# Patient Record
Sex: Female | Born: 1950 | Race: White | Hispanic: No | Marital: Married | Smoking: Former smoker
Health system: Southern US, Community
[De-identification: ages and names within clinical notes are randomized; demographics above are authoritative.]

## PROBLEM LIST (undated history)

## (undated) DIAGNOSIS — M545 Low back pain, unspecified: Secondary | ICD-10-CM

## (undated) DIAGNOSIS — R519 Headache, unspecified: Secondary | ICD-10-CM

## (undated) DIAGNOSIS — K219 Gastro-esophageal reflux disease without esophagitis: Secondary | ICD-10-CM

## (undated) DIAGNOSIS — R51 Headache: Secondary | ICD-10-CM

## (undated) DIAGNOSIS — M1990 Unspecified osteoarthritis, unspecified site: Secondary | ICD-10-CM

## (undated) DIAGNOSIS — R55 Syncope and collapse: Secondary | ICD-10-CM

## (undated) DIAGNOSIS — R9431 Abnormal electrocardiogram [ECG] [EKG]: Secondary | ICD-10-CM

## (undated) DIAGNOSIS — I10 Essential (primary) hypertension: Secondary | ICD-10-CM

## (undated) HISTORY — DX: Unspecified osteoarthritis, unspecified site: M19.90

## (undated) HISTORY — PX: TONSILLECTOMY: SUR1361

## (undated) HISTORY — DX: Low back pain, unspecified: M54.50

## (undated) HISTORY — DX: Gastro-esophageal reflux disease without esophagitis: K21.9

## (undated) HISTORY — DX: Headache, unspecified: R51.9

## (undated) HISTORY — PX: COSMETIC SURGERY: SHX468

## (undated) HISTORY — PX: OTHER SURGICAL HISTORY: SHX169

## (undated) HISTORY — DX: Essential (primary) hypertension: I10

## (undated) HISTORY — DX: Low back pain: M54.5

## (undated) HISTORY — DX: Headache: R51

## (undated) HISTORY — PX: CATARACT EXTRACTION, BILATERAL: SHX1313

## (undated) HISTORY — DX: Syncope and collapse: R55

## (undated) HISTORY — PX: BREAST EXCISIONAL BIOPSY: SUR124

## (undated) HISTORY — DX: Abnormal electrocardiogram (ECG) (EKG): R94.31

---

## 1973-05-08 HISTORY — PX: OTHER SURGICAL HISTORY: SHX169

## 1999-01-06 ENCOUNTER — Other Ambulatory Visit: Admission: RE | Admit: 1999-01-06 | Discharge: 1999-01-06 | Payer: Self-pay | Admitting: Internal Medicine

## 1999-09-14 ENCOUNTER — Emergency Department (HOSPITAL_COMMUNITY): Admission: EM | Admit: 1999-09-14 | Discharge: 1999-09-14 | Payer: Self-pay

## 1999-09-14 ENCOUNTER — Encounter: Payer: Self-pay | Admitting: Emergency Medicine

## 2002-05-14 ENCOUNTER — Other Ambulatory Visit: Admission: RE | Admit: 2002-05-14 | Discharge: 2002-05-14 | Payer: Self-pay | Admitting: Internal Medicine

## 2004-05-16 ENCOUNTER — Ambulatory Visit: Payer: Self-pay | Admitting: Internal Medicine

## 2004-06-23 ENCOUNTER — Ambulatory Visit: Payer: Self-pay | Admitting: Internal Medicine

## 2004-08-23 ENCOUNTER — Ambulatory Visit: Payer: Self-pay | Admitting: Internal Medicine

## 2004-08-30 ENCOUNTER — Ambulatory Visit: Payer: Self-pay | Admitting: Internal Medicine

## 2004-10-04 ENCOUNTER — Emergency Department (HOSPITAL_COMMUNITY): Admission: EM | Admit: 2004-10-04 | Discharge: 2004-10-05 | Payer: Self-pay | Admitting: Emergency Medicine

## 2004-10-08 ENCOUNTER — Emergency Department (HOSPITAL_COMMUNITY): Admission: EM | Admit: 2004-10-08 | Discharge: 2004-10-08 | Payer: Self-pay | Admitting: Emergency Medicine

## 2004-10-24 ENCOUNTER — Ambulatory Visit: Payer: Self-pay | Admitting: Internal Medicine

## 2004-10-26 ENCOUNTER — Ambulatory Visit: Payer: Self-pay | Admitting: Internal Medicine

## 2005-01-24 ENCOUNTER — Ambulatory Visit: Payer: Self-pay | Admitting: Internal Medicine

## 2005-03-07 ENCOUNTER — Ambulatory Visit: Payer: Self-pay | Admitting: Internal Medicine

## 2005-05-22 ENCOUNTER — Ambulatory Visit: Payer: Self-pay | Admitting: Internal Medicine

## 2005-05-26 ENCOUNTER — Encounter: Admission: RE | Admit: 2005-05-26 | Discharge: 2005-05-26 | Payer: Self-pay | Admitting: Internal Medicine

## 2005-07-26 ENCOUNTER — Ambulatory Visit: Payer: Self-pay | Admitting: Internal Medicine

## 2005-10-03 ENCOUNTER — Ambulatory Visit: Payer: Self-pay | Admitting: Internal Medicine

## 2005-11-28 ENCOUNTER — Ambulatory Visit: Payer: Self-pay | Admitting: Internal Medicine

## 2006-01-09 ENCOUNTER — Ambulatory Visit: Payer: Self-pay | Admitting: Internal Medicine

## 2006-01-10 ENCOUNTER — Ambulatory Visit: Payer: Self-pay | Admitting: Internal Medicine

## 2006-01-11 ENCOUNTER — Ambulatory Visit: Payer: Self-pay | Admitting: Internal Medicine

## 2006-03-13 ENCOUNTER — Ambulatory Visit: Payer: Self-pay | Admitting: Internal Medicine

## 2006-03-13 LAB — CONVERTED CEMR LAB
ALT: 29 units/L (ref 0–40)
AST: 22 units/L (ref 0–37)
Albumin: 3.5 g/dL (ref 3.5–5.2)
BUN: 27 mg/dL — ABNORMAL HIGH (ref 6–23)
GFR calc non Af Amer: 61 mL/min
Glomerular Filtration Rate, Af Am: 73 mL/min/{1.73_m2}
Potassium: 3.8 meq/L (ref 3.5–5.1)
Sodium: 139 meq/L (ref 135–145)
Total Bilirubin: 0.8 mg/dL (ref 0.3–1.2)

## 2006-03-16 ENCOUNTER — Ambulatory Visit: Payer: Self-pay

## 2006-04-17 ENCOUNTER — Ambulatory Visit: Payer: Self-pay | Admitting: Internal Medicine

## 2006-05-24 ENCOUNTER — Ambulatory Visit: Payer: Self-pay | Admitting: Internal Medicine

## 2006-05-30 ENCOUNTER — Ambulatory Visit: Payer: Self-pay | Admitting: Internal Medicine

## 2006-06-21 ENCOUNTER — Ambulatory Visit: Payer: Self-pay | Admitting: Internal Medicine

## 2006-09-18 ENCOUNTER — Ambulatory Visit: Payer: Self-pay | Admitting: Internal Medicine

## 2006-10-04 ENCOUNTER — Ambulatory Visit: Payer: Self-pay | Admitting: Internal Medicine

## 2006-11-02 DIAGNOSIS — R609 Edema, unspecified: Secondary | ICD-10-CM

## 2006-11-02 DIAGNOSIS — K219 Gastro-esophageal reflux disease without esophagitis: Secondary | ICD-10-CM

## 2006-11-02 DIAGNOSIS — J45909 Unspecified asthma, uncomplicated: Secondary | ICD-10-CM

## 2006-11-02 DIAGNOSIS — I10 Essential (primary) hypertension: Secondary | ICD-10-CM

## 2006-11-20 ENCOUNTER — Ambulatory Visit: Payer: Self-pay | Admitting: Internal Medicine

## 2007-01-29 ENCOUNTER — Ambulatory Visit: Payer: Self-pay | Admitting: Internal Medicine

## 2007-01-29 DIAGNOSIS — M1990 Unspecified osteoarthritis, unspecified site: Secondary | ICD-10-CM | POA: Insufficient documentation

## 2007-01-29 DIAGNOSIS — M545 Low back pain: Secondary | ICD-10-CM | POA: Insufficient documentation

## 2007-01-29 LAB — CONVERTED CEMR LAB
GFR calc Af Amer: 59 mL/min
GFR calc non Af Amer: 49 mL/min
Glucose, Bld: 95 mg/dL (ref 70–99)
Magnesium: 2 mg/dL (ref 1.5–2.5)
Potassium: 4.8 meq/L (ref 3.5–5.1)
Sodium: 143 meq/L (ref 135–145)

## 2007-02-04 ENCOUNTER — Telehealth: Payer: Self-pay | Admitting: Internal Medicine

## 2007-02-21 ENCOUNTER — Ambulatory Visit: Payer: Self-pay | Admitting: Internal Medicine

## 2007-02-22 ENCOUNTER — Encounter: Payer: Self-pay | Admitting: Internal Medicine

## 2007-02-25 ENCOUNTER — Telehealth: Payer: Self-pay | Admitting: Internal Medicine

## 2007-03-19 ENCOUNTER — Telehealth: Payer: Self-pay | Admitting: Internal Medicine

## 2007-03-21 ENCOUNTER — Telehealth: Payer: Self-pay | Admitting: Internal Medicine

## 2007-04-29 ENCOUNTER — Ambulatory Visit: Payer: Self-pay | Admitting: Internal Medicine

## 2007-06-24 ENCOUNTER — Telehealth: Payer: Self-pay | Admitting: Internal Medicine

## 2007-06-25 ENCOUNTER — Ambulatory Visit: Payer: Self-pay | Admitting: Internal Medicine

## 2007-06-26 ENCOUNTER — Telehealth: Payer: Self-pay | Admitting: *Deleted

## 2007-07-05 ENCOUNTER — Encounter: Payer: Self-pay | Admitting: Internal Medicine

## 2007-07-30 ENCOUNTER — Ambulatory Visit: Payer: Self-pay | Admitting: Internal Medicine

## 2007-07-30 ENCOUNTER — Telehealth: Payer: Self-pay | Admitting: *Deleted

## 2007-08-29 ENCOUNTER — Emergency Department (HOSPITAL_COMMUNITY): Admission: EM | Admit: 2007-08-29 | Discharge: 2007-08-29 | Payer: Self-pay | Admitting: Emergency Medicine

## 2007-09-10 ENCOUNTER — Telehealth: Payer: Self-pay | Admitting: *Deleted

## 2007-10-01 ENCOUNTER — Telehealth (INDEPENDENT_AMBULATORY_CARE_PROVIDER_SITE_OTHER): Payer: Self-pay | Admitting: *Deleted

## 2007-10-31 ENCOUNTER — Ambulatory Visit: Payer: Self-pay | Admitting: Internal Medicine

## 2007-10-31 DIAGNOSIS — I4581 Long QT syndrome: Secondary | ICD-10-CM

## 2007-11-25 ENCOUNTER — Encounter: Payer: Self-pay | Admitting: Internal Medicine

## 2008-01-03 ENCOUNTER — Ambulatory Visit: Payer: Self-pay | Admitting: Internal Medicine

## 2008-02-19 ENCOUNTER — Ambulatory Visit: Payer: Self-pay | Admitting: Internal Medicine

## 2008-04-22 ENCOUNTER — Ambulatory Visit: Payer: Self-pay | Admitting: Internal Medicine

## 2008-06-23 ENCOUNTER — Ambulatory Visit: Payer: Self-pay | Admitting: Internal Medicine

## 2008-06-23 ENCOUNTER — Encounter: Payer: Self-pay | Admitting: Internal Medicine

## 2008-06-23 ENCOUNTER — Other Ambulatory Visit: Admission: RE | Admit: 2008-06-23 | Discharge: 2008-06-23 | Payer: Self-pay | Admitting: Internal Medicine

## 2008-06-23 DIAGNOSIS — G4700 Insomnia, unspecified: Secondary | ICD-10-CM | POA: Insufficient documentation

## 2008-06-23 LAB — HM PAP SMEAR

## 2008-07-07 ENCOUNTER — Telehealth: Payer: Self-pay | Admitting: Internal Medicine

## 2008-08-04 ENCOUNTER — Ambulatory Visit: Payer: Self-pay | Admitting: Internal Medicine

## 2008-10-06 ENCOUNTER — Ambulatory Visit: Payer: Self-pay | Admitting: Internal Medicine

## 2008-10-09 ENCOUNTER — Telehealth: Payer: Self-pay | Admitting: Internal Medicine

## 2008-10-15 ENCOUNTER — Telehealth (INDEPENDENT_AMBULATORY_CARE_PROVIDER_SITE_OTHER): Payer: Self-pay | Admitting: *Deleted

## 2008-12-22 ENCOUNTER — Ambulatory Visit: Payer: Self-pay | Admitting: Internal Medicine

## 2009-04-13 ENCOUNTER — Ambulatory Visit: Payer: Self-pay | Admitting: Internal Medicine

## 2009-06-03 ENCOUNTER — Encounter: Payer: Self-pay | Admitting: Internal Medicine

## 2009-06-18 ENCOUNTER — Telehealth: Payer: Self-pay | Admitting: Internal Medicine

## 2009-07-01 ENCOUNTER — Telehealth: Payer: Self-pay | Admitting: Internal Medicine

## 2009-07-27 ENCOUNTER — Ambulatory Visit: Payer: Self-pay | Admitting: Internal Medicine

## 2009-11-01 ENCOUNTER — Ambulatory Visit: Payer: Self-pay | Admitting: Internal Medicine

## 2009-11-17 ENCOUNTER — Encounter: Payer: Self-pay | Admitting: Internal Medicine

## 2010-01-03 ENCOUNTER — Ambulatory Visit: Payer: Self-pay | Admitting: Internal Medicine

## 2010-01-03 DIAGNOSIS — R51 Headache: Secondary | ICD-10-CM

## 2010-01-03 DIAGNOSIS — F419 Anxiety disorder, unspecified: Secondary | ICD-10-CM | POA: Insufficient documentation

## 2010-02-03 ENCOUNTER — Encounter: Payer: Self-pay | Admitting: Internal Medicine

## 2010-03-09 ENCOUNTER — Ambulatory Visit: Payer: Self-pay | Admitting: Internal Medicine

## 2010-05-04 ENCOUNTER — Telehealth: Payer: Self-pay | Admitting: Internal Medicine

## 2010-05-05 ENCOUNTER — Ambulatory Visit
Admission: RE | Admit: 2010-05-05 | Discharge: 2010-05-05 | Payer: Self-pay | Source: Home / Self Care | Attending: Family Medicine | Admitting: Family Medicine

## 2010-06-05 LAB — CONVERTED CEMR LAB
Albumin: 4.2 g/dL (ref 3.5–5.2)
Alkaline Phosphatase: 76 units/L (ref 39–117)
BUN: 20 mg/dL (ref 6–23)
Basophils Absolute: 0 10*3/uL (ref 0.0–0.1)
CO2: 31 meq/L (ref 19–32)
Calcium: 9.7 mg/dL (ref 8.4–10.5)
Cholesterol: 186 mg/dL (ref 0–200)
Creatinine, Ser: 1 mg/dL (ref 0.4–1.2)
Direct LDL: 92 mg/dL
Eosinophils Absolute: 0.1 10*3/uL (ref 0.0–0.7)
Glucose, Bld: 87 mg/dL (ref 70–99)
Hemoglobin: 13.6 g/dL (ref 12.0–15.0)
Lymphocytes Relative: 22.8 % (ref 12.0–46.0)
MCHC: 34.1 g/dL (ref 30.0–36.0)
Neutro Abs: 3.7 10*3/uL (ref 1.4–7.7)
Neutrophils Relative %: 65.8 % (ref 43.0–77.0)
Platelets: 214 10*3/uL (ref 150.0–400.0)
RDW: 12.1 % (ref 11.5–14.6)
Total Bilirubin: 0.9 mg/dL (ref 0.3–1.2)

## 2010-06-06 ENCOUNTER — Ambulatory Visit
Admission: RE | Admit: 2010-06-06 | Discharge: 2010-06-06 | Payer: Self-pay | Source: Home / Self Care | Attending: Internal Medicine | Admitting: Internal Medicine

## 2010-06-09 NOTE — Progress Notes (Signed)
Summary: copy of disability papers  Phone Note Call from Patient   Caller: Patient Call For: Stacie Glaze MD Summary of Call: Per Pt request.......copy of disability papers sent to pt. Initial call taken by: Lynann Beaver CMA,  July 01, 2009 3:21 PM

## 2010-06-09 NOTE — Letter (Signed)
Summary: [facility]  [facility]   Imported By: Maryln Gottron 02/11/2010 14:54:06  _____________________________________________________________________  External Attachment:    Type:   Image     Comment:   External Document

## 2010-06-09 NOTE — Assessment & Plan Note (Signed)
Summary: 2 month rov/njr   Vital Signs:  Patient profile:   60 year old female Height:      66 inches Weight:      166 pounds BMI:     26.89 Temp:     98.2 degrees F oral Pulse rate:   72 / minute Resp:     14 per minute BP sitting:   130 / 78  (left arm)  Vitals Entered By: Willy Eddy, LPN (March 09, 2010 1:50 PM) CC: roa, Hypertension Management Is Patient Diabetic? No   Primary Care Provider:  Stacie Glaze MD  CC:  roa and Hypertension Management.  History of Present Illness: The pt has been to Dr Ethelene Hal and the consultaiton was reviewed as well as the comments on the use of narcotic pain medications... see note. She has been in a better place with her husband. medications reviewed. The pt was driving recently and was given a DUI ( breathalizer)   Hypertension History:      She denies headache, chest pain, palpitations, dyspnea with exertion, orthopnea, PND, peripheral edema, visual symptoms, neurologic problems, syncope, and side effects from treatment.        Positive major cardiovascular risk factors include female age 17 years old or older and hypertension.  Negative major cardiovascular risk factors include negative family history for ischemic heart disease and non-tobacco-user status.     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.3) 11)  Hypertension  (ICD-401.9) 12)  Gerd  (ICD-530.81)  Current Problems (verified): 1)   Headache  (ICD-784.0) 2)  Other Anxiety States  (ICD-300.09) 3)  Screening For Malignant Neoplasm of The Cervix  (ICD-V76.2) 4)  Insomnia, Chronic  (ICD-307.42) 5)  Long Qt Syndrome  (ICD-426.82) 6)  Accidental Fall On or From Sidewalk Curb  (ICD-E880.1) 7)  Osteoarthritis  (ICD-715.90) 8)  Low Back Pain  (ICD-724.2) 9)  Asthma  (ICD-493.90) 10)  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 Take 1 Tablet By Mouth Two Times A Day 2)  Benicar 40 Mg  Tabs (Olmesartan Medoxomil) .... Take 1 Tablet By Mouth Once A Day 3)  Clonazepam 1 Mg  Tabs (Clonazepam) .... 1/2 Two Times A Day and 1 Tab At Bedtime 4)  Venlafaxine Hcl 150 Mg Xr24h-Cap (Venlafaxine Hcl) .... One By Mouth  in Am   Note New Dose 5)  Lipitor 20 Mg  Tabs (Atorvastatin Calcium) .... Take 1 Tablet By Mouth Once A Day 6)  Triamterene-Hctz 37.5-25 Mg Caps (Triamterene-Hctz) .... One By Mouth Bid 7)  Nasonex 50 Mcg/act  Susp (Mometasone Furoate) .... 2 Sprays As Directed Once Daily As Needed 8)  Seroquel 25 Mg  Tabs (Quetiapine Fumarate) .Marland Kitchen.. 1 or 1/2 By Mouth Q Hs 9)  Multivitamins   Caps (Multiple Vitamin) .Marland KitchenMarland KitchenMarland Kitchen  Once Daily 10)  Nexium 40 Mg Cpdr (Esomeprazole Magnesium) .... One By Mouth Daily 11)  Hydrocodone-Acetaminophen 7.5-500 Mg Tabs (Hydrocodone-Acetaminophen) .... Take As Directed By Dr Ethelene Hal 12)  Fiorinal 50-325-40 Mg Caps (Butalbital-Aspirin-Caffeine) .... One By Mouth Q 6 Hours As Needed Head Aches  Current Medications (verified): 1)  Acebutolol Hcl 400 Mg  Caps (Acebutolol Hcl) .... Take 1 Tablet By Mouth Two Times A Day 2)  Benicar 40 Mg  Tabs (Olmesartan Medoxomil) .... Take 1 Tablet By Mouth Once A Day 3)  Clonazepam 1 Mg  Tabs (Clonazepam) .... 1/2 Two Times A Day and 1 Tab At Bedtime 4)  Venlafaxine Hcl 150 Mg Xr24h-Cap (Venlafaxine Hcl) .... One By Mouth  in Am   Note New Dose 5)  Lipitor 20 Mg   Tabs (Atorvastatin Calcium) .... Take 1 Tablet By Mouth Once A Day 6)  Triamterene-Hctz 37.5-25 Mg Caps (Triamterene-Hctz) .... One By Mouth Bid 7)  Nasonex 50 Mcg/act  Susp (Mometasone Furoate) .... 2 Sprays As Directed Once Daily As Needed 8)  Multivitamins   Caps (Multiple Vitamin) .... Once Daily 9)  Nexium 40 Mg Cpdr (Esomeprazole Magnesium) .... One By Mouth Daily 10)  Hydrocodone-Acetaminophen 7.5-500 Mg Tabs (Hydrocodone-Acetaminophen) .... Take As Directed By Dr Ethelene Hal 11)  Fiorinal 50-325-40 Mg Caps (Butalbital-Aspirin-Caffeine) .... One By Mouth Q 6 Hours As Needed Head Aches  Allergies (verified): 1)  ! Penicillin 2)  ! Steroids 3)  ! Codeine 4)  ! Benadryl 5)  ! Valium  Past History:  Family History: Last updated: 11/02/2006 Family History Hypertension Family History  cancer  Social History: Last updated: 11/02/2006 Retired Married Alcohol use-yes Drug use-no  Risk Factors: Smoking Status: never (03/09/2010) Passive Smoke Exposure: no (03/09/2010)  Past medical, surgical, family and social histories (including risk factors) reviewed, and no changes noted (except as noted below).  Past Medical History: Reviewed history from 01/29/2007 and no changes required. GERD Hypertension Prelonged QT Neurally Mediated Syncope Asthma Low back pain Osteoarthritis  Past Surgical History: Reviewed history from 01/29/2007 and no changes required. Left breast tumor removed-benign T&A TL-1980 cervical injections  Family History: Reviewed history from 11/02/2006 and no changes required. Family History Hypertension Family History  cancer  Social History: Reviewed history from 11/02/2006 and no changes required. Retired Married Alcohol use-yes Drug use-no  Review of Systems  The patient denies anorexia, fever, weight loss, weight gain, vision loss, decreased hearing, hoarseness, chest pain, syncope, dyspnea on exertion, peripheral edema, prolonged cough,  headaches, hemoptysis, abdominal pain, melena, hematochezia, severe indigestion/heartburn, hematuria, incontinence, genital sores, muscle weakness, suspicious skin lesions, transient blindness, difficulty walking, depression, unusual weight change, abnormal bleeding, enlarged lymph nodes, angioedema, and breast masses.         Flu Vaccine Consent Questions     Do you have a history of severe allergic reactions to this vaccine? no    Any prior history of allergic reactions to egg and/or gelatin? no    Do you have a sensitivity to the preservative Thimersol? no    Do you have a past history of Guillan-Barre Syndrome? no    Do you currently have an acute febrile illness? no    Have you ever had a severe reaction to latex? no    Vaccine information given and explained to patient? yes    Are you currently pregnant? no    Lot Number:[id number]   Exp Date:11/05/2010   Site Given  Left Deltoid IM   Physical Exam  General:  Well-developed,well-nourished,in no acute  distress; alert,appropriate and cooperative throughout examination Head:  Normocephalic and atraumatic without obvious abnormalities. No apparent alopecia or balding. Eyes:  pupils equal and pupils round.   Ears:  R ear normal and L ear normal.   Nose:  no external deformity and no nasal discharge.   Mouth:  Oral mucosa and oropharynx without lesions or exudates.  Teeth in good repair. Neck:  No deformities, masses, or tenderness noted. Lungs:  normal respiratory effort and no wheezes.   Heart:  normal rate and regular rhythm.   Abdomen:  Bowel sounds positive,abdomen soft and non-tender without masses, organomegaly or hernias noted. Msk:  decreased ROM, joint tenderness, joint swelling, and joint warmth.   Pulses:  R and L carotid,radial,femoral,dorsalis pedis and posterior tibial pulses are full and equal bilaterally Extremities:  No clubbing, cyanosis, edema, or deformity noted with normal full range of motion of all joints.     Neurologic:  alert & oriented X3, gait normal, and DTRs symmetrical and normal.     Impression & Recommendations:  Problem # 1:  LONG QT SYNDROME (ICD-426.82)  Problem # 2:  HEADACHE (ICD-784.0) stable Her updated medication list for this problem includes:    Acebutolol Hcl 400 Mg Caps (Acebutolol hcl) .Marland Kitchen... Take 1 tablet by mouth two times a day    Hydrocodone-acetaminophen 7.5-500 Mg Tabs (Hydrocodone-acetaminophen) .Marland Kitchen... Take as directed by dr Ethelene Hal    Fiorinal [phone] Mg Caps (Butalbital-aspirin-caffeine) ..... One by mouth q 6 hours as needed head aches  Problem # 3:  ASTHMA (ICD-493.90) stable  Problem # 4:  HYPERTENSION (ICD-401.9) Assessment: Unchanged  Her updated medication list for this problem includes:    Acebutolol Hcl 400 Mg Caps (Acebutolol hcl) .Marland Kitchen... Take 1 tablet by mouth two times a day    Benicar 40 Mg Tabs (Olmesartan medoxomil) .Marland Kitchen... Take 1 tablet by mouth once a day    Triamterene-hctz 37.5-25 Mg Caps (Triamterene-hctz) ..... One by mouth bid  BP today: 130/78 Prior BP: 130/80 (01/03/2010)  Prior 10 Yr Risk Heart Disease: Not enough information (01/29/2007)  Labs Reviewed: K+: 4.1 (04/13/2009) Creat: : 1.0 (04/13/2009)   Chol: 186 (04/13/2009)   HDL: 59.20 (04/13/2009)     Complete Medication List: 1)  Acebutolol Hcl 400 Mg Caps (Acebutolol hcl) .... Take 1 tablet by mouth two times a day 2)  Benicar 40 Mg Tabs (Olmesartan medoxomil) .... Take 1 tablet by mouth once a day 3)  Clonazepam 1 Mg Tabs (Clonazepam) .... 1/2 two times a day and 1 tab at bedtime 4)  Venlafaxine Hcl 150 Mg Xr24h-cap (Venlafaxine hcl) .... One by mouth  in am   note new dose 5)  Lipitor 20 Mg Tabs (Atorvastatin calcium) .... Take 1 tablet by mouth once a day 6)  Triamterene-hctz 37.5-25 Mg Caps (Triamterene-hctz) .... One by mouth bid 7)  Nasonex 50 Mcg/act Susp (Mometasone furoate) .... 2 sprays as directed once daily as needed 8)  Multivitamins Caps (Multiple vitamin) ....  Once daily 9)  Nexium 40 Mg Cpdr (Esomeprazole magnesium) .... One by mouth daily 10)  Hydrocodone-acetaminophen 7.5-500 Mg Tabs (Hydrocodone-acetaminophen) .... Take as directed by dr Ethelene Hal 11)  Fiorinal 50-325-40 Mg Caps (Butalbital-aspirin-caffeine) .... One by mouth q 6 hours as needed head aches  Other Orders: Admin 1st Vaccine (38182) Flu Vaccine 56yrs + (99371)  Hypertension Assessment/Plan:      The patient's hypertensive risk group is category B: At least one risk factor (excluding diabetes) with no target organ damage.  Today's blood pressure is  130/78.  Her blood pressure goal is < 140/90.  Patient Instructions: 1)  Please schedule a follow-up appointment in 3 months.   Orders Added: 1)  Admin 1st Vaccine [90471] 2)  Flu Vaccine 47yrs + [90658] 3)  Est. Patient Level IV [13086]

## 2010-06-09 NOTE — Miscellaneous (Signed)
Summary: [facility]  [facility]   Imported By: Maryln Gottron 11/29/2009 14:29:50  _____________________________________________________________________  External Attachment:    Type:   Image     Comment:   External Document

## 2010-06-09 NOTE — Assessment & Plan Note (Signed)
Summary: 3 month rov/njr rsc bmp/njr   Vital Signs:  Patient profile:   60 year old female Height:      66 inches Weight:      164 pounds BMI:     26.57 Temp:     98.2 degrees F oral Pulse rate:   72 / minute Resp:     14 per minute BP sitting:   116 / 70  (left arm)  Vitals Entered By: Willy Eddy, LPN (November 01, 2009 2:22 PM) CC: roa, Hypertension Management Is Patient Diabetic? No   CC:  roa and Hypertension Management.  History of Present Illness: has fallen twice neither episode was related to syncope both were "tripping" has been going to Dr Ethelene Hal for therapy blood pressure is stable the seroquil is not helping to sleep due to the pain she is on 800 IBU  three times a day and hydrocodone she did not tolerate oxycodone    Hypertension History:      She denies headache, chest pain, palpitations, dyspnea with exertion, orthopnea, PND, peripheral edema, visual symptoms, neurologic problems, syncope, and side effects from treatment.        Positive major cardiovascular risk factors include female age 6 years old or older and hypertension.  Negative major cardiovascular risk factors include negative family history for ischemic heart disease.     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 Take 1 Tablet By Mouth Two Times A Day 2)  Benicar 40 Mg  Tabs (Olmesartan Medoxomil) .... Take 1 Tablet By Mouth Once A Day 3)  Butalbital-Apap-Caffeine 50-500-40 Mg Tabs (Butalbital-Apap-Caffeine) .... One By Mouth Q 6 Hours Prn 4)  Clonazepam 1 Mg  Tabs (Clonazepam) .... 1/2 Two Times A Day and 1 Tab At Bedtime 5)  Venlafaxine Hcl 75 Mg Xr24h-Cap (Venlafaxine Hcl) .... One By Mouth Daily 6)  Lipitor 20 Mg  Tabs (Atorvastatin Calcium) .... Take 1 Tablet By Mouth Once A Day 7)  Triamterene-Hctz 37.5-25 Mg Caps (Triamterene-Hctz) .... One By Mouth Bid 8)  Nasonex 50 Mcg/act  Susp (Mometasone Furoate) .... 2 Sprays As Directed Once Daily As Needed 9)  Seroquel 25 Mg  Tabs (Quetiapine Fumarate) .Marland Kitchen.. 1 or 1/2 By Mouth Q Hs 10)  Tramadol Hcl 50 Mg  Tabs (Tramadol Hcl) .... Two By Mouth Two Times A Day 11)  Multivitamins   Caps (Multiple Vitamin) .... Once Daily 12)  Nexium 40 Mg Cpdr (Esomeprazole Magnesium) .... One By Mouth Daily  Current Medications (verified): 1)  Acebutolol Hcl  400 Mg  Caps (Acebutolol Hcl) .... Take 1 Tablet By Mouth Two Times A Day 2)  Benicar 40 Mg  Tabs (Olmesartan Medoxomil) .... Take 1 Tablet By Mouth Once A Day 3)  Butalbital-Apap-Caffeine 50-500-40 Mg Tabs (Butalbital-Apap-Caffeine) .... One By Mouth Q 6 Hours Prn 4)  Clonazepam 1 Mg  Tabs (Clonazepam) .... 1/2 Two Times A Day and 1 Tab At Bedtime 5)  Venlafaxine Hcl 75 Mg Xr24h-Cap (Venlafaxine Hcl) .... One By Mouth Daily 6)  Lipitor 20 Mg  Tabs (Atorvastatin Calcium) .... Take 1 Tablet By Mouth Once A Day 7)  Triamterene-Hctz 37.5-25 Mg Caps (Triamterene-Hctz) .... One By Mouth Bid 8)  Nasonex 50 Mcg/act  Susp (Mometasone Furoate) .... 2 Sprays As Directed Once  Daily As Needed 9)  Seroquel 25 Mg  Tabs (Quetiapine Fumarate) .Marland Kitchen.. 1 or 1/2 By Mouth Q Hs 10)  Tramadol Hcl 50 Mg  Tabs (Tramadol Hcl) .... Two By Mouth Two Times A Day 11)  Multivitamins   Caps (Multiple Vitamin) .... Once Daily 12)  Nexium 40 Mg Cpdr (Esomeprazole Magnesium) .... One By Mouth Daily 13)  Hydrocodone-Acetaminophen 7.5-500 Mg Tabs (Hydrocodone-Acetaminophen) .... Take As Directed By Dr Ethelene Hal  Allergies (verified): 1)  ! Penicillin 2)  ! Steroids 3)  ! Codeine 4)  ! Benadryl 5)  ! Valium  Past History:  Family History: Last updated: 11/02/2006 Family History Hypertension Family History  cancer  Social History: Last updated: 11/02/2006 Retired Married Alcohol use-yes Drug use-no  Risk Factors: Passive Smoke Exposure: no (11/01/2009)  Past medical, surgical, family and social histories (including risk factors) reviewed, and no changes noted (except as noted below).  Past Medical History: Reviewed history from 01/29/2007 and no changes required. GERD Hypertension Prelonged QT Neurally Mediated Syncope Asthma Low back pain Osteoarthritis  Past Surgical History: Reviewed history from 01/29/2007 and no changes required. Left breast tumor removed-benign T&A TL-1980 cervical injections  Family History: Reviewed history from 11/02/2006 and no changes required. Family History Hypertension Family History  cancer  Social History: Reviewed history from 11/02/2006 and no changes required. Retired Married Alcohol use-yes Drug use-no  Review of Systems  The patient denies anorexia, fever, weight loss, weight gain, vision loss, decreased hearing, hoarseness, chest pain, syncope, dyspnea on exertion, peripheral edema, prolonged cough, headaches, hemoptysis, abdominal pain, melena, hematochezia, severe indigestion/heartburn, hematuria, incontinence, genital sores, muscle weakness, suspicious skin lesions, transient blindness, difficulty walking,  depression, unusual weight change, abnormal bleeding, enlarged lymph nodes, angioedema, and breast masses.         back pain falls   Physical Exam  General:  Well-developed,well-nourished,in no acute distress; alert,appropriate and cooperative throughout examination Head:  Normocephalic and atraumatic without obvious abnormalities. No apparent alopecia or balding. Eyes:  pupils equal and pupils round.   Ears:  R ear normal and L ear normal.   Nose:  no external deformity and no nasal discharge.   Mouth:  Oral mucosa and oropharynx without lesions or exudates.  Teeth in good repair. Neck:  No deformities, masses, or tenderness noted. Lungs:  normal respiratory effort and no wheezes.   Heart:  normal rate and regular rhythm.   Abdomen:  Bowel sounds positive,abdomen soft and non-tender without masses, organomegaly or hernias noted. Msk:  decreased ROM, joint tenderness, joint swelling, and joint warmth.   Extremities:  No clubbing, cyanosis, edema, or deformity noted with normal full range of motion of all joints.   Neurologic:  alert & oriented X3, gait normal, and DTRs symmetrical and normal.  Impression & Recommendations:  Problem # 1:  LOW BACK PAIN (ICD-724.2)  falls from trippping not syncope with increased pan and consieration for epidural Her updated medication list for this problem includes:    Butalbital-apap-caffeine 50-500-40 Mg Tabs (Butalbital-apap-caffeine) ..... One by mouth q 6 hours prn    Tramadol Hcl 50 Mg Tabs (Tramadol hcl) .Marland Kitchen..Marland Kitchen Two by mouth two times a day    Hydrocodone-acetaminophen 7.5-500 Mg Tabs (Hydrocodone-acetaminophen) .Marland Kitchen... Take as directed by dr Ethelene Hal  Discussed use of moist heat or ice, modified activities, medications, and stretching/strengthening exercises. Back care instructions given. To be seen in 2 weeks if no improvement; sooner if worsening of symptoms.   Orders: Physical Therapy Referral (PT)  Problem # 2:  LONG QT SYNDROME  (ICD-426.82) no new syncopy  Problem # 3:  HYPERTENSION (ICD-401.9) stable Her updated medication list for this problem includes:    Acebutolol Hcl 400 Mg Caps (Acebutolol hcl) .Marland Kitchen... Take 1 tablet by mouth two times a day    Benicar 40 Mg Tabs (Olmesartan medoxomil) .Marland Kitchen... Take 1 tablet by mouth once a day    Triamterene-hctz 37.5-25 Mg Caps (Triamterene-hctz) ..... One by mouth bid  BP today: 116/70 Prior BP: 130/80 (07/27/2009)  Prior 10 Yr Risk Heart Disease: Not enough information (01/29/2007)  Labs Reviewed: K+: 4.1 (04/13/2009) Creat: : 1.0 (04/13/2009)   Chol: 186 (04/13/2009)   HDL: 59.20 (04/13/2009)     Problem # 4:  GERD (ICD-530.81) stable Her updated medication list for this problem includes:    Nexium 40 Mg Cpdr (Esomeprazole magnesium) ..... One by mouth daily  Labs Reviewed: Hgb: 13.6 (04/13/2009)   Hct: 39.9 (04/13/2009)  Complete Medication List: 1)  Acebutolol Hcl 400 Mg Caps (Acebutolol hcl) .... Take 1 tablet by mouth two times a day 2)  Benicar 40 Mg Tabs (Olmesartan medoxomil) .... Take 1 tablet by mouth once a day 3)  Butalbital-apap-caffeine 50-500-40 Mg Tabs (Butalbital-apap-caffeine) .... One by mouth q 6 hours prn 4)  Clonazepam 1 Mg Tabs (Clonazepam) .... 1/2 two times a day and 1 tab at bedtime 5)  Venlafaxine Hcl 75 Mg Xr24h-cap (Venlafaxine hcl) .... One by mouth daily 6)  Lipitor 20 Mg Tabs (Atorvastatin calcium) .... Take 1 tablet by mouth once a day 7)  Triamterene-hctz 37.5-25 Mg Caps (Triamterene-hctz) .... One by mouth bid 8)  Nasonex 50 Mcg/act Susp (Mometasone furoate) .... 2 sprays as directed once daily as needed 9)  Seroquel 25 Mg Tabs (Quetiapine fumarate) .Marland Kitchen.. 1 or 1/2 by mouth q hs 10)  Tramadol Hcl 50 Mg Tabs (Tramadol hcl) .... Two by mouth two times a day 11)  Multivitamins Caps (Multiple vitamin) .... Once daily 12)  Nexium 40 Mg Cpdr (Esomeprazole magnesium) .... One by mouth daily 13)  Hydrocodone-acetaminophen 7.5-500 Mg Tabs  (Hydrocodone-acetaminophen) .... Take as directed by dr Ethelene Hal  Hypertension Assessment/Plan:      The patient's hypertensive risk group is category B: At least one risk factor (excluding diabetes) with no target organ damage.  Today's blood pressure is 116/70.  Her blood pressure goal is < 140/90.  Patient Instructions: 1)  Please schedule a follow-up appointment in 3 months. 2)  increase the seroquil to one tablet ( 25mg ) 3)  consideration for PT evaluation

## 2010-06-09 NOTE — Progress Notes (Signed)
Summary: Pt req refill of Nexium and recommendation for otc med for uri  Phone Note Refill Request Call back at Home Phone [phone] Message from:  Patient on May 04, 2010 11:46 AM  Refills Requested: Medication #1:  NEXIUM 40 MG CPDR one by mouth daily   Supply Requested: 3 months Pt req refill of Nexium. Pt only has 3 pills lft.  Pt also has an upper respiratory inf, and was wondering what OTC med would be recommended. [facility] [address].    Method Requested: [facility] [address] [facility] Initial call taken by: Lucy Antigua,  May 04, 2010 11:46 AM  Follow-up for Phone Call        Rx called to pharmacy Follow-up by: Alfred Levins, CMA,  May 04, 2010 4:55 PM    Prescriptions: NEXIUM 40 MG CPDR (ESOMEPRAZOLE MAGNESIUM) one by mouth daily  #30 x 3   Entered by:   Alfred Levins, CMA   Authorized by:   Stacie Glaze MD   Signed by:   Alfred Levins, CMA on 05/04/2010   Method used:   [facility]ronically to        [facility]  [phone]* (retail)       [address]       [place], [place]  [address]       Ph: [phone] or [phone]       Fax: [phone]   RxID:   [id number]

## 2010-06-09 NOTE — Assessment & Plan Note (Signed)
Summary: 2 month rov.nrj   Vital Signs:  Patient profile:   60 year old female Height:      66 inches Weight:      164 pounds BMI:     26.57 Temp:     98.2 degrees F oral Pulse rate:   72 / minute Resp:     14 per minute BP sitting:   130 / 80  (left arm)  Vitals Entered By: Willy Eddy, LPN (January 03, 2010 1:29 PM) CC: roa- wants to discuss meds, Hypertension Management Is Patient Diabetic? No   CC:  roa- wants to discuss meds and Hypertension Management.  History of Present Illness: follow up visit for HTN and GERD with hx of prolong QT has been working with ART at PT neck pain as improved, paus ein PT with plans to resume in the fall with consideration for accupucture migraines have been better with less use of nasids or narcotics has increased seroquil and the AM head aches have increased the effexor may be causes day time sonolence her panic attacks have subsided   Hypertension History:      She denies headache, chest pain, palpitations, dyspnea with exertion, orthopnea, PND, peripheral edema, visual symptoms, neurologic problems, syncope, and side effects from treatment.        Positive major cardiovascular risk factors include female age 31 years old or older and hypertension.  Negative major cardiovascular risk factors include negative family history for ischemic heart disease and non-tobacco-user status.     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 Take 1 Tablet By Mouth Two Times A Day 2)  Benicar 40 Mg  Tabs (Olmesartan Medoxomil) .... Take 1 Tablet By Mouth Once A Day 3)  Butalbital-Apap-Caffeine 50-500-40 Mg Tabs (Butalbital-Apap-Caffeine) .... One By Mouth Q 6 Hours Prn 4)  Clonazepam 1 Mg  Tabs (Clonazepam) .... 1/2 Two Times A Day and 1 Tab At Bedtime 5)  Venlafaxine Hcl 75 Mg Xr24h-Cap (Venlafaxine Hcl) .... One By Mouth Daily 6)  Lipitor 20 Mg  Tabs (Atorvastatin Calcium) .... Take 1 Tablet By Mouth Once A Day 7)  Triamterene-Hctz 37.5-25 Mg Caps (Triamterene-Hctz) .... One By Mouth Bid 8)  Nasonex 50 Mcg/act  Susp (Mometasone Furoate) .... 2 Sprays As Directed Once Daily As Needed 9)  Seroquel 25 Mg  Tabs (Quetiapine Fumarate) .Marland Kitchen.. 1 or 1/2 By Mouth Q Hs 10)  Tramadol Hcl 50 Mg  Tabs (Tramadol Hcl) .... Two By Mouth Two Times  A Day 11)  Multivitamins   Caps (Multiple Vitamin) .... Once Daily 12)  Nexium 40 Mg Cpdr (Esomeprazole Magnesium) .... One By Mouth Daily 13)  Hydrocodone-Acetaminophen 7.5-500 Mg Tabs (Hydrocodone-Acetaminophen) .... Take As Directed By Dr Ethelene Hal  Current Medications (verified): 1)  Acebutolol Hcl 400 Mg  Caps (Acebutolol Hcl) .... Take 1 Tablet By Mouth Two Times A Day 2)  Benicar 40 Mg  Tabs (Olmesartan Medoxomil) .... Take 1 Tablet By Mouth Once A Day 3)  Butalbital-Apap-Caffeine 50-500-40 Mg Tabs (Butalbital-Apap-Caffeine) .... One By Mouth Q 6 Hours Prn 4)  Clonazepam 1 Mg  Tabs (Clonazepam) .... 1/2 Two Times A Day and 1 Tab At Bedtime 5)  Venlafaxine Hcl  75 Mg Xr24h-Cap (Venlafaxine Hcl) .... One By Mouth Daily 6)  Lipitor 20 Mg  Tabs (Atorvastatin Calcium) .... Take 1 Tablet By Mouth Once A Day 7)  Triamterene-Hctz 37.5-25 Mg Caps (Triamterene-Hctz) .... One By Mouth Bid 8)  Nasonex 50 Mcg/act  Susp (Mometasone Furoate) .... 2 Sprays As Directed Once Daily As Needed 9)  Seroquel 25 Mg  Tabs (Quetiapine Fumarate) .Marland Kitchen.. 1 or 1/2 By Mouth Q Hs 10)  Tramadol Hcl 50 Mg  Tabs (Tramadol Hcl) .... Two By Mouth Two Times A Day 11)  Multivitamins   Caps (Multiple Vitamin) .... Once Daily 12)  Nexium 40 Mg Cpdr (Esomeprazole Magnesium) .... One By Mouth Daily 13)  Hydrocodone-Acetaminophen 7.5-500 Mg Tabs (Hydrocodone-Acetaminophen) .... Take As Directed By Dr Ethelene Hal  Allergies (verified): 1)  ! Penicillin 2)  ! Steroids 3)  ! Codeine 4)  ! Benadryl 5)  ! Valium  Past History:  Family History: Last updated: 11/02/2006 Family History Hypertension Family History  cancer  Social History: Last updated: 11/02/2006 Retired Married Alcohol use-yes Drug use-no  Risk Factors: Smoking Status: never (01/03/2010) Passive Smoke Exposure: no (01/03/2010)  Past medical, surgical, family and social histories (including risk factors) reviewed, and no changes noted (except as noted below).  Past Medical History: Reviewed history from 01/29/2007 and no changes required. GERD Hypertension Prelonged QT Neurally Mediated Syncope Asthma Low back pain Osteoarthritis  Past Surgical History: Reviewed history from 01/29/2007 and no changes required. Left breast tumor removed-benign T&A TL-1980 cervical injections  Family History: Reviewed history from 11/02/2006 and no changes required. Family History Hypertension Family History  cancer  Social History: Reviewed history from 11/02/2006 and no changes required. Retired Married Alcohol use-yes Drug use-no Smoking Status:  never  Review of Systems  The patient denies anorexia, fever,  weight loss, weight gain, vision loss, decreased hearing, hoarseness, chest pain, syncope, dyspnea on exertion, peripheral edema, prolonged cough, headaches, hemoptysis, abdominal pain, melena, hematochezia, severe indigestion/heartburn, hematuria, incontinence, genital sores, muscle weakness, suspicious skin lesions, transient blindness, difficulty walking, depression, unusual weight change, abnormal bleeding, enlarged lymph nodes, angioedema, and breast masses.    Physical Exam  General:  Well-developed,well-nourished,in no acute distress; alert,appropriate and cooperative throughout examination Head:  Normocephalic and atraumatic without obvious abnormalities. No apparent alopecia or balding. Eyes:  pupils equal and pupils round.   Ears:  R ear normal and L ear normal.   Nose:  no external deformity and no nasal discharge.   Mouth:  Oral mucosa and oropharynx without lesions or exudates.  Teeth in good repair. Neck:  No deformities, masses, or tenderness noted. Lungs:  normal respiratory effort and no wheezes.   Heart:  normal rate and regular rhythm.   Abdomen:  Bowel sounds positive,abdomen soft and non-tender without masses, organomegaly or hernias noted. Msk:  decreased ROM, joint tenderness, joint swelling, and joint warmth.   Extremities:  No clubbing, cyanosis, edema, or deformity noted with normal full range of motion of all joints.     Impression & Recommendations:  Problem # 1:  HYPERTENSION (ICD-401.9)  Her updated medication list for this problem includes:    Acebutolol Hcl 400 Mg Caps (Acebutolol hcl) .Marland Kitchen... Take 1 tablet by mouth two times a day    Benicar 40 Mg Tabs (Olmesartan medoxomil) .Marland Kitchen... Take 1 tablet by mouth once a day    Triamterene-hctz 37.5-25 Mg Caps (Triamterene-hctz) ..... One by mouth bid  BP today: 130/80 Prior BP: 116/70 (11/01/2009)  Prior 10 Yr Risk Heart Disease: Not enough information (01/29/2007)  Labs Reviewed: K+: 4.1 (04/13/2009) Creat: :  1.0 (04/13/2009)   Chol: 186 (04/13/2009)   HDL: 59.20 (04/13/2009)     Problem # 2:  LONG QT SYNDROME (ICD-426.82) no increased syncope  Problem # 3:  OTHER ANXIETY STATES (ICD-300.09) Assessment: Unchanged  Her updated medication list for this problem includes:    Clonazepam 1 Mg Tabs (Clonazepam) .Marland Kitchen... 1/2 two times a day and 1 tab at bedtime    Venlafaxine Hcl 150 Mg Xr24h-cap (Venlafaxine hcl) ..... One by mouth  in am   note new dose the pt has been on seroquil as   added medicine re3sume the 1/2 25 mg seroquil and  increase the effexor 75 to 150  Problem # 4:  HEADACHE (ICD-784.0) Assessment: Unchanged discussion of pain control  The following medications were removed from the medication list:    Butalbital-apap-caffeine 50-500-40 Mg Tabs (Butalbital-apap-caffeine) ..... One by mouth q 6 hours prn    Tramadol Hcl 50 Mg Tabs (Tramadol hcl) .Marland Kitchen..Marland Kitchen Two by mouth two times a day Her updated medication list for this problem includes:    Acebutolol Hcl 400 Mg Caps (Acebutolol hcl) .Marland Kitchen... Take 1 tablet by mouth two times a day    Hydrocodone-acetaminophen 7.5-500 Mg Tabs (Hydrocodone-acetaminophen) .Marland Kitchen... Take as directed by dr Ethelene Hal    Fiorinal [phone] Mg Caps (Butalbital-aspirin-caffeine) ..... One by mouth q 6 hours as needed head aches  Headache diary reviewed.  Complete Medication List: 1)  Acebutolol Hcl 400 Mg Caps (Acebutolol hcl) .... Take 1 tablet by mouth two times a day 2)  Benicar 40 Mg Tabs (Olmesartan medoxomil) .... Take 1 tablet by mouth once a day 3)  Clonazepam 1 Mg Tabs (Clonazepam) .... 1/2 two times a day and 1 tab at bedtime 4)  Venlafaxine Hcl 150 Mg Xr24h-cap (Venlafaxine hcl) .... One by mouth  in am   note new dose 5)  Lipitor 20 Mg Tabs (Atorvastatin calcium) .... Take 1 tablet by mouth once a day 6)  Triamterene-hctz 37.5-25 Mg Caps (Triamterene-hctz) .... One by mouth bid 7)  Nasonex 50 Mcg/act Susp (Mometasone furoate) .... 2 sprays as directed once  daily as needed 8)  Seroquel 25 Mg Tabs (Quetiapine fumarate) .Marland Kitchen.. 1 or 1/2 by mouth q hs 9)  Multivitamins Caps (Multiple vitamin) .... Once daily 10)  Nexium 40 Mg Cpdr (Esomeprazole magnesium) .... One by mouth daily 11)  Hydrocodone-acetaminophen 7.5-500 Mg Tabs (Hydrocodone-acetaminophen) .... Take as directed by dr Ethelene Hal 12)  Fiorinal 50-325-40 Mg Caps (Butalbital-aspirin-caffeine) .... One by mouth q 6 hours as needed head aches  Hypertension Assessment/Plan:      The patient's hypertensive risk group is category B: At least one risk factor (excluding diabetes) with no target organ damage.  Today's blood pressure is 130/80.  Her blood  pressure goal is < 140/90.  Patient Instructions: 1)  Please schedule a follow-up appointment in 2 months. Prescriptions: FIORINAL 50-325-40 MG CAPS (BUTALBITAL-ASPIRIN-CAFFEINE) one by mouth q 6 hours as needed head aches  #180 x 3   Entered and Authorized by:   Stacie Glaze MD   Signed by:   Stacie Glaze MD on 01/03/2010   Method used:   Print then Give to Patient   RxID:   [id number] VENLAFAXINE HCL 150 MG XR24H-CAP (VENLAFAXINE HCL) one by mouth  in AM   note new dose  #90 x 1   Entered and Authorized by:   Stacie Glaze MD   Signed by:   Stacie Glaze MD on 01/03/2010   Method used:   Electronically to        [facility]. [phone]* (retail)       [address]       [place], [place]  [address]       Ph: [phone]       Fax: [phone]   RxID:   [id number]

## 2010-06-09 NOTE — Assessment & Plan Note (Signed)
Summary: 3 month rov./njr today is pt birthday/njr   Vital Signs:  Patient profile:   60 year old female Height:      66 inches Weight:      158 pounds BMI:     25.59 Temp:     98.2 degrees F oral Pulse rate:   72 / minute Resp:     14 per minute BP sitting:   130 / 80  (left arm)  Vitals Entered By: Willy Eddy, LPN (July 27, 2009 1:36 PM)  Nutrition Counseling: Patient's BMI is greater than 25 and therefore counseled on weight management options. CC: roa, Hypertension Management   CC:  roa and Hypertension Management.  History of Present Illness: Follow up visit pt has  increased panic and anxiety attacks has had post prandial nausea and has been vomiting occasionally she has  hx of GERD her asthma has been stable  Hypertension History:      She denies headache, chest pain, palpitations, dyspnea with exertion, orthopnea, PND, peripheral edema, visual symptoms, neurologic problems, syncope, and side effects from treatment.        Positive major cardiovascular risk factors include female age 66 years old or older and hypertension.  Negative major cardiovascular risk factors include negative family history for ischemic heart disease.     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 Take 1 Tablet By Mouth Two Times A Day 2)  Benicar 40 Mg  Tabs (Olmesartan Medoxomil) .... Take 1 Tablet By Mouth Once A Day 3)  Esgic 50-325-40 Mg Caps (Butalbital-Apap-Caffeine) .Marland Kitchen.. 1 Q 8 Hours As Needed Headache 4)  Clonazepam 1 Mg  Tabs (Clonazepam) .... 1/2 Two Times A Day and 1 Tab At Bedtime 5)  Effexor 75 Mg  Tabs (Venlafaxine Hcl) .... Once Daily 6)  Lipitor 20 Mg  Tabs (Atorvastatin Calcium) .... Take 1 Tablet By Mouth Once A Day 7)  Triamterene-Hctz 37.5-25 Mg Caps (Triamterene-Hctz) .... One By Mouth Daily 8)  Nasonex 50 Mcg/act  Susp (Mometasone Furoate) .... 2 Sprays As Directed Once Daily As Needed 9)  Seroquel 25 Mg  Tabs (Quetiapine Fumarate) .... 1/2 Tab At Bedtime 10)  Tramadol Hcl 50 Mg  Tabs (Tramadol Hcl) .... Two By Mouth Two Times A Day 11)  Multivitamins   Caps (Multiple Vitamin) .... Once Daily 12)  Ranitidine Hcl 300 Mg Caps (Ranitidine Hcl) .... One By Mouth Daily  Current Medications (verified): 1)  Acebutolol Hcl 400 Mg  Caps (Acebutolol Hcl) .... Take 1 Tablet By Mouth  Two Times A Day 2)  Benicar 40 Mg  Tabs (Olmesartan Medoxomil) .... Take 1 Tablet By Mouth Once A Day 3)  Esgic 50-325-40 Mg Caps (Butalbital-Apap-Caffeine) .Marland Kitchen.. 1 Q 8 Hours As Needed Headache 4)  Clonazepam 1 Mg  Tabs (Clonazepam) .... 1/2 Two Times A Day and 1 Tab At Bedtime 5)  Effexor 75 Mg  Tabs (Venlafaxine Hcl) .... Once Daily 6)  Lipitor 20 Mg  Tabs (Atorvastatin Calcium) .... Take 1 Tablet By Mouth Once A Day 7)  Triamterene-Hctz 37.5-25 Mg Caps (Triamterene-Hctz) .... One By Mouth Daily 8)  Nasonex 50 Mcg/act  Susp (Mometasone Furoate) .... 2 Sprays As Directed Once Daily As Needed 9)  Seroquel 25 Mg  Tabs (Quetiapine Fumarate) .... 1/2 Tab At  Bedtime 10)  Tramadol Hcl 50 Mg  Tabs (Tramadol Hcl) .... Two By Mouth Two Times A Day 11)  Multivitamins   Caps (Multiple Vitamin) .... Once Daily 12)  Ranitidine Hcl 300 Mg Caps (Ranitidine Hcl) .... One By Mouth Daily  Allergies (verified): 1)  ! Penicillin 2)  ! Steroids 3)  ! Codeine 4)  ! Benadryl 5)  ! Valium  Past History:  Family History: Last updated: 11/02/2006 Family History Hypertension Family History  cancer  Social History: Last updated: 11/02/2006 Retired Married Alcohol use-yes Drug use-no  Risk Factors: Passive Smoke Exposure: no (07/27/2009)  Past medical, surgical, family and social histories (including risk factors) reviewed, and no changes noted (except as noted below).  Past Medical History: Reviewed history from 01/29/2007 and no changes required. GERD Hypertension Prelonged QT Neurally Mediated Syncope Asthma Low back pain Osteoarthritis  Past Surgical History: Reviewed history from 01/29/2007 and no changes required. Left breast tumor removed-benign T&A TL-1980 cervical injections  Family History: Reviewed history from 11/02/2006 and no changes required. Family History Hypertension Family History  cancer  Social History: Reviewed history from 11/02/2006 and no changes required. Retired Married Alcohol use-yes Drug use-no  Review of Systems  The patient denies anorexia, fever, weight loss, weight gain, vision loss, decreased hearing, hoarseness, chest pain, syncope, dyspnea on exertion, peripheral edema, prolonged cough, headaches, hemoptysis, abdominal pain, melena, hematochezia, severe indigestion/heartburn, hematuria, incontinence, genital sores, muscle weakness, suspicious skin lesions, transient blindness, difficulty walking, depression, unusual weight change, abnormal bleeding, enlarged lymph nodes, angioedema, and breast masses.    Physical Exam  General:  Well-developed,well-nourished,in no acute distress;  alert,appropriate and cooperative throughout examination Head:  Normocephalic and atraumatic without obvious abnormalities. No apparent alopecia or balding. Eyes:  pupils equal and pupils round.   Ears:  R ear normal and L ear normal.   Nose:  no external deformity and no nasal discharge.   Mouth:  Oral mucosa and oropharynx without lesions or exudates.  Teeth in good repair. Neck:  No deformities, masses, or tenderness noted. Lungs:  normal respiratory effort and no wheezes.   Heart:  normal rate and regular rhythm.   Abdomen:  Bowel sounds positive,abdomen soft and non-tender without masses, organomegaly or hernias noted. Msk:  decreased ROM, joint tenderness, joint swelling, and joint warmth.   Pulses:  R and L carotid,radial,femoral,dorsalis pedis and posterior tibial pulses are full and equal bilaterally Extremities:  No clubbing, cyanosis, edema, or deformity noted with normal full range of motion of all joints.     Impression & Recommendations:  Problem # 1:  LONG QT SYNDROME (ICD-426.82) stable  Problem # 2:  HYPERTENSION (ICD-401.9) Assessment: Unchanged  Her updated medication list for this problem includes:    Acebutolol Hcl  400 Mg Caps (Acebutolol hcl) .Marland Kitchen... Take 1 tablet by mouth two times a day    Benicar 40 Mg Tabs (Olmesartan medoxomil) .Marland Kitchen... Take 1 tablet by mouth once a day    Triamterene-hctz 37.5-25 Mg Caps (Triamterene-hctz) ..... One by mouth daily  BP today: 130/80 Prior BP: 114/76 (04/13/2009)  Prior 10 Yr Risk Heart Disease: Not enough information (01/29/2007)  Labs Reviewed: K+: 4.1 (04/13/2009) Creat: : 1.0 (04/13/2009)   Chol: 186 (04/13/2009)   HDL: 59.20 (04/13/2009)     Problem # 3:  HYPERTENSION (ICD-401.9)  Her updated medication list for this problem includes:    Acebutolol Hcl 400 Mg Caps (Acebutolol hcl) .Marland Kitchen... Take 1 tablet by mouth two times a day    Benicar 40 Mg Tabs (Olmesartan medoxomil) .Marland Kitchen... Take 1 tablet by mouth once a day     Triamterene-hctz 37.5-25 Mg Caps (Triamterene-hctz) ..... One by mouth daily  BP today: 130/80 Prior BP: 114/76 (04/13/2009)  Prior 10 Yr Risk Heart Disease: Not enough information (01/29/2007)  Labs Reviewed: K+: 4.1 (04/13/2009) Creat: : 1.0 (04/13/2009)   Chol: 186 (04/13/2009)   HDL: 59.20 (04/13/2009)     Problem # 4:  GERD (ICD-530.81)  The following medications were removed from the medication list:    Ranitidine Hcl 300 Mg Caps (Ranitidine hcl) ..... One by mouth daily Her updated medication list for this problem includes:    Nexium 40 Mg Cpdr (Esomeprazole magnesium) ..... One by mouth daily  Labs Reviewed: Hgb: 13.6 (04/13/2009)   Hct: 39.9 (04/13/2009)  Complete Medication List: 1)  Acebutolol Hcl 400 Mg Caps (Acebutolol hcl) .... Take 1 tablet by mouth two times a day 2)  Benicar 40 Mg Tabs (Olmesartan medoxomil) .... Take 1 tablet by mouth once a day 3)  Butalbital-apap-caffeine 50-500-40 Mg Tabs (Butalbital-apap-caffeine) .... One by mouth q 6 hours prn 4)  Clonazepam 1 Mg Tabs (Clonazepam) .... 1/2 two times a day and 1 tab at bedtime 5)  Venlafaxine Hcl 75 Mg Xr24h-cap (Venlafaxine hcl) .... One by mouth daily 6)  Lipitor 20 Mg Tabs (Atorvastatin calcium) .... Take 1 tablet by mouth once a day 7)  Triamterene-hctz 37.5-25 Mg Caps (Triamterene-hctz) .... One by mouth daily 8)  Nasonex 50 Mcg/act Susp (Mometasone furoate) .... 2 sprays as directed once daily as needed 9)  Seroquel 25 Mg Tabs (Quetiapine fumarate) .Marland Kitchen.. 1 or 1/2 by mouth q hs 10)  Tramadol Hcl 50 Mg Tabs (Tramadol hcl) .... Two by mouth two times a day 11)  Multivitamins Caps (Multiple vitamin) .... Once daily 12)  Nexium 40 Mg Cpdr (Esomeprazole magnesium) .... One by mouth daily  Hypertension Assessment/Plan:      The patient's hypertensive risk group is category B: At least one risk factor (excluding diabetes) with no target organ damage.  Today's blood pressure is 130/80.  Her blood pressure goal  is < 140/90.  Patient Instructions: 1)  1. change the  300 mg zantac to the 40 mg nexium 2)  2. then we are changing the effexor to the timed release 3)  3.if the panic continue take two of the effexor in the AM and call and we will increase the dose to 150 XR 4)  Please schedule a follow-up appointment in 3 months. Prescriptions: SEROQUEL 25 MG  TABS (QUETIAPINE FUMARATE) 1 or 1/2 by mouth q HS  #90 x 3   Entered and Authorized by:   Stacie Glaze MD   Signed by:   Stacie Glaze MD  on 07/27/2009   Method used:   Electronically to        [facility]  [address] [phone]* (retail)       [address]       [place], [place]  [address]       Ph: [phone] or [phone]       Fax: [phone]   RxID:   [id number] VENLAFAXINE HCL 75 MG XR24H-CAP (VENLAFAXINE HCL) one by mouth daily  #90 x 3   Entered and Authorized by:   Stacie Glaze MD   Signed by:   Stacie Glaze MD on 07/27/2009   Method used:   Electronically to        [facility]  [address] [phone]* (retail)       [address]       [place], [place]  [address]       Ph: [phone] or [phone]       Fax: [phone]   RxID:   [id number] NEXIUM 40 MG CPDR (ESOMEPRAZOLE MAGNESIUM) one by mouth daily  #30 x 11   Entered and Authorized by:   Stacie Glaze MD   Signed by:   Stacie Glaze MD on 07/27/2009   Method used:   Electronically to        [facility] [phone]* (retail)       [address]       [place], [place]  [address]       Ph: [phone]       Fax: [phone]   RxID:   [id number]

## 2010-06-09 NOTE — Assessment & Plan Note (Signed)
Summary: URI/dm   Vital Signs:  Patient profile:   60 year old female Weight:      169 pounds Temp:     97.3 degrees F oral BP sitting:   120 / 80  (left arm) Cuff size:   regular  Vitals Entered By: Duard Brady LPN (May 05, 2010 4:46 PM) CC: c/o chest congestion and panic attack , ?sore throat , URI symptoms Is Patient Diabetic? No   History of Present Illness:       This is a 60 year old woman who presents with URI symptoms.  onset of symptoms about 4 days ago.  The patient complains of nasal congestion, clear nasal discharge, sore throat, dry cough, earache, and sick contacts, but denies purulent nasal discharge.  The patient denies fever, stiff neck, dyspnea, and wheezing.  The patient also reports muscle aches.  The patient denies headache.  The patient denies the following risk factors for Strep sinusitis: unilateral facial pain, double sickening, and tooth pain.    Allergies: 1)  ! Penicillin 2)  ! Steroids 3)  ! Codeine 4)  ! Benadryl 5)  ! Valium  Past History:  Past Medical History: Last updated: 01/29/2007 GERD Hypertension Prelonged QT Neurally Mediated Syncope Asthma Low back pain Osteoarthritis PMH reviewed for relevance  Review of Systems      See HPI  Physical Exam  General:  Well-developed,well-nourished,in no acute distress; alert,appropriate and cooperative throughout examination Ears:  External ear exam shows no significant lesions or deformities.  Otoscopic examination reveals clear canals, tympanic membranes are intact bilaterally without bulging, retraction, inflammation or discharge. Hearing is grossly normal bilaterally. Mouth:  Oral mucosa and oropharynx without lesions or exudates.  Teeth in good repair. Neck:  No deformities, masses, or tenderness noted. Lungs:  Normal respiratory effort, chest expands symmetrically. Lungs are clear to auscultation, no crackles or wheezes. Heart:  normal rate and regular rhythm.   Skin:  no  rashes.   Cervical Nodes:  No lymphadenopathy noted   Impression & Recommendations:  Problem # 1:  VIRAL URI (ICD-465.9) cough med for symptom relief.  Follow up prn.  Her updated medication list for this problem includes:    Hydrocodone-homatropine 5-1.5 Mg/46ml Syrp (Hydrocodone-homatropine) ..... One tsp by mouth q 4-6 hours as needed cough  Complete Medication List: 1)  Acebutolol Hcl 400 Mg Caps (Acebutolol hcl) .... Take 1 tablet by mouth two times a day 2)  Benicar 40 Mg Tabs (Olmesartan medoxomil) .... Take 1 tablet by mouth once a day 3)  Clonazepam 1 Mg Tabs (Clonazepam) .... 1/2 two times a day and 1 tab at bedtime 4)  Venlafaxine Hcl 150 Mg Xr24h-cap (Venlafaxine hcl) .... One by mouth  in am   note new dose 5)  Lipitor 20 Mg Tabs (Atorvastatin calcium) .... Take 1 tablet by mouth once a day 6)  Triamterene-hctz 37.5-25 Mg Caps (Triamterene-hctz) .... One by mouth bid 7)  Nasonex 50 Mcg/act Susp (Mometasone furoate) .... 2 sprays as directed once daily as needed 8)  Multivitamins Caps (Multiple vitamin) .... Once daily 9)  Nexium 40 Mg Cpdr (Esomeprazole magnesium) .... One by mouth daily 10)  Hydrocodone-acetaminophen 7.5-500 Mg Tabs (Hydrocodone-acetaminophen) .... Take as directed by dr Ethelene Hal 11)  Fiorinal 50-325-40 Mg Caps (Butalbital-aspirin-caffeine) .... One by mouth q 6 hours as needed head aches 12)  Hydrocodone-homatropine 5-1.5 Mg/51ml Syrp (Hydrocodone-homatropine) .... One tsp by mouth q 4-6 hours as needed cough  Patient Instructions: 1)  Acute Bronchitis symptoms for less  then 10 days are not  helped by antibiotics. Take over the counter cough medications. Call if no improvement in 5-7 days, sooner if increasing cough, fever, or new symptoms ( shortness of breath, chest pain) .  Prescriptions: HYDROCODONE-HOMATROPINE 5-1.5 MG/5ML SYRP (HYDROCODONE-HOMATROPINE) one tsp by mouth q 4-6 hours as needed cough  #120 ml x 0   Entered and Authorized by:   Evelena Peat  MD   Signed by:   Evelena Peat MD on 05/05/2010   Method used:   Print then Give to Patient   RxID:   [id number]    Orders Added: 1)  Est. Patient Level III [[address]]

## 2010-06-09 NOTE — Progress Notes (Signed)
Summary: rxs to new drugstore needed  Phone Note Call from Patient Call back at Home Phone [phone]   Complaint: Urinary/GYN Problems Summary of Call: Insurance has changed & has to get meds from [facility]  Needs all meds 68mo supply  to be sent to [facility] Fiorinal butalbital/asa  and caff caps, Ranitidine, Benicar 40mg , Lipitor, Tramadol, Venlafaxine Hcl 75, TrimatrinHCT, Seroquil, Acedutolol gen sectral.   Please call ifany questions.  Remember [birth year] is my [birth year] & I have my CPX that day.  There needs to be something up and diamonds. Initial call taken by: Rudy Jew, RN,  June 18, 2009 3:07 PM  Follow-up for Phone Call        t Follow-up by: Willy Eddy, LPN,  June 18, 2009 3:29 PM    Prescriptions: RANITIDINE HCL 300 MG CAPS (RANITIDINE HCL) one by mouth daily  #90 x 3   Entered by:   Willy Eddy, LPN   Authorized by:   Stacie Glaze MD   Signed by:   Willy Eddy, LPN on 09/81/1914   Method used:   [facility] to        [facility] [phone]* (retail)       [address]       [place], [place]  [address]       Ph: [phone] or [phone]       Fax: [phone]   RxID:   [id number] TRAMADOL HCL 50 MG  TABS (TRAMADOL HCL) two by mouth two times a day  #240 x 3   Entered by:   Willy Eddy, LPN   Authorized by:   Stacie Glaze MD   Signed by:   Willy Eddy, LPN on 56/43/3295   Method used:   [facility]ronically to        [facility] [phone]* (retail)       [address]       [place], [place]  [address]       Ph: [phone] or [phone]       Fax: [phone]   RxID:   [id number] SEROQUEL 25 MG  TABS (QUETIAPINE FUMARATE) 1/2 tab at bedtime  #45 x 3   Entered by:   Willy Eddy, LPN   Authorized by:   Stacie Glaze MD   Signed by:   Willy Eddy, LPN on 26/94/8546   Method used:   Electronically to        [facility] [phone]*  (retail)       [address]       [place], [place]  [address]       Ph: [phone] or [phone]       Fax: [phone]   RxID:   [id number] TRIAMTERENE-HCTZ 37.5-25 MG CAPS (TRIAMTERENE-HCTZ) one by mouth daily  #90 x 3   Entered by:   Willy Eddy, LPN   Authorized by:   Stacie Glaze MD   Signed by:   Willy Eddy, LPN on 00/86/7619   Method used:   Electronically to        [facility] [phone]* (retail)       [address]       [place], [place]  [address]       Ph: [phone] or [phone]       Fax: [phone]  RxID:   [id number] LIPITOR 20 MG  TABS (ATORVASTATIN CALCIUM) Take 1 tablet by mouth once a day  #90 x 3   Entered by:   Willy Eddy, LPN   Authorized by:   Stacie Glaze MD   Signed by:   Willy Eddy, LPN on 25/36/6440   Method used:   [facility]ronically to        [facility] [phone]* (retail)       [address]       [place], [place]  [address]       Ph: [phone] or [phone]       Fax: [phone]   RxID:   [id number] EFFEXOR 75 MG  TABS (VENLAFAXINE HCL) once daily  #90 x 3   Entered by:   Willy Eddy, LPN   Authorized by:   Stacie Glaze MD   Signed by:   Willy Eddy, LPN on 37/62/8315   Method used:   [facility]ronically to        [facility] [phone]* (retail)       [address]       [place], [place]  [address]       Ph: [phone] or [phone]       Fax: [phone]   RxID:   [id number] ESGIC 50-325-40 MG CAPS (BUTALBITAL-APAP-CAFFEINE) 1 q 8 hours as needed headache  #30 x 3   Entered by:   Willy Eddy, LPN   Authorized by:   Stacie Glaze MD   Signed by:   Willy Eddy, LPN on 85/27/7824   Method used:   [facility]ronically to        [facility] [phone]* (retail)       [address]       [place], [place]  [address]       Ph: [phone] or [phone]       Fax: [phone]   RxID:   [id number] BENICAR 40 MG  TABS  (OLMESARTAN MEDOXOMIL) Take 1 tablet by mouth once a day  #90 x 3   Entered by:   Willy Eddy, LPN   Authorized by:   Stacie Glaze MD   Signed by:   Willy Eddy, LPN on 02/40/9735   Method used:   Electronically to        [facility] [phone]* (retail)       [address]       [place], [place]  [address]       Ph: [phone] or [phone]       Fax: [phone]   RxID:   [id number] ACEBUTOLOL HCL 400 MG  CAPS (ACEBUTOLOL HCL) Take 1 tablet by mouth two times a day  #180 x 3   Entered by:   Willy Eddy, LPN   Authorized by:   Stacie Glaze MD   Signed by:   Willy Eddy, LPN on 02/77/4128   Method used:   [facility]ronically to        [facility] [phone]* (retail)       [address]       [place], [place]  [address]       Ph: [phone] or [phone]       Fax: [phone]   RxID:   [id number]   Appended Document: rxs to new drugstore needed done

## 2010-06-09 NOTE — Letter (Signed)
Summary: Attending Physician's Statement/[employer]  Attending Physician's Statement/[employer]   Imported By: Maryln Gottron 06/07/2009 14:47:07  _____________________________________________________________________  External Attachment:    Type:   Image     Comment:   External Document

## 2010-06-15 NOTE — Assessment & Plan Note (Signed)
Summary: 3 month rov/njr/pt rsc/cjr   Vital Signs:  Patient profile:   60 year old female Height:      66 inches Weight:      166 pounds BMI:     26.89 Temp:     98.1 degrees F oral Pulse rate:   80 / minute Resp:     14 per minute BP sitting:   110 / 70  (left arm)  Vitals Entered By: Willy Eddy, LPN (June 06, 2010 2:02 PM) CC: roa, Hypertension Management Is Patient Diabetic? No   Primary Care Provider:  Stacie Glaze MD  CC:  roa and Hypertension Management.  History of Present Illness: The [facility] days She had to go to Rehab per the courts episodes of dizzyness and lightheadedness no LOC episodes are primarily possitional   Hypertension History:      She denies headache, chest pain, palpitations, dyspnea with exertion, orthopnea, PND, peripheral edema, visual symptoms, neurologic problems, syncope, and side effects from treatment.        Positive major cardiovascular risk factors include female age 29 years old or older and hypertension.  Negative major cardiovascular risk factors include negative family history for ischemic heart disease and non-tobacco-user status.     Preventive Screening-Counseling & Management  Alcohol-Tobacco     Alcohol drinks/day: 0     Alcohol Counseling: to decrease amount and/or frequency of alcohol intake     Smoking Status: never     Passive Smoke Exposure: no     Tobacco Counseling: not indicated; no tobacco use  Problems Prior to Update: 1)  Headache  (ICD-784.0) 2)  Other Anxiety States  (ICD-300.09) 3)  Screening For Malignant Neoplasm of The Cervix  (ICD-V76.2) 4)  Insomnia, Chronic  (ICD-307.42) 5)  Long Qt Syndrome  (ICD-426.82) 6)  Accidental Fall On or From Sidewalk Curb  (ICD-E880.1) 7)  Osteoarthritis  (ICD-715.90) 8)  Low Back Pain  (ICD-724.2) 9)  Asthma  (ICD-493.90) 10)  Edema Leg  (ICD-782.3) 11)  Hypertension  (ICD-401.9) 12)  Gerd  (ICD-530.81)  Current  Problems (verified): 1)  Headache  (ICD-784.0) 2)  Other Anxiety States  (ICD-300.09) 3)  Screening For Malignant Neoplasm of The Cervix  (ICD-V76.2) 4)  Insomnia, Chronic  (ICD-307.42) 5)  Long Qt Syndrome  (ICD-426.82) 6)  Accidental Fall On or From Sidewalk Curb  (ICD-E880.1) 7)  Osteoarthritis  (ICD-715.90) 8)  Low Back Pain  (ICD-724.2) 9)  Asthma  (ICD-493.90) 10)  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 Take 1 Tablet By Mouth Two Times A Day 2)  Benicar 40 Mg  Tabs (Olmesartan Medoxomil) .... Take 1 Tablet By Mouth Once A Day 3)  Clonazepam 1 Mg  Tabs (Clonazepam) .... 1/2 Two Times A Day and 1 Tab At Bedtime 4)  Venlafaxine Hcl 150 Mg Xr24h-Cap (Venlafaxine Hcl) .... One By Mouth  in Am   Note New Dose 5)  Lipitor 20 Mg  Tabs (Atorvastatin Calcium) .... Take 1 Tablet By Mouth Once A Day 6)  Triamterene-Hctz 37.5-25 Mg Caps (Triamterene-Hctz) .... One By Mouth Bid 7)  Nasonex 50 Mcg/act  Susp (Mometasone Furoate) .... 2 Sprays As Directed Once Daily As Needed 8)  Multivitamins   Caps (Multiple Vitamin) .... Once Daily 9)  Nexium 40 Mg Cpdr (Esomeprazole Magnesium) .Marland KitchenMarland KitchenMarland Kitchen  One By Mouth Daily 10)  Hydrocodone-Acetaminophen 7.5-500 Mg Tabs (Hydrocodone-Acetaminophen) .... Take As Directed By Dr Ethelene Hal 11)  Fiorinal 50-325-40 Mg Caps (Butalbital-Aspirin-Caffeine) .... One By Mouth Q 6 Hours As Needed Head Aches 12)  Hydrocodone-Homatropine 5-1.5 Mg/1ml Syrp (Hydrocodone-Homatropine) .... One Tsp By Mouth Q 4-6 Hours As Needed Cough  Current Medications (verified): 1)  Acebutolol Hcl 400 Mg  Caps (Acebutolol Hcl) .... Take 1 Tablet By Mouth Two Times A Day 2)  Benicar 40 Mg  Tabs (Olmesartan Medoxomil) .... Take 1 Tablet By Mouth Once A Day 3)  Clonazepam 1 Mg  Tabs (Clonazepam) .... 1/2 Two Times A Day and 1 Tab At Bedtime 4)  Venlafaxine Hcl 150 Mg Xr24h-Cap  (Venlafaxine Hcl) .... One By Mouth  in Am   Note New Dose 5)  Lipitor 20 Mg  Tabs (Atorvastatin Calcium) .... Take 1 Tablet By Mouth Once A Day 6)  Triamterene-Hctz 37.5-25 Mg Caps (Triamterene-Hctz) .... One By Mouth Bid 7)  Nasonex 50 Mcg/act  Susp (Mometasone Furoate) .... 2 Sprays As Directed Once Daily As Needed 8)  Multivitamins   Caps (Multiple Vitamin) .... Once Daily 9)  Nexium 40 Mg Cpdr (Esomeprazole Magnesium) .... One By Mouth Daily 10)  Hydrocodone-Acetaminophen 7.5-500 Mg Tabs (Hydrocodone-Acetaminophen) .... Take As Directed By Dr Ethelene Hal 11)  Fiorinal 50-325-40 Mg Caps (Butalbital-Aspirin-Caffeine) .... One By Mouth Q 6 Hours As Needed Head Aches 12)  Hydrocodone-Homatropine 5-1.5 Mg/48ml Syrp (Hydrocodone-Homatropine) .... One Tsp By Mouth Q 4-6 Hours As Needed Cough  Allergies (verified): 1)  ! Penicillin 2)  ! Steroids 3)  ! Codeine 4)  ! Benadryl 5)  ! Valium  Past History:  Family History: Last updated: 11/02/2006 Family History Hypertension Family History  cancer  Social History: Last updated: 11/02/2006 Retired Married Alcohol use-yes Drug use-no  Risk Factors: Smoking Status: never (06/06/2010) Passive Smoke Exposure: no (06/06/2010)  Past medical, surgical, family and social histories (including risk factors) reviewed, and no changes noted (except as noted below).  Past Medical History: Reviewed history from 01/29/2007 and no changes required. GERD Hypertension Prelonged QT Neurally Mediated Syncope Asthma Low back pain Osteoarthritis  Past Surgical History: Reviewed history from 01/29/2007 and no changes required. Left breast tumor removed-benign T&A TL-1980 cervical injections  Family History: Reviewed history from 11/02/2006 and no changes required. Family History Hypertension Family History  cancer  Social History: Reviewed history from 11/02/2006 and no changes required. Retired Married Alcohol use-yes Drug use-no  Review  of Systems       The patient complains of depression.  The patient denies anorexia, fever, weight loss, weight gain, vision loss, decreased hearing, hoarseness, chest pain, syncope, dyspnea on exertion, peripheral edema, prolonged cough, headaches, hemoptysis, abdominal pain, melena, hematochezia, severe indigestion/heartburn, hematuria, incontinence, genital sores, muscle weakness, suspicious skin lesions, transient blindness, difficulty walking, unusual weight change, abnormal bleeding, enlarged lymph nodes, angioedema, and breast masses.    Physical Exam  General:  Well-developed,well-nourished,in no acute distress; alert,appropriate and cooperative throughout examination Head:  Normocephalic and atraumatic without obvious abnormalities. No apparent alopecia or balding. Eyes:  pupils equal and pupils round.   Ears:  External ear exam shows no significant lesions or deformities.  Otoscopic examination reveals clear canals, tympanic membranes are intact bilaterally without bulging, retraction, inflammation or discharge. Hearing is grossly normal bilaterally. Nose:  no external deformity and no nasal discharge.   Mouth:  Oral mucosa and oropharynx without lesions or exudates.  Teeth in good repair. Neck:  No deformities, masses, or tenderness  noted. Lungs:  Normal respiratory effort, chest expands symmetrically. Lungs are clear to auscultation, no crackles or wheezes. Heart:  normal rate and regular rhythm.   Abdomen:  Bowel sounds positive,abdomen soft and non-tender without masses, organomegaly or hernias noted. Msk:  decreased ROM, joint tenderness, joint swelling, and joint warmth.   Pulses:  R and L carotid,radial,femoral,dorsalis pedis and posterior tibial pulses are full and equal bilaterally Extremities:  No clubbing, cyanosis, edema, or deformity noted with normal full range of motion of all joints.   Neurologic:  alert & oriented X3, gait normal, and DTRs symmetrical and normal.      Impression & Recommendations:  Problem # 1:  LONG QT SYNDROME (ICD-426.82) Assessment Unchanged no recent syncope, remains disabled dude to the syncope risks... has persistnat dizzyness and positional orthostatis..  has noted falls  but not from syncopy  Problem # 2:  LOW BACK PAIN (ICD-724.2) Assessment: Unchanged stable... discussion of the effect of the opiod and the alcohol Her updated medication list for this problem includes:    Hydrocodone-ibuprofen 7.5-200 Mg Tabs (Hydrocodone-ibuprofen) ..... One by mouth every 6 hours    Fiorinal 50-325-40 Mg Caps (Butalbital-aspirin-caffeine) ..... One by mouth q 6 hours as needed head aches  Discussed use of moist heat or ice, modified activities, medications, and stretching/strengthening exercises. Back care instructions given. To be seen in 2 weeks if no improvement; sooner if worsening of symptoms.   Problem # 3:  HYPERTENSION (ICD-401.9)  Her updated medication list for this problem includes:    Acebutolol Hcl 400 Mg Caps (Acebutolol hcl) .Marland Kitchen... Take 1 tablet by mouth two times a day    Benicar 40 Mg Tabs (Olmesartan medoxomil) .Marland Kitchen... Take 1 tablet by mouth once a day    Triamterene-hctz 37.5-25 Mg Caps (Triamterene-hctz) ..... One by mouth bid  BP today: 110/70 Prior BP: 120/80 (05/05/2010)  Prior 10 Yr Risk Heart Disease: Not enough information (01/29/2007)  Labs Reviewed: K+: 4.1 (04/13/2009) Creat: : 1.0 (04/13/2009)   Chol: 186 (04/13/2009)   HDL: 59.20 (04/13/2009)     Problem # 4:  GERD (ICD-530.81)  Her updated medication list for this problem includes:    Nexium 40 Mg Cpdr (Esomeprazole magnesium) ..... One by mouth daily  Labs Reviewed: Hgb: 13.6 (04/13/2009)   Hct: 39.9 (04/13/2009)  Complete Medication List: 1)  Acebutolol Hcl 400 Mg Caps (Acebutolol hcl) .... Take 1 tablet by mouth two times a day 2)  Benicar 40 Mg Tabs (Olmesartan medoxomil) .... Take 1 tablet by mouth once a day 3)  Clonazepam 1 Mg Tabs  (Clonazepam) .... 1/2 two times a day and 1 tab at bedtime 4)  Venlafaxine Hcl 150 Mg Xr24h-cap (Venlafaxine hcl) .... One by mouth  in am   note new dose 5)  Lipitor 20 Mg Tabs (Atorvastatin calcium) .... Take 1 tablet by mouth once a day 6)  Triamterene-hctz 37.5-25 Mg Caps (Triamterene-hctz) .... One by mouth bid 7)  Nasonex 50 Mcg/act Susp (Mometasone furoate) .... 2 sprays as directed once daily as needed 8)  Multivitamins Caps (Multiple vitamin) .... Once daily 9)  Nexium 40 Mg Cpdr (Esomeprazole magnesium) .... One by mouth daily 10)  Hydrocodone-ibuprofen 7.5-200 Mg Tabs (Hydrocodone-ibuprofen) .... One by mouth every 6 hours 11)  Fiorinal 50-325-40 Mg Caps (Butalbital-aspirin-caffeine) .... One by mouth q 6 hours as needed head aches 12)  Hydrocodone-homatropine 5-1.5 Mg/77ml Syrp (Hydrocodone-homatropine) .... One tsp by mouth q 4-6 hours as needed cough  Hypertension Assessment/Plan:  The patient's hypertensive risk group is category B: At least one risk factor (excluding diabetes) with no target organ damage.  Today's blood pressure is 110/70.  Her blood pressure goal is < 140/90.  Patient Instructions: 1)  Please schedule a follow-up appointment in 4 months. Prescriptions: NEXIUM 40 MG CPDR (ESOMEPRAZOLE MAGNESIUM) one by mouth daily  #90 x 3   Entered and Authorized by:   Stacie Glaze MD   Signed by:   Stacie Glaze MD on 06/06/2010   Method used:   Electronically to        [facility]. [phone]* (retail)       [address]       [place], [place]  [address]       Ph: [phone]       Fax: [phone]   RxID:   [id number] VENLAFAXINE HCL 150 MG XR24H-CAP (VENLAFAXINE HCL) one by mouth  in AM   note new dose  #90 x 6   Entered and Authorized by:   Stacie Glaze MD   Signed by:   Stacie Glaze MD on 06/06/2010   Method used:   Electronically to        [facility]. [phone]* (retail)       [address]        [place], [place]  [address]       Ph: [phone]       Fax: [phone]   RxID:   [id number]    Orders Added: 1)  Est. Patient Level IV [82993]

## 2010-07-07 ENCOUNTER — Other Ambulatory Visit: Payer: Self-pay | Admitting: Internal Medicine

## 2010-07-31 ENCOUNTER — Other Ambulatory Visit: Payer: Self-pay | Admitting: Internal Medicine

## 2010-08-09 ENCOUNTER — Other Ambulatory Visit: Payer: Self-pay | Admitting: Internal Medicine

## 2010-08-19 ENCOUNTER — Encounter: Payer: Self-pay | Admitting: Internal Medicine

## 2010-08-19 ENCOUNTER — Other Ambulatory Visit: Payer: Self-pay | Admitting: *Deleted

## 2010-08-29 ENCOUNTER — Ambulatory Visit: Payer: Self-pay | Admitting: Internal Medicine

## 2010-09-16 ENCOUNTER — Ambulatory Visit (INDEPENDENT_AMBULATORY_CARE_PROVIDER_SITE_OTHER): Payer: Medicare Other | Admitting: Internal Medicine

## 2010-09-16 ENCOUNTER — Encounter: Payer: Self-pay | Admitting: Internal Medicine

## 2010-09-16 VITALS — BP 130/80 | HR 76 | Temp 98.2°F | Resp 14 | Ht 63.0 in | Wt 168.0 lb

## 2010-09-16 DIAGNOSIS — I10 Essential (primary) hypertension: Secondary | ICD-10-CM

## 2010-09-16 DIAGNOSIS — G4700 Insomnia, unspecified: Secondary | ICD-10-CM

## 2010-09-16 DIAGNOSIS — I4581 Long QT syndrome: Secondary | ICD-10-CM

## 2010-09-16 MED ORDER — MELATONIN-PYRIDOXINE 3-1 MG PO TABS: 1.0000 | ORAL_TABLET | Freq: Every evening | ORAL | Status: DC | PRN

## 2010-09-16 MED ORDER — VENLAFAXINE HCL ER 150 MG PO CP24: 150.0000 mg | ORAL_CAPSULE | Freq: Every day | ORAL | Status: DC

## 2010-09-16 NOTE — Progress Notes (Signed)
  Subjective:    Patient ID: Dawn Howard, female    DOB: 07-26-1950, 60 y.o.   MRN: [id number]  HPI Off of her Effexor Worse sleeping habits Vary pressured speech Increased stress due to grand child    Review of Systems  Constitutional: Negative for activity change, appetite change and fatigue.  HENT: Negative for ear pain, congestion, neck pain, postnasal drip and sinus pressure.   Eyes: Negative for redness and visual disturbance.  Respiratory: Negative for cough, shortness of breath and wheezing.   Gastrointestinal: Negative for abdominal pain and abdominal distention.  Genitourinary: Negative for dysuria, frequency and menstrual problem.  Musculoskeletal: Negative for myalgias, joint swelling and arthralgias.  Skin: Negative for rash and wound.  Neurological: Negative for dizziness, weakness and headaches.  Hematological: Negative for adenopathy. Does not bruise/bleed easily.  Psychiatric/Behavioral: Negative for sleep disturbance and decreased concentration.       Past Medical History  Diagnosis Date  . GERD (gastroesophageal reflux disease)   . Hypertension   . 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 Syncope   . Asthma   . Low back pain   . Arthritis    Past Surgical History  Procedure Date  . Left breast tumor removed- benign   . Tonsillectomy   . Cervical injections     reports that she has never smoked. She does not have any smokeless tobacco history on file. She reports that she drinks alcohol. She reports that she does not use illicit drugs. family history includes COPD in her mother; Heart disease in her mother; and Hypertension in her mother. Allergies  Allergen Reactions  . Codeine   . Diazepam   . Diphenhydramine Hcl   . Penicillins     Objective:   Physical Exam  Constitutional: She is oriented to person, place, and time. She appears well-developed and well-nourished. No distress.  HENT:  Head: Normocephalic and atraumatic.  Right Ear: External  ear normal.  Left Ear: External ear normal.  Nose: Nose normal.  Mouth/Throat: Oropharynx is clear and moist.  Eyes: Conjunctivae and EOM are normal. Pupils are equal, round, and reactive to light.  Neck: Normal range of motion. Neck supple. No JVD present. No tracheal deviation present. No thyromegaly present.  Cardiovascular: Normal rate, regular rhythm, normal heart sounds and intact distal pulses.   No murmur heard. Pulmonary/Chest: Effort normal and breath sounds normal. She has no wheezes. She exhibits no tenderness.  Abdominal: Soft. Bowel sounds are normal.  Musculoskeletal: Normal range of motion. She exhibits no edema and no tenderness.  Lymphadenopathy:    She has no cervical adenopathy.  Neurological: She is alert and oriented to person, place, and time. She has normal reflexes. No cranial nerve deficit.  Skin: Skin is warm and dry. She is not diaphoretic.          Assessment & Plan:  Resume the effexor She needs to be on an antidepressant and I believe the Effexor has been the best tolerated with a prolonged QT syndrome. Her hypertension is well controlled She has not had any syncopal episodes since her last office visit Her osteoarthritis is stable She has increased insomnia since she's been off her Effexor
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
## 2010-10-12 ENCOUNTER — Other Ambulatory Visit: Payer: Self-pay | Admitting: Internal Medicine

## 2010-12-21 ENCOUNTER — Ambulatory Visit (INDEPENDENT_AMBULATORY_CARE_PROVIDER_SITE_OTHER): Payer: Medicare Other | Admitting: Internal Medicine

## 2010-12-21 ENCOUNTER — Encounter: Payer: Self-pay | Admitting: Internal Medicine

## 2010-12-21 VITALS — BP 110/70 | HR 76 | Temp 98.0°F | Resp 16 | Ht 63.0 in | Wt 173.0 lb

## 2010-12-21 DIAGNOSIS — E785 Hyperlipidemia, unspecified: Secondary | ICD-10-CM

## 2010-12-21 DIAGNOSIS — R51 Headache: Secondary | ICD-10-CM

## 2010-12-21 DIAGNOSIS — I4581 Long QT syndrome: Secondary | ICD-10-CM

## 2010-12-21 DIAGNOSIS — I10 Essential (primary) hypertension: Secondary | ICD-10-CM

## 2010-12-21 DIAGNOSIS — F411 Generalized anxiety disorder: Secondary | ICD-10-CM

## 2010-12-21 LAB — BASIC METABOLIC PANEL
BUN: 38 mg/dL — ABNORMAL HIGH (ref 6–23)
Creatinine, Ser: 1.4 mg/dL — ABNORMAL HIGH (ref 0.4–1.2)
GFR: 39.15 mL/min — ABNORMAL LOW (ref >60.00)
Potassium: 4.6 mEq/L (ref 3.5–5.1)

## 2010-12-21 LAB — MAGNESIUM: Magnesium: 2.1 mg/dL (ref 1.5–2.5)

## 2010-12-21 LAB — TSH: TSH: 1.57 u[IU]/mL (ref 0.35–5.50)

## 2010-12-21 NOTE — Progress Notes (Signed)
  Subjective:    Patient ID: Dawn Howard, female    DOB: [birth year], 60 y.o.   MRN: [id number]  HPI The pt has prolonged QT and does not report any recent syncopy. Her GERD is controlled She has increased cramps in leg ( on maxide) She has good blood pressure control Monitoring of lipids   Review of Systems  Constitutional: Negative for activity change, appetite change and fatigue.  HENT: Negative for ear pain, congestion, neck pain, postnasal drip and sinus pressure.   Eyes: Negative for redness and visual disturbance.  Respiratory: Negative for cough, shortness of breath and wheezing.   Gastrointestinal: Negative for abdominal pain and abdominal distention.  Genitourinary: Negative for dysuria, frequency and menstrual problem.  Musculoskeletal: Negative for myalgias, joint swelling and arthralgias.  Skin: Negative for rash and wound.  Neurological: Negative for dizziness, weakness and headaches.  Hematological: Negative for adenopathy. Does not bruise/bleed easily.  Psychiatric/Behavioral: Negative for sleep disturbance and decreased concentration.   Past Medical History  Diagnosis Date  . GERD (gastroesophageal reflux disease)   . Hypertension   . 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 Syncope   . Asthma   . Low back pain   . Arthritis    Past Surgical History  Procedure Date  . Left breast tumor removed- benign   . Tonsillectomy   . Cervical injections     reports that she has never smoked. She does not have any smokeless tobacco history on file. She reports that she drinks alcohol. She reports that she does not use illicit drugs. family history includes COPD in her mother; Heart disease in her mother; and Hypertension in her mother. Allergies  Allergen Reactions  . Codeine   . Diazepam   . Diphenhydramine Hcl   . Penicillins        Objective:   Physical Exam  Nursing note and vitals reviewed. Constitutional: She is oriented to person, place, and time. She  appears well-developed and well-nourished. No distress.  HENT:  Head: Normocephalic and atraumatic.  Right Ear: External ear normal.  Left Ear: External ear normal.  Nose: Nose normal.  Mouth/Throat: Oropharynx is clear and moist.  Eyes: Conjunctivae and EOM are normal. Pupils are equal, round, and reactive to light.  Neck: Normal range of motion. Neck supple. No JVD present. No tracheal deviation present. No thyromegaly present.  Cardiovascular: Normal rate, regular rhythm, normal heart sounds and intact distal pulses.   No murmur heard. Pulmonary/Chest: Effort normal and breath sounds normal. She has no wheezes. She exhibits no tenderness.  Abdominal: Soft. Bowel sounds are normal.  Musculoskeletal: Normal range of motion. She exhibits no edema and no tenderness.  Lymphadenopathy:    She has no cervical adenopathy.  Neurological: She is alert and oriented to person, place, and time. She has normal reflexes. No cranial nerve deficit.  Skin: Skin is warm and dry. She is not diaphoretic.  Psychiatric: She has a normal mood and affect. Her behavior is normal.          Assessment & Plan:  screening for lipids. K and Mg as contributors to leg crams reviewed side effects of lipitor GERD stable and samples of nexium given Monitoring renal Stable HTN, GERD and hx of syncopy

## 2010-12-28 ENCOUNTER — Telehealth: Payer: Self-pay

## 2010-12-28 MED ORDER — VENLAFAXINE HCL 75 MG PO TABS: 75.0000 mg | ORAL_TABLET | Freq: Two times a day (BID) | ORAL | Status: DC

## 2010-12-28 NOTE — Telephone Encounter (Signed)
Labs given a nd effexor changed to 75 mg tab bid an d sent in

## 2010-12-28 NOTE — Telephone Encounter (Signed)
Pt requesting lab results and also requesting to go back to the effexor tablets instead of the capsules. She states that she and Dr. Lovell Sheehan have discussed this and he knows that she halfs he meds and she can't half a capsule. Pt would like it in a 150mg  if it comes in 150, otherwise she will take the 75 mg tablets again. Please Advise

## 2011-03-01 ENCOUNTER — Other Ambulatory Visit: Payer: Self-pay | Admitting: Internal Medicine

## 2011-03-02 ENCOUNTER — Other Ambulatory Visit: Payer: Self-pay | Admitting: *Deleted

## 2011-03-02 MED ORDER — CLONAZEPAM 1 MG PO TABS: ORAL_TABLET | ORAL | Status: DC

## 2011-03-24 ENCOUNTER — Encounter: Payer: Self-pay | Admitting: Internal Medicine

## 2011-03-24 ENCOUNTER — Ambulatory Visit (INDEPENDENT_AMBULATORY_CARE_PROVIDER_SITE_OTHER): Payer: Medicare Other | Admitting: Internal Medicine

## 2011-03-24 VITALS — BP 140/80 | HR 76 | Temp 98.1°F | Resp 16 | Ht 66.0 in | Wt 173.0 lb

## 2011-03-24 DIAGNOSIS — I10 Essential (primary) hypertension: Secondary | ICD-10-CM

## 2011-03-24 DIAGNOSIS — F411 Generalized anxiety disorder: Secondary | ICD-10-CM

## 2011-03-24 DIAGNOSIS — I4581 Long QT syndrome: Secondary | ICD-10-CM

## 2011-03-24 DIAGNOSIS — M1990 Unspecified osteoarthritis, unspecified site: Secondary | ICD-10-CM

## 2011-03-24 DIAGNOSIS — J45909 Unspecified asthma, uncomplicated: Secondary | ICD-10-CM

## 2011-03-24 MED ORDER — AMLODIPINE-OLMESARTAN 5-20 MG PO TABS: 1.0000 | ORAL_TABLET | Freq: Every day | ORAL | Status: DC

## 2011-03-24 NOTE — Patient Instructions (Signed)
The patient is instructed to continue all medications as prescribed. Schedule followup with check out clerk upon leaving the clinic  

## 2011-03-24 NOTE — Progress Notes (Signed)
Subjective:    Patient ID: Dawn Howard, female    DOB: 04-Jan-1951, 60 y.o.   MRN: [id number]  HPI  Increased frustration with loss of drivers license. No reports of syncopy Increased GERD HTN stable Mood... Unstable with increased paranoid thoughts    Review of Systems  Constitutional: Negative for activity change, appetite change and fatigue.  HENT: Negative for ear pain, congestion, neck pain, postnasal drip and sinus pressure.   Eyes: Negative for redness and visual disturbance.  Respiratory: Negative for cough, shortness of breath and wheezing.   Gastrointestinal: Negative for abdominal pain and abdominal distention.  Genitourinary: Negative for dysuria, frequency and menstrual problem.  Musculoskeletal: Negative for myalgias, joint swelling and arthralgias.  Skin: Negative for rash and wound.  Neurological: Negative for dizziness, weakness and headaches.  Hematological: Negative for adenopathy. Does not bruise/bleed easily.  Psychiatric/Behavioral: Negative for sleep disturbance and decreased concentration.   Past Medical History  Diagnosis Date  . GERD (gastroesophageal reflux disease)   . Hypertension   . 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 Syncope   . Asthma   . Low back pain   . Arthritis     History   Social History  . Marital Status: Married    Spouse Name: N/A    Number of Children: N/A  . Years of Education: N/A   Occupational History  . retired    Social History Main Topics  . Smoking status: Never Smoker   . Smokeless tobacco: Not on file  . Alcohol Use: Yes  . Drug Use: No  . Sexually Active: Not on file   Other Topics Concern  . Not on file   Social History Narrative  . No narrative on file    Past Surgical History  Procedure Date  . Left breast tumor removed- benign   . Tonsillectomy   . Cervical injections     Family History  Problem Relation Age of Onset  . COPD Mother   . Hypertension Mother   . Heart disease Mother      Allergies  Allergen Reactions  . Codeine   . Diazepam   . Diphenhydramine Hcl   . Penicillins     Current Outpatient Prescriptions on File Prior to Visit  Medication Sig Dispense Refill  . acebutolol (SECTRAL) 400 MG capsule TAKE 1 CAPSULE TWICE A DAY  180 capsule  1  . atorvastatin (LIPITOR) 20 MG tablet TAKE 1 TABLET EVERY DAY  90 tablet  3  . butalbital-acetaminophen-caffeine (ESGIC PLUS) 50-500-40 MG per tablet Take 1 tablet by mouth every 4 (four) hours as needed.        . clonazePAM (KLONOPIN) 1 MG tablet 1/2 two times a day and 1 tab a bedtime  180 tablet  1  . HYDROcodone-ibuprofen (VICOPROFEN) 7.5-200 MG per tablet Take 1 tablet by mouth every 8 (eight) hours as needed.        . Melatonin-Pyridoxine (MELATIN) 3-1 MG TABS Take 1 capsule by mouth at bedtime as needed.    0  . mometasone (NASONEX) 50 MCG/ACT nasal spray 2 sprays by Nasal route daily as needed.        . multivitamin (THERAGRAN) per tablet Take 1 tablet by mouth daily.        Marland Kitchen NEXIUM 40 MG capsule TAKE ONE BY MOUTH DAILY  90 capsule  3  . olmesartan (BENICAR) 40 MG tablet Take 40 mg by mouth daily.        Marland Kitchen triamterene-hydrochlorothiazide (MAXZIDE-25) 37.5-25  MG per tablet Take 1 tablet by mouth 2 (two) times daily.        Marland Kitchen venlafaxine (EFFEXOR) 75 MG tablet Take 75 mg by mouth 2 (two) times daily.        Marland Kitchen venlafaxine (EFFEXOR) 75 MG tablet Take 1 tablet (75 mg total) by mouth 2 (two) times daily.  180 tablet  1    BP 140/80  Pulse 76  Temp 98.1 F (36.7 C)  Resp 16  Ht 5\' 6"  (1.676 m)  Wt 173 lb (78.472 kg)  BMI 27.92 kg/m2       Objective:   Physical Exam  Nursing note and vitals reviewed. Constitutional: She is oriented to person, place, and time. She appears well-developed and well-nourished. No distress.  HENT:  Head: Normocephalic and atraumatic.  Right Ear: External ear normal.  Left Ear: External ear normal.  Nose: Nose normal.  Mouth/Throat: Oropharynx is clear and moist.  Eyes:  Conjunctivae and EOM are normal. Pupils are equal, round, and reactive to light.  Neck: Normal range of motion. Neck supple. No JVD present. No tracheal deviation present. No thyromegaly present.  Cardiovascular: Normal rate, regular rhythm, normal heart sounds and intact distal pulses.   No murmur heard. Pulmonary/Chest: Effort normal and breath sounds normal. She has no wheezes. She exhibits no tenderness.  Abdominal: Soft. Bowel sounds are normal.  Musculoskeletal: Normal range of motion. She exhibits no edema and no tenderness.  Lymphadenopathy:    She has no cervical adenopathy.  Neurological: She is alert and oriented to person, place, and time. She has normal reflexes. No cranial nerve deficit.  Skin: Skin is warm and dry. She is not diaphoretic.  Psychiatric: She has a normal mood and affect. Her behavior is normal.          Assessment & Plan:  Increased anxiety and depression over the loss of her Driver's license. Mild paranoid though No syncopy form prolonged QT HTN stable GERD stable

## 2011-03-24 NOTE — Progress Notes (Signed)
Addended by: Stacie Glaze MD E on: 03/24/2011 01:07 PM   Modules accepted: Orders

## 2011-06-30 ENCOUNTER — Ambulatory Visit (INDEPENDENT_AMBULATORY_CARE_PROVIDER_SITE_OTHER): Payer: Medicare Other | Admitting: Internal Medicine

## 2011-06-30 ENCOUNTER — Encounter: Payer: Self-pay | Admitting: Internal Medicine

## 2011-06-30 VITALS — BP 136/82 | HR 76 | Temp 98.3°F | Resp 16 | Ht 66.0 in | Wt 171.0 lb

## 2011-06-30 DIAGNOSIS — G4701 Insomnia due to medical condition: Secondary | ICD-10-CM

## 2011-06-30 DIAGNOSIS — I4581 Long QT syndrome: Secondary | ICD-10-CM | POA: Diagnosis not present

## 2011-06-30 DIAGNOSIS — F329 Major depressive disorder, single episode, unspecified: Secondary | ICD-10-CM | POA: Diagnosis not present

## 2011-06-30 DIAGNOSIS — F3289 Other specified depressive episodes: Secondary | ICD-10-CM

## 2011-06-30 MED ORDER — QUETIAPINE FUMARATE 25 MG PO TABS: 12.5000 mg | ORAL_TABLET | Freq: Every day | ORAL | Status: DC

## 2011-06-30 MED ORDER — VENLAFAXINE HCL ER 150 MG PO CP24: 150.0000 mg | ORAL_CAPSULE | Freq: Every day | ORAL | Status: DC
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
## 2011-06-30 NOTE — Patient Instructions (Signed)
The patient is instructed to continue all medications as prescribed. Schedule followup with check out clerk upon leaving the clinic  

## 2011-07-03 ENCOUNTER — Telehealth: Payer: Self-pay | Admitting: Internal Medicine

## 2011-07-03 NOTE — Telephone Encounter (Signed)
Pt called and said that she was prescribed QUEtiapine (SEROQUEL) 25 MG tablet, but pt said that she was prescribed this med in 2011, and for some reason, she could not take this med and was taken off of it. Should pt take this med now? Pt wants to know if Xanax be ok?

## 2011-07-03 NOTE — Telephone Encounter (Signed)
Need directions please and will call it in and tell pt.

## 2011-07-03 NOTE — Telephone Encounter (Signed)
Med was sent in at last ov and it is listed in med list

## 2011-07-03 NOTE — Telephone Encounter (Signed)
Per dr Lovell Sheehan- she couldn't take seroquel before because of other medication she was on-- so he would like you to try it now since you are off those meds now

## 2011-07-05 ENCOUNTER — Telehealth: Payer: Self-pay | Admitting: Internal Medicine

## 2011-07-05 NOTE — Telephone Encounter (Signed)
Stop the          seroquel and may have doxepin 25 at bedtime per dr Lovell Sheehan

## 2011-07-05 NOTE — Telephone Encounter (Signed)
Pt said that she could not take Seroquell because it made pt sick. The other med that made pt sick was Nabumentone. Pt can not take either of these meds. Pt has a could day supply of Seroquell, but she is not going to take it. Pt is req call back from nurse asap today.

## 2011-07-05 NOTE — Telephone Encounter (Signed)
Left a message for pt to return call 

## 2011-07-06 ENCOUNTER — Telehealth: Payer: Self-pay | Admitting: *Deleted

## 2011-07-06 MED ORDER — DOXEPIN HCL 25 MG PO CAPS: 25.0000 mg | ORAL_CAPSULE | Freq: Every day | ORAL | Status: DC

## 2011-07-06 NOTE — Telephone Encounter (Signed)
Pt is calling for a change of meds.  Per Dr. Lovell Sheehan, DC Seroquel, and start Doxepin 25 mg. q hs.

## 2011-07-27 DIAGNOSIS — M5137 Other intervertebral disc degeneration, lumbosacral region: Secondary | ICD-10-CM | POA: Diagnosis not present

## 2011-08-01 DIAGNOSIS — H43819 Vitreous degeneration, unspecified eye: Secondary | ICD-10-CM | POA: Diagnosis not present

## 2011-08-01 DIAGNOSIS — H25019 Cortical age-related cataract, unspecified eye: Secondary | ICD-10-CM | POA: Diagnosis not present

## 2011-09-11 ENCOUNTER — Other Ambulatory Visit: Payer: Self-pay | Admitting: Internal Medicine

## 2011-09-15 DIAGNOSIS — H01009 Unspecified blepharitis unspecified eye, unspecified eyelid: Secondary | ICD-10-CM | POA: Diagnosis not present

## 2011-09-15 DIAGNOSIS — H0019 Chalazion unspecified eye, unspecified eyelid: Secondary | ICD-10-CM | POA: Diagnosis not present

## 2011-09-28 ENCOUNTER — Emergency Department (HOSPITAL_COMMUNITY): Payer: Medicare Other

## 2011-09-28 ENCOUNTER — Emergency Department (HOSPITAL_COMMUNITY)
Admission: EM | Admit: 2011-09-28 | Discharge: 2011-09-28 | Disposition: A | Discharge status: Home / Self Care | Payer: Medicare Other | Attending: Emergency Medicine | Admitting: Emergency Medicine

## 2011-09-28 ENCOUNTER — Encounter (HOSPITAL_COMMUNITY): Payer: Self-pay | Admitting: Emergency Medicine

## 2011-09-28 DIAGNOSIS — S5010XA Contusion of unspecified forearm, initial encounter: Secondary | ICD-10-CM | POA: Insufficient documentation

## 2011-09-28 DIAGNOSIS — R404 Transient alteration of awareness: Secondary | ICD-10-CM | POA: Diagnosis not present

## 2011-09-28 DIAGNOSIS — W1809XA Striking against other object with subsequent fall, initial encounter: Secondary | ICD-10-CM | POA: Insufficient documentation

## 2011-09-28 DIAGNOSIS — S52609A Unspecified fracture of lower end of unspecified ulna, initial encounter for closed fracture: Secondary | ICD-10-CM | POA: Insufficient documentation

## 2011-09-28 DIAGNOSIS — R51 Headache: Secondary | ICD-10-CM | POA: Insufficient documentation

## 2011-09-28 DIAGNOSIS — S060X1A Concussion with loss of consciousness of 30 minutes or less, initial encounter: Secondary | ICD-10-CM | POA: Insufficient documentation

## 2011-09-28 DIAGNOSIS — I10 Essential (primary) hypertension: Secondary | ICD-10-CM | POA: Diagnosis not present

## 2011-09-28 DIAGNOSIS — M542 Cervicalgia: Secondary | ICD-10-CM | POA: Diagnosis not present

## 2011-09-28 DIAGNOSIS — S01511A Laceration without foreign body of lip, initial encounter: Secondary | ICD-10-CM

## 2011-09-28 DIAGNOSIS — M129 Arthropathy, unspecified: Secondary | ICD-10-CM | POA: Insufficient documentation

## 2011-09-28 DIAGNOSIS — S0190XA Unspecified open wound of unspecified part of head, initial encounter: Secondary | ICD-10-CM | POA: Diagnosis not present

## 2011-09-28 DIAGNOSIS — Z79899 Other long term (current) drug therapy: Secondary | ICD-10-CM | POA: Diagnosis not present

## 2011-09-28 DIAGNOSIS — IMO0001 Reserved for inherently not codable concepts without codable children: Secondary | ICD-10-CM | POA: Insufficient documentation

## 2011-09-28 DIAGNOSIS — J45909 Unspecified asthma, uncomplicated: Secondary | ICD-10-CM | POA: Insufficient documentation

## 2011-09-28 DIAGNOSIS — S52201K Unspecified fracture of shaft of right ulna, subsequent encounter for closed fracture with nonunion: Secondary | ICD-10-CM

## 2011-09-28 DIAGNOSIS — S01501A Unspecified open wound of lip, initial encounter: Secondary | ICD-10-CM | POA: Insufficient documentation

## 2011-09-28 DIAGNOSIS — K219 Gastro-esophageal reflux disease without esophagitis: Secondary | ICD-10-CM | POA: Insufficient documentation

## 2011-09-28 DIAGNOSIS — M79609 Pain in unspecified limb: Secondary | ICD-10-CM | POA: Insufficient documentation

## 2011-09-28 DIAGNOSIS — Z9181 History of falling: Secondary | ICD-10-CM | POA: Diagnosis not present

## 2011-09-28 DIAGNOSIS — S52209A Unspecified fracture of shaft of unspecified ulna, initial encounter for closed fracture: Secondary | ICD-10-CM | POA: Diagnosis not present

## 2011-09-28 MED ORDER — OXYCODONE HCL 5 MG PO TABS
5.0000 mg | ORAL_TABLET | Freq: Once | ORAL | Status: AC
  Administered 2011-09-28: 5 mg via ORAL
  Filled 2011-09-28: qty 1

## 2011-09-28 MED ORDER — OXYCODONE HCL 5 MG PO TABS: 5.0000 mg | ORAL_TABLET | ORAL | Status: AC | PRN

## 2011-09-28 MED ORDER — TETANUS-DIPHTH-ACELL PERTUSSIS 5-2.5-18.5 LF-MCG/0.5 IM SUSP
0.5000 mL | Freq: Once | INTRAMUSCULAR | Status: AC
  Administered 2011-09-28: 0.5 mL via INTRAMUSCULAR
  Filled 2011-09-28: qty 0.5

## 2011-09-28 NOTE — ED Notes (Signed)
Pt fell on face. 1 inch laceration to rt cheek. + loc. Hx of syncope.

## 2011-09-28 NOTE — ED Provider Notes (Signed)
Medical screening examination/treatment/procedure(s) were performed by non-physician practitioner and as supervising physician I was immediately available for consultation/collaboration.  I discussed the patient at length with PA Marisue Humble, and discussed the radiographic findings with our radiologist.  Gerhard Munch, MD 09/28/11 [id number]

## 2011-09-28 NOTE — ED Notes (Signed)
Pt has facial laceration that went from under the right nare to above the right side of the lip. Small drainage of bright red blood noted.

## 2011-09-28 NOTE — ED Provider Notes (Signed)
History     CSN: [id number]  Arrival date & time 09/28/11  1539   First MD Initiated Contact with Patient 09/28/11 1619      Chief Complaint  Patient presents with  . Facial Laceration    (Consider location/radiation/quality/duration/timing/severity/associated sxs/prior treatment) HPI Comments: Patient here after tripping and catching her right foot on the side of a chair, she states she fell forward onto the the countertop and thinks that she cut her right side of her face on a green bean can on the counter, she reports headache and brief LOC to about 2 minutes.  States laceration is hemostatic, unknown tetanus, pain and bruising noted to right forearm, pain to right great toe - ambulatory without difficulty.  Patient is a 61 y.o. female presenting with fall. The history is provided by the patient. No language interpreter was used.  Fall The accident occurred 1 to 2 hours ago. The fall occurred while walking. She fell from a height of 3 to 5 ft. Impact surface: counter edge. The volume of blood lost was minimal. Point of impact: face, right forearm and right toe. Pain location: face, right forearm and right great toe. The pain is at a severity of 7/10. The pain is moderate. She was ambulatory at the scene. There was no entrapment after the fall. There was no drug use involved in the accident. There was no alcohol use involved in the accident. Associated symptoms include headaches and loss of consciousness. Pertinent negatives include no visual change, no fever, no numbness, no abdominal pain, no bowel incontinence, no nausea, no vomiting, no hematuria, no hearing loss and no tingling. The symptoms are aggravated by activity. She has tried nothing for the symptoms. The treatment provided no relief.    Past Medical History  Diagnosis Date  . GERD (gastroesophageal reflux disease)   . Hypertension   . 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 Syncope   . Asthma   . Low back pain   . Arthritis      Past Surgical History  Procedure Date  . Left breast tumor removed- benign   . Tonsillectomy   . Cervical injections     Family History  Problem Relation Age of Onset  . COPD Mother   . Hypertension Mother   . Heart disease Mother     History  Substance Use Topics  . Smoking status: Never Smoker   . Smokeless tobacco: Not on file  . Alcohol Use: Yes    OB History    Grav Para Term Preterm Abortions TAB SAB Ect Mult Living                  Review of Systems  Constitutional: Negative for fever.  HENT: Positive for neck pain.   Gastrointestinal: Negative for nausea, vomiting, abdominal pain and bowel incontinence.  Genitourinary: Negative for hematuria.  Musculoskeletal: Positive for myalgias, joint swelling and arthralgias. Negative for back pain and gait problem.  Neurological: Positive for loss of consciousness and headaches. Negative for tingling and numbness.  All other systems reviewed and are negative.    Allergies  Acetaminophen; Codeine; Diazepam; Diphenhydramine hcl; and Penicillins  Home Medications   Current Outpatient Rx  Name Route Sig Dispense Refill  . ACEBUTOLOL HCL 400 MG PO CAPS Oral Take 400 mg by mouth daily.    . ATORVASTATIN CALCIUM 20 MG PO TABS Oral Take 20 mg by mouth daily.    Marland Kitchen OLMESARTAN MEDOXOMIL 40 MG PO TABS Oral Take 40  mg by mouth daily.      Marland Kitchen BUTALBITAL-APAP-CAFFEINE 50-500-40 MG PO TABS Oral Take 1 tablet by mouth every 4 (four) hours as needed.      Marland Kitchen CLONAZEPAM 1 MG PO TABS  1/2 two times a day and 1 tab a bedtime 180 tablet 1  . DOXEPIN HCL 25 MG PO CAPS Oral Take 1 capsule (25 mg total) by mouth at bedtime. 30 capsule 1  . ESOMEPRAZOLE MAGNESIUM 40 MG PO CPDR Oral Take 40 mg by mouth daily before breakfast.    . HYDROCODONE-IBUPROFEN 7.5-200 MG PO TABS Oral Take 1 tablet by mouth every 8 (eight) hours as needed.      Marland Kitchen MELATONIN-PYRIDOXINE 3-1 MG PO TABS Oral Take 1 capsule by mouth at bedtime as needed.  0  .  MOMETASONE FUROATE 50 MCG/ACT NA SUSP Nasal 2 sprays by Nasal route daily as needed.      . MULTIVITAMINS PO TABS Oral Take 1 tablet by mouth daily.      . TRIAMTERENE-HCTZ 37.5-25 MG PO TABS Oral Take 1 tablet by mouth 2 (two) times daily.      . VENLAFAXINE HCL ER 150 MG PO CP24 Oral Take 1 capsule (150 mg total) by mouth daily. 30 capsule 11  . VENLAFAXINE HCL 75 MG PO TABS Oral Take 1 tablet (75 mg total) by mouth 2 (two) times daily. 180 tablet 1    BP 137/80  Pulse 78  Temp(Src) 97.7 F (36.5 C) (Oral)  SpO2 91%  Physical Exam  Nursing note and vitals reviewed. Constitutional: She is oriented to person, place, and time. She appears well-developed and well-nourished. No distress.  HENT:  Head: Normocephalic. Head is with laceration.    Right Ear: External ear normal.  Left Ear: External ear normal.  Nose: Nose normal.  Mouth/Throat: Oropharynx is clear and moist. No oropharyngeal exudate.  Eyes: Conjunctivae are normal. Pupils are equal, round, and reactive to light. No scleral icterus.  Neck: Normal range of motion. Neck supple. Spinous process tenderness and muscular tenderness present. Normal range of motion present.  Cardiovascular: Normal rate, regular rhythm and normal heart sounds.  Exam reveals no gallop and no friction rub.   No murmur heard. Pulmonary/Chest: Effort normal and breath sounds normal. No respiratory distress. She has no wheezes. She has no rales. She exhibits no tenderness.  Abdominal: Soft. Bowel sounds are normal. She exhibits no distension. There is no tenderness.  Musculoskeletal: Normal range of motion. She exhibits tenderness. She exhibits no edema.       Hematoma and ttp to right forearm ttp to right MTP joint - no deformity  Lymphadenopathy:    She has no cervical adenopathy.  Neurological: She is alert and oriented to person, place, and time. No cranial nerve deficit. She exhibits normal muscle tone. Coordination normal.  Skin: Skin is warm and  dry. No rash noted. No erythema. No pallor.  Psychiatric: She has a normal mood and affect. Her behavior is normal. Judgment and thought content normal.    ED Course  Procedures (including critical care time)  Labs Reviewed - No data to display No results found.  Results for orders placed in visit on 12/21/10  BASIC METABOLIC PANEL      Component Value Range   Sodium 139  135 - 145 (mEq/L)   Potassium 4.6  3.5 - 5.1 (mEq/L)   Chloride 103  96 - 112 (mEq/L)   CO2 28  19 - 32 (mEq/L)   Glucose, Bld 85  70 - 99 (mg/dL)   BUN 38 (*) 6 - 23 (mg/dL)   Creatinine, Ser 1.4 (*) 0.4 - 1.2 (mg/dL)   Calcium 9.6  8.4 - 40.9 (mg/dL)   GFR 81.19 (*) >14.78 (mL/min)  TSH      Component Value Range   TSH 1.57  0.35 - 5.50 (uIU/mL)  LDL CHOLESTEROL, DIRECT      Component Value Range   Direct LDL 121.4    LIPID PANEL      Component Value Range   Cholesterol 206 (*) 0 - 200 (mg/dL)   Triglycerides 295.6 (*) 0.0 - 149.0 (mg/dL)   HDL 21.30  >86.57 (mg/dL)   VLDL 84.6  0.0 - 96.2 (mg/dL)   Total CHOL/HDL Ratio 3    MAGNESIUM      Component Value Range   Magnesium 2.1  1.5 - 2.5 (mg/dL)   Dg Forearm Right  9/52/8413  *RADIOLOGY REPORT*  Clinical Data: History of fall complaining of forearm pain.  RIGHT FOREARM - 2 VIEW  Comparison: No priors.  Findings: Two views of the right forearm demonstrates any fracture of the distal third of the ulnar diaphysis approximately 2 mm of medial displacement of the distal fracture fragment and no significant angulation.  Notably, the fracture margins appear unusual and are somewhat sclerotic.  There is minimal comminution (a tiny bony fragment is noted laterally).  The radius is intact.  IMPRESSION: 1.  Unusual appearing fracture of the distal third of the ulnar diaphysis, as above.  This does not have typical imaging features of an acute fracture, which could either suggest a sequela of more remote injury (several weeks old), or could indicate a pathologic  fracture through a preexisting lesion.  Clinical correlation for signs and symptoms of malignancy may be warranted.  These results were called by telephone on 09/28/2010  at  05:40 p.m. to Cherrie Distance, PA, who verbally acknowledged these results.  Original Report Authenticated By: Florencia Reasons, M.D.   Ct Head Wo Contrast  09/28/2011  *RADIOLOGY REPORT*  Clinical Data:  History of fall with facial laceration.  CT HEAD WITHOUT CONTRAST CT CERVICAL SPINE WITHOUT CONTRAST  Technique:  Multidetector CT imaging of the head and cervical spine was performed following the standard protocol without intravenous contrast.  Multiplanar CT image reconstructions of the cervical spine were also generated.  Comparison:  MRI of the cervical spine 05/26/2005.  CT HEAD  Findings: No acute displaced skull fractures are identified.  No acute intracranial abnormalities.  Specifically, no evidence of acute post-traumatic hemorrhage, no overt signs to suggest acute/subacute cerebral ischemia, and no focal mass, mass effect, hydrocephalus or abnormal intra or extra-axial fluid collections. Visualized paranasal sinuses and mastoids are well pneumatized.  IMPRESSION:  1.  No acute displaced skull fractures or evidence of significant acute traumatic injury to the brain. 2.  The appearance of the brain is normal.  CT CERVICAL SPINE  Findings: No acute displaced cervical spine fractures are identified.  There is reversal of the normal cervical lordosis centered at C4-C5, favored to be positional.  Prevertebral soft tissues are normal.  There is multilevel degenerative disc disease, most severe at C5-C6 and C6-C7.  There is also multilevel facet arthropathy.  Visualized portions of the lung apices demonstrates some mild bilateral pleuroparenchymal thickening, which likely reflects scarring.  IMPRESSION: 1.  No evidence of significant acute traumatic injury to the cervical spine. 2.  Multilevel degenerative disc disease and cervical  spondylosis, as above.  Original Report Authenticated By:  Florencia Reasons, M.D.   Ct Cervical Spine Wo Contrast  09/28/2011  *RADIOLOGY REPORT*  Clinical Data:  History of fall with facial laceration.  CT HEAD WITHOUT CONTRAST CT CERVICAL SPINE WITHOUT CONTRAST  Technique:  Multidetector CT imaging of the head and cervical spine was performed following the standard protocol without intravenous contrast.  Multiplanar CT image reconstructions of the cervical spine were also generated.  Comparison:  MRI of the cervical spine 05/26/2005.  CT HEAD  Findings: No acute displaced skull fractures are identified.  No acute intracranial abnormalities.  Specifically, no evidence of acute post-traumatic hemorrhage, no overt signs to suggest acute/subacute cerebral ischemia, and no focal mass, mass effect, hydrocephalus or abnormal intra or extra-axial fluid collections. Visualized paranasal sinuses and mastoids are well pneumatized.  IMPRESSION:  1.  No acute displaced skull fractures or evidence of significant acute traumatic injury to the brain. 2.  The appearance of the brain is normal.  CT CERVICAL SPINE  Findings: No acute displaced cervical spine fractures are identified.  There is reversal of the normal cervical lordosis centered at C4-C5, favored to be positional.  Prevertebral soft tissues are normal.  There is multilevel degenerative disc disease, most severe at C5-C6 and C6-C7.  There is also multilevel facet arthropathy.  Visualized portions of the lung apices demonstrates some mild bilateral pleuroparenchymal thickening, which likely reflects scarring.  IMPRESSION: 1.  No evidence of significant acute traumatic injury to the cervical spine. 2.  Multilevel degenerative disc disease and cervical spondylosis, as above.  Original Report Authenticated By: Florencia Reasons, M.D.   Dg Foot Complete Right  09/28/2011  *RADIOLOGY REPORT*  Clinical Data: History of fall complaining of right foot pain medial to the  first toe.  RIGHT FOOT COMPLETE - 3+ VIEW  Comparison: No priors.  Findings: Three views the right foot demonstrates a bunion deformity of the first MTP joint.  No acute displaced fracture, subluxation or dislocation is otherwise noted.  IMPRESSION: 1.  No acute radiographic abnormality of the right foot. 2.  Mild bunion deformity.  Original Report Authenticated By: Florencia Reasons, M.D.   LACERATION REPAIR Performed by: Patrecia Pour Authorized by: Patrecia Pour Consent: Verbal consent obtained. Risks and benefits: risks, benefits and alternatives were discussed Consent given by: patient Patient identity confirmed: provided demographic data Prepped and Draped in normal sterile fashion Wound explored  Laceration Location: right upper lip  Laceration Length: 2cm  No Foreign Bodies seen or palpated  Anesthesia: local infiltration  Local anesthetic: lidocaine 2% without epinephrine  Anesthetic total: 4 ml  Irrigation method: syringe Amount of cleaning: standard  Skin closure: 6.0 nylon  Number of sutures: 5  Technique: simple interrupted  Patient tolerance: Patient tolerated the procedure well with no immediate complications.  Lip laceration Concussion Contusion of right forearm.    MDM  Upon further discussion with the patient, she has a known right distal ulna fracture with non-healing and is being followed about this with Dr. Amanda Pea with hand surgery.  Concussion with LOC - lip laceration sutured.        Izola Price [place], [place] 09/28/11 1908

## 2011-09-28 NOTE — Discharge Instructions (Signed)
Concussion and Brain Injury A blow or jolt to the head can disrupt the normal function of the brain. This type of brain injury is often called a "concussion" or a "closed head injury." Concussions are usually not life-threatening. Even so, the effects of a concussion can be serious.  CAUSES  A concussion is caused by a blunt blow to the head. The blow might be direct or indirect as described below.  Direct blow (running into another player during a soccer game, being hit in a fight, or hitting your head on a hard surface).   Indirect blow (when your head moves rapidly and violently back and forth like in a car crash).  SYMPTOMS  The brain is very complex. Every head injury is different. Some symptoms may appear right away. Other symptoms may not show up for days or weeks after the concussion. The signs of concussion can be hard to notice. Early on, problems may be missed by patients, family members, and caregivers. You may look fine even though you are acting or feeling differently.  These symptoms are usually temporary, but may last for days, weeks, or even longer. Symptoms include:  Mild headaches that will not go away.   Having more trouble than usual with:   Remembering things.   Paying attention or concentrating.   Organizing daily tasks.   Making decisions and solving problems.   Slowness in thinking, acting, speaking, or reading.   Getting lost or easily confused.   Feeling tired all the time or lacking energy (fatigue).   Feeling drowsy.   Sleep disturbances.   Sleeping more than usual.   Sleeping less than usual.   Trouble falling asleep.   Trouble sleeping (insomnia).   Loss of balance or feeling lightheaded or dizzy.   Nausea or vomiting.   Numbness or tingling.   Increased sensitivity to:   Sounds.   Lights.   Distractions.  Other symptoms might include:  Vision problems or eyes that tire easily.   Diminished sense of taste or smell.   Ringing  in the ears.   Mood changes such as feeling sad, anxious, or listless.   Becoming easily irritated or angry for little or no reason.   Lack of motivation.  DIAGNOSIS  Your caregiver can usually diagnose a concussion or mild brain injury based on your description of your injury and your symptoms.  Your evaluation might include:  A brain scan to look for signs of injury to the brain. Even if the test shows no injury, you may still have a concussion.   Blood tests to be sure other problems are not present.  TREATMENT   People with a concussion need to be examined and evaluated. Most people with concussions are treated in an emergency department, urgent care, or clinic. Some people must stay in the hospital overnight for further treatment.   Your caregiver will send you home with important instructions to follow. Be sure to carefully follow them.   Tell your caregiver if you are already taking any medicines (prescription, over-the-counter, or natural remedies), or if you are drinking alcohol or taking illegal drugs. Also, talk with your caregiver if you are taking blood thinners (anticoagulants) or aspirin. These drugs may increase your chances of complications. All of this is important information that may affect treatment.   Only take over-the-counter or prescription medicines for pain, discomfort, or fever as directed by your caregiver.  PROGNOSIS  How fast people recover from brain injury varies from person to person.   Although most people have a good recovery, how quickly they improve depends on many factors. These factors include how severe their concussion was, what part of the brain was injured, their age, and how healthy they were before the concussion.  Because all head injuries are different, so is recovery. Most people with mild injuries recover fully. Recovery can take time. In general, recovery is slower in older persons. Also, persons who have had a concussion in the past or have  other medical problems may find that it takes longer to recover from their current injury. Anxiety and depression may also make it harder to adjust to the symptoms of brain injury. HOME CARE INSTRUCTIONS  Return to your normal activities slowly, not all at once. You must give your body and brain enough time for recovery.  Get plenty of sleep at night, and rest during the day. Rest helps the brain to heal.   Avoid staying up late at night.   Keep the same bedtime hours on weekends and weekdays.   Take daytime naps or rest breaks when you feel tired.   Limit activities that require a lot of thought or concentration (brain or cognitive rest). This includes:   Homework or job-related work.   Watching TV.   Computer work.   Avoid activities that could lead to a second brain injury, such as contact or recreational sports, until your caregiver says it is okay. Even after your brain injury has healed, you should protect yourself from having another concussion.   Ask your caregiver when you can return to your normal activities such as driving, bicycling, or operating heavy equipment. Your ability to react may be slower after a brain injury.   Talk with your caregiver about when you can return to work or school.   Inform your teachers, school nurse, school counselor, coach, Product/process development scientist, or work Freight forwarder about your injury, symptoms, and restrictions. They should be instructed to report:   Increased problems with attention or concentration.   Increased problems remembering or learning new information.   Increased time needed to complete tasks or assignments.   Increased irritability or decreased ability to cope with stress.   Increased symptoms.   Take only those medicines that your caregiver has approved.   Do not drink alcohol until your caregiver says you are well enough to do so. Alcohol and certain other drugs may slow your recovery and can put you at risk of further injury.    If it is harder than usual to remember things, write them down.   If you are easily distracted, try to do one thing at a time. For example, do not try to watch TV while fixing dinner.   Talk with family members or close friends when making important decisions.   Keep all follow-up appointments. Repeated evaluation of your symptoms is recommended for your recovery.  PREVENTION  Protect your head from future injury. It is very important to avoid another head or brain injury before you have recovered. In rare cases, another injury has lead to permanent brain damage, brain swelling, or death. Avoid injuries by using:  Seatbelts when riding in a car.   Alcohol only in moderation.   A helmet when biking, skiing, skateboarding, skating, or doing similar activities.   Safety measures in your home.   Remove clutter and tripping hazards from floors and stairways.   Use grab bars in bathrooms and handrails by stairs.   Place non-slip mats on floors and in bathtubs.  Improve lighting in dim areas.  SEEK MEDICAL CARE IF:  A head injury can cause lingering symptoms. You should seek medical care if you have any of the following symptoms for more than 3 weeks after your injury or are planning to return to sports:  Chronic headaches.   Dizziness or balance problems.   Nausea.   Vision problems.   Increased sensitivity to noise or light.   Depression or mood swings.   Anxiety or irritability.   Memory problems.   Difficulty concentrating or paying attention.   Sleep problems.   Feeling tired all the time.  SEEK IMMEDIATE MEDICAL CARE IF:  You have had a blow or jolt to the head and you (or your family or friends) notice:  Severe or worsening headaches.   Weakness (even if only in one hand or one leg or one part of the face), numbness, or decreased coordination.   Repeated vomiting.   Increased sleepiness or passing out.   One black center of the eye (pupil) is larger  than the other.   Convulsions (seizures).   Slurred speech.   Increasing confusion, restlessness, agitation, or irritability.   Lack of ability to recognize people or places.   Neck pain.   Difficulty being awakened.   Unusual behavior changes.   Loss of consciousness.  Older adults with a brain injury may have a higher risk of serious complications such as a blood clot on the brain. Headaches that get worse or an increase in confusion are signs of this complication. If these signs occur, see a caregiver right away. MAKE SURE YOU:   Understand these instructions.   Will watch your condition.   Will get help right away if you are not doing well or get worse.  FOR MORE INFORMATION  Several groups help people with brain injury and their families. They provide information and put people in touch with local resources. These include support groups, rehabilitation services, and a variety of health care professionals. Among these groups, the Brain Injury Association (BIA, www.biausa.org) has a Secretary/administrator that gathers scientific and educational information and works on a national level to help people with brain injury.  Document Released: 07/15/2003 Document Revised: 04/13/2011 Document Reviewed: 12/11/2007 [facility] Patient Information 2012 [place], [place].Facial Laceration A facial laceration is a cut on the face. Lacerations usually heal quickly, but they need special care to reduce scarring. It will take 1 to 2 years for the scar to lose its redness and to heal completely. TREATMENT  Some facial lacerations may not require closure. Some lacerations may not be able to be closed due to an increased risk of infection. It is important to see your caregiver as soon as possible after an injury to minimize the risk of infection and to maximize the opportunity for successful closure. If closure is appropriate, pain medicines may be given, if needed. The wound will be cleaned to help prevent  infection. Your caregiver will use stitches (sutures), staples, wound glue (adhesive), or skin adhesive strips to repair the laceration. These tools bring the skin edges together to allow for faster healing and a better cosmetic outcome. However, all wounds will heal with a scar.  Once the wound has healed, scarring can be minimized by covering the wound with sunscreen during the day for 1 full year. Use a sunscreen with an SPF of at least 30. Sunscreen helps to reduce the pigment that will form in the scar. When applying sunscreen to a completely healed wound, massage the  scar for a few minutes to help reduce the appearance of the scar. Use circular motions with your fingertips, on and around the scar. Do not massage a healing wound. HOME CARE INSTRUCTIONS For sutures:  Keep the wound clean and dry.   If you were given a bandage (dressing), you should change it at least once a day. Also change the dressing if it becomes wet or dirty, or as directed by your caregiver.   Wash the wound with soap and water 2 times a day. Rinse the wound off with water to remove all soap. Pat the wound dry with a clean towel.   After cleaning, apply a thin layer of the antibiotic ointment recommended by your caregiver. This will help prevent infection and keep the dressing from sticking.   You may shower as usual after the first 24 hours. Do not soak the wound in water until the sutures are removed.   Only take over-the-counter or prescription medicines for pain, discomfort, or fever as directed by your caregiver.   Get your sutures removed as directed by your caregiver. With facial lacerations, sutures should usually be taken out after 4 to 5 days to avoid stitch marks.   Wait a few days after your sutures are removed before applying makeup.  For skin adhesive strips:  Keep the wound clean and dry.   Do not get the skin adhesive strips wet. You may bathe carefully, using caution to keep the wound dry.   If  the wound gets wet, pat it dry with a clean towel.   Skin adhesive strips will fall off on their own. You may trim the strips as the wound heals. Do not remove skin adhesive strips that are still stuck to the wound. They will fall off in time.  For wound adhesive:  You may briefly wet your wound in the shower or bath. Do not soak or scrub the wound. Do not swim. Avoid periods of heavy perspiration until the skin adhesive has fallen off on its own. After showering or bathing, gently pat the wound dry with a clean towel.   Do not apply liquid medicine, cream medicine, ointment medicine, or makeup to your wound while the skin adhesive is in place. This may loosen the film before your wound is healed.   If a dressing is placed over the wound, be careful not to apply tape directly over the skin adhesive. This may cause the adhesive to be pulled off before the wound is healed.   Avoid prolonged exposure to sunlight or tanning lamps while the skin adhesive is in place. Exposure to ultraviolet light in the first year will darken the scar.   The skin adhesive will usually remain in place for 5 to 10 days, then naturally fall off the skin. Do not pick at the adhesive film.  You may need a tetanus shot if:  You cannot remember when you had your last tetanus shot.   You have never had a tetanus shot.  If you get a tetanus shot, your arm may swell, get red, and feel warm to the touch. This is common and not a problem. If you need a tetanus shot and you choose not to have one, there is a rare chance of getting tetanus. Sickness from tetanus can be serious. SEEK IMMEDIATE MEDICAL CARE IF:  You develop redness, pain, or swelling around the wound.   There is yellowish-white fluid (pus) coming from the wound.   You develop chills or a fever.  MAKE SURE YOU:  Understand these instructions.   Will watch your condition.   Will get help right away if you are not doing well or get worse.  Document  Released: 06/01/2004 Document Revised: 04/13/2011 Document Reviewed: 10/17/2010 [facility] Patient Information 2012 [place], [place].

## 2011-10-06 ENCOUNTER — Ambulatory Visit: Payer: Medicare Other | Admitting: Internal Medicine

## 2011-10-06 ENCOUNTER — Ambulatory Visit (INDEPENDENT_AMBULATORY_CARE_PROVIDER_SITE_OTHER): Payer: Medicare Other | Admitting: Internal Medicine

## 2011-10-06 ENCOUNTER — Encounter: Payer: Self-pay | Admitting: Internal Medicine

## 2011-10-06 VITALS — BP 110/70 | HR 72 | Temp 98.2°F | Resp 16 | Ht 66.0 in | Wt 176.0 lb

## 2011-10-06 DIAGNOSIS — T148XXA Other injury of unspecified body region, initial encounter: Secondary | ICD-10-CM

## 2011-10-06 DIAGNOSIS — I4581 Long QT syndrome: Secondary | ICD-10-CM

## 2011-10-06 DIAGNOSIS — L905 Scar conditions and fibrosis of skin: Secondary | ICD-10-CM

## 2011-10-06 DIAGNOSIS — IMO0002 Reserved for concepts with insufficient information to code with codable children: Secondary | ICD-10-CM

## 2011-10-06 MED ORDER — CLONAZEPAM 1 MG PO TABS: ORAL_TABLET | ORAL | Status: DC

## 2011-10-06 NOTE — Progress Notes (Signed)
Subjective:    Patient ID: Dawn Howard, female    DOB: 1950-08-18, 61 y.o.   MRN: [id number]  HPI  The pt has a court case pending for DUI and has extreme anxiety over the court. Fall with sutures required. Cannot recall the etiology of the fall. Denies ETOH. Very pressured speech. Depressed. HTN stable No apparent seizure activity. Sutures from fall to be removed from now.   Review of Systems  Constitutional: Negative for activity change, appetite change and fatigue.  HENT: Negative for ear pain, congestion, neck pain, postnasal drip and sinus pressure.   Eyes: Negative for redness and visual disturbance.  Respiratory: Negative for cough, shortness of breath and wheezing.   Gastrointestinal: Negative for abdominal pain and abdominal distention.  Genitourinary: Negative for dysuria, frequency and menstrual problem.  Musculoskeletal: Negative for myalgias, joint swelling and arthralgias.  Skin: Negative for rash and wound.  Neurological: Negative for dizziness, weakness and headaches.  Hematological: Negative for adenopathy. Does not bruise/bleed easily.  Psychiatric/Behavioral: Negative for sleep disturbance and decreased concentration.   Past Medical History  Diagnosis Date  . GERD (gastroesophageal reflux disease)   . Hypertension   . 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 Syncope   . Asthma   . Low back pain   . Arthritis     History   Social History  . Marital Status: Married    Spouse Name: N/A    Number of Children: N/A  . Years of Education: N/A   Occupational History  . retired    Social History Main Topics  . Smoking status: Never Smoker   . Smokeless tobacco: Not on file  . Alcohol Use: Yes  . Drug Use: No  . Sexually Active: Not on file   Other Topics Concern  . Not on file   Social History Narrative  . No narrative on file    Past Surgical History  Procedure Date  . Left breast tumor removed- benign   . Tonsillectomy   . Cervical  injections     Family History  Problem Relation Age of Onset  . COPD Mother   . Hypertension Mother   . Heart disease Mother     Allergies  Allergen Reactions  . Acetaminophen Hives    Break out  . Codeine   . Diazepam   . Diphenhydramine Hcl   . Penicillins     Current Outpatient Prescriptions on File Prior to Visit  Medication Sig Dispense Refill  . acebutolol (SECTRAL) 400 MG capsule Take 400 mg by mouth daily.      Marland Kitchen atorvastatin (LIPITOR) 20 MG tablet Take 20 mg by mouth daily.      . butalbital-acetaminophen-caffeine (ESGIC PLUS) 50-500-40 MG per tablet Take 1 tablet by mouth every 4 (four) hours as needed.        . doxepin (SINEQUAN) 25 MG capsule Take 1 capsule (25 mg total) by mouth at bedtime.  30 capsule  1  . esomeprazole (NEXIUM) 40 MG capsule Take 40 mg by mouth daily before breakfast.      . HYDROcodone-ibuprofen (VICOPROFEN) 7.5-200 MG per tablet Take 1 tablet by mouth every 8 (eight) hours as needed.        . mometasone (NASONEX) 50 MCG/ACT nasal spray 2 sprays by Nasal route daily as needed.        . Multiple Vitamin (MULITIVITAMIN WITH MINERALS) TABS Take 1 tablet by mouth daily.      Marland Kitchen olmesartan (BENICAR) 40 MG tablet Take  40 mg by mouth daily.        Marland Kitchen oxyCODONE (OXY IR/ROXICODONE) 5 MG immediate release tablet Take 1 tablet (5 mg total) by mouth every 4 (four) hours as needed for pain.  30 tablet  0  . venlafaxine (EFFEXOR XR) 150 MG 24 hr capsule Take 1 capsule (150 mg total) by mouth daily.  30 capsule  11  . DISCONTD: clonazePAM (KLONOPIN) 1 MG tablet 1/2 two times a day and 1 tab a bedtime  180 tablet  1  . DISCONTD: venlafaxine (EFFEXOR) 75 MG tablet Take 1 tablet (75 mg total) by mouth 2 (two) times daily.  180 tablet  1    BP 110/70  Pulse 72  Temp 98.2 F (36.8 C)  Resp 16  Ht 5\' 6"  (1.676 m)  Wt 176 lb (79.833 kg)  BMI 28.41 kg/m2       Objective:   Physical Exam  Nursing note and vitals reviewed. Constitutional: She is oriented to  person, place, and time. She appears well-developed and well-nourished. No distress.  HENT:  Head: Normocephalic and atraumatic.  Right Ear: External ear normal.  Left Ear: External ear normal.  Nose: Nose normal.  Mouth/Throat: Oropharynx is clear and moist.  Eyes: Conjunctivae and EOM are normal. Pupils are equal, round, and reactive to light.  Neck: Normal range of motion. Neck supple. No JVD present. No tracheal deviation present. No thyromegaly present.  Cardiovascular: Normal rate, regular rhythm, normal heart sounds and intact distal pulses.   No murmur heard. Pulmonary/Chest: Effort normal and breath sounds normal. She has no wheezes. She exhibits no tenderness.  Abdominal: Soft. Bowel sounds are normal.  Musculoskeletal: Normal range of motion. She exhibits no edema and no tenderness.  Lymphadenopathy:    She has no cervical adenopathy.  Neurological: She is alert and oriented to person, place, and time. She has normal reflexes. No cranial nerve deficit.  Skin: Skin is warm and dry. She is not diaphoretic.  Psychiatric: She has a normal mood and affect. Her behavior is normal.          Assessment & Plan:  Depressed. On effexor but increased stress and decreased mood Prolonged QT? Could she have had a syncopal episode. It is appropriate that she does not have a drivers license and I am not certain she should have one unless a neurologist agrees. Anxiety increased .removed sutures done at the ER.

## 2011-10-06 NOTE — Patient Instructions (Addendum)
The patient is instructed to continue all medications as prescribed. Schedule followup with check out clerk upon leaving the clinic Mederma cream to scar twice a ay

## 2011-10-09 ENCOUNTER — Other Ambulatory Visit: Payer: Self-pay | Admitting: *Deleted

## 2011-10-09 MED ORDER — MEDERMA EX GEL: 1.0000 "application " | Freq: Once | CUTANEOUS | Status: DC

## 2011-10-23 DIAGNOSIS — M5137 Other intervertebral disc degeneration, lumbosacral region: Secondary | ICD-10-CM | POA: Diagnosis not present

## 2011-11-03 ENCOUNTER — Other Ambulatory Visit: Payer: Self-pay | Admitting: Internal Medicine

## 2012-01-05 ENCOUNTER — Ambulatory Visit: Payer: Medicare Other | Admitting: Internal Medicine

## 2012-01-16 ENCOUNTER — Ambulatory Visit (INDEPENDENT_AMBULATORY_CARE_PROVIDER_SITE_OTHER): Payer: Medicare Other | Admitting: Internal Medicine

## 2012-01-16 ENCOUNTER — Encounter: Payer: Self-pay | Admitting: Internal Medicine

## 2012-01-16 VITALS — BP 124/80 | HR 72 | Temp 98.0°F | Resp 16 | Ht 63.0 in | Wt 183.0 lb

## 2012-01-16 DIAGNOSIS — R51 Headache: Secondary | ICD-10-CM

## 2012-01-16 DIAGNOSIS — F411 Generalized anxiety disorder: Secondary | ICD-10-CM

## 2012-01-16 DIAGNOSIS — I10 Essential (primary) hypertension: Secondary | ICD-10-CM | POA: Diagnosis not present

## 2012-01-16 DIAGNOSIS — I4581 Long QT syndrome: Secondary | ICD-10-CM | POA: Diagnosis not present

## 2012-01-16 DIAGNOSIS — T887XXA Unspecified adverse effect of drug or medicament, initial encounter: Secondary | ICD-10-CM | POA: Diagnosis not present

## 2012-01-16 DIAGNOSIS — Z23 Encounter for immunization: Secondary | ICD-10-CM | POA: Diagnosis not present

## 2012-01-16 DIAGNOSIS — E785 Hyperlipidemia, unspecified: Secondary | ICD-10-CM | POA: Diagnosis not present

## 2012-01-16 DIAGNOSIS — K219 Gastro-esophageal reflux disease without esophagitis: Secondary | ICD-10-CM | POA: Diagnosis not present

## 2012-01-16 LAB — CBC WITH DIFFERENTIAL/PLATELET
Basophils Relative: 0.3 % (ref 0.0–3.0)
Eosinophils Absolute: 0.2 10*3/uL (ref 0.0–0.7)
HCT: 36.9 % (ref 36.0–46.0)
Lymphs Abs: 1.2 10*3/uL (ref 0.7–4.0)
MCHC: 33.7 g/dL (ref 30.0–36.0)
MCV: 92.5 fl (ref 78.0–100.0)
Monocytes Absolute: 0.4 10*3/uL (ref 0.1–1.0)
Neutro Abs: 3.3 10*3/uL (ref 1.4–7.7)
Neutrophils Relative %: 64.6 % (ref 43.0–77.0)
RBC: 3.98 Mil/uL (ref 3.87–5.11)

## 2012-01-16 LAB — HEPATIC FUNCTION PANEL
AST: 29 U/L (ref 0–37)
Albumin: 3.7 g/dL (ref 3.5–5.2)
Total Protein: 6.9 g/dL (ref 6.0–8.3)

## 2012-01-16 LAB — BASIC METABOLIC PANEL
BUN: 25 mg/dL — ABNORMAL HIGH (ref 6–23)
Calcium: 9.6 mg/dL (ref 8.4–10.5)
GFR: 49.1 mL/min — ABNORMAL LOW (ref >60.00)
Glucose, Bld: 119 mg/dL — ABNORMAL HIGH (ref 70–99)

## 2012-01-16 LAB — LIPID PANEL
Cholesterol: 194 mg/dL (ref 0–200)
HDL: 49.5 mg/dL (ref >39.00)
VLDL: 56.6 mg/dL — ABNORMAL HIGH (ref 0.0–40.0)

## 2012-01-16 NOTE — Progress Notes (Signed)
Subjective:    Patient ID: Dawn Howard, female    DOB: 08-22-1950, 61 y.o.   MRN: [id number]  HPI  Pressured speech Still not driving Has gained weight and noted possitional edema that resolved after laying flat Has noted decreased indurance  Review of Systems  Constitutional: Negative for activity change, appetite change and fatigue.  HENT: Negative for ear pain, congestion, neck pain, postnasal drip and sinus pressure.   Eyes: Negative for redness and visual disturbance.  Respiratory: Negative for cough, shortness of breath and wheezing.   Gastrointestinal: Negative for abdominal pain and abdominal distention.  Genitourinary: Negative for dysuria, frequency and menstrual problem.  Musculoskeletal: Negative for myalgias, joint swelling and arthralgias.  Skin: Negative for rash and wound.  Neurological: Negative for dizziness, weakness and headaches.  Hematological: Negative for adenopathy. Does not bruise/bleed easily.  Psychiatric/Behavioral: Negative for disturbed wake/sleep cycle and decreased concentration.    The patient is instructed to continue all medications as prescribed. Schedule followup with check out clerk upon leaving the clinic     Past Medical History  Diagnosis Date  . GERD (gastroesophageal reflux disease)   . Hypertension   . 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 Syncope   . Asthma   . Low back pain   . Arthritis     History   Social History  . Marital Status: Married    Spouse Name: N/A    Number of Children: N/A  . Years of Education: N/A   Occupational History  . retired    Social History Main Topics  . Smoking status: Never Smoker   . Smokeless tobacco: Not on file  . Alcohol Use: Yes  . Drug Use: No  . Sexually Active: Not on file   Other Topics Concern  . Not on file   Social History Narrative  . No narrative on file    Past Surgical History  Procedure Date  . Left breast tumor removed- benign   . Tonsillectomy   . Cervical  injections     Family History  Problem Relation Age of Onset  . COPD Mother   . Hypertension Mother   . Heart disease Mother     Allergies  Allergen Reactions  . Acetaminophen Hives    Break out  . Codeine   . Diazepam   . Diphenhydramine Hcl   . Penicillins     Current Outpatient Prescriptions on File Prior to Visit  Medication Sig Dispense Refill  . acebutolol (SECTRAL) 400 MG capsule Take 400 mg by mouth daily.      Marland Kitchen atorvastatin (LIPITOR) 20 MG tablet Take 20 mg by mouth daily.      . butalbital-acetaminophen-caffeine (ESGIC PLUS) 50-500-40 MG per tablet Take 1 tablet by mouth every 4 (four) hours as needed.        . clonazePAM (KLONOPIN) 1 MG tablet 1/2 two times a day and 1 tab a bedtime  180 tablet  1  . doxepin (SINEQUAN) 25 MG capsule TAKE 1 CAPSULE (25 MG TOTAL) BY MOUTH AT BEDTIME.  30 capsule  1  . esomeprazole (NEXIUM) 40 MG capsule Take 40 mg by mouth daily before breakfast.      . HYDROcodone-ibuprofen (VICOPROFEN) 7.5-200 MG per tablet Take 1 tablet by mouth every 8 (eight) hours as needed.        . mometasone (NASONEX) 50 MCG/ACT nasal spray 2 sprays by Nasal route daily as needed.        . Multiple Vitamin (MULITIVITAMIN  WITH MINERALS) TABS Take 1 tablet by mouth daily.      Marland Kitchen olmesartan (BENICAR) 40 MG tablet Take 40 mg by mouth daily.        . Scar Treatment Products (MEDERMA) GEL Apply 1 application topically once.  20 g  0  . triamterene-hydrochlorothiazide (DYAZIDE) 37.5-25 MG per capsule TAKE ONE CAPSULE BY MOUTH TWICE A DAY  180 capsule  2  . venlafaxine (EFFEXOR XR) 150 MG 24 hr capsule Take 1 capsule (150 mg total) by mouth daily.  30 capsule  11  . DISCONTD: venlafaxine (EFFEXOR) 75 MG tablet Take 75 mg by mouth 2 (two) times daily.        BP 124/80  Pulse 72  Temp 98 F (36.7 C)  Resp 16  Ht 5\' 3"  (1.6 m)  Wt 183 lb (83.008 kg)  BMI 32.42 kg/m2    Objective:   Physical Exam  Nursing note and vitals reviewed. Constitutional: She is  oriented to person, place, and time. She appears well-developed and well-nourished. No distress.  HENT:  Head: Normocephalic and atraumatic.  Right Ear: External ear normal.  Left Ear: External ear normal.  Nose: Nose normal.  Mouth/Throat: Oropharynx is clear and moist.  Eyes: Conjunctivae and EOM are normal. Pupils are equal, round, and reactive to light.  Neck: Normal range of motion. Neck supple. No JVD present. No tracheal deviation present. No thyromegaly present.  Cardiovascular: Normal rate, regular rhythm, normal heart sounds and intact distal pulses.   No murmur heard. Pulmonary/Chest: Effort normal and breath sounds normal. She has no wheezes. She exhibits no tenderness.  Abdominal: Soft. Bowel sounds are normal.  Musculoskeletal: Normal range of motion. She exhibits no edema and no tenderness.  Lymphadenopathy:    She has no cervical adenopathy.  Neurological: She is alert and oriented to person, place, and time. She has normal reflexes. No cranial nerve deficit.  Skin: Skin is warm and dry. She is not diaphoretic.  Psychiatric: She has a normal mood and affect. Her behavior is normal.          Assessment & Plan:  Decreased endurance Chronic back pain and seeing Ramos Discussed glucosamine chondroitin Dsicussed gluten Stable blood pressure  Prolong QT stable  Sleep patterns  Weight loss Gluten free diet

## 2012-01-16 NOTE — Patient Instructions (Addendum)
The patient is instructed to continue all medications as prescribed. Schedule followup with check out clerk upon leaving the clinic  

## 2012-01-31 ENCOUNTER — Telehealth: Payer: Self-pay | Admitting: Internal Medicine

## 2012-01-31 ENCOUNTER — Other Ambulatory Visit: Payer: Self-pay | Admitting: Internal Medicine

## 2012-01-31 NOTE — Telephone Encounter (Signed)
Caller: Dawn Howard/Patient; Patient Name: Dawn Howard; PCP: Darryll Capers (Adults only); Best Callback Phone Number: [phone]; Call regarding: Labwork; labs drawn during office visit on 01/16/12; says she has not heard anything; went over the labs with Jami; information given on Triglycerides from webmd, including causes and ways to lower her number

## 2012-04-22 ENCOUNTER — Ambulatory Visit (INDEPENDENT_AMBULATORY_CARE_PROVIDER_SITE_OTHER): Payer: BC Managed Care – PPO | Admitting: Internal Medicine

## 2012-04-22 ENCOUNTER — Encounter: Payer: Self-pay | Admitting: Internal Medicine

## 2012-04-22 VITALS — BP 130/80 | HR 72 | Temp 98.2°F | Resp 16 | Ht 63.0 in | Wt 178.0 lb

## 2012-04-22 DIAGNOSIS — F411 Generalized anxiety disorder: Secondary | ICD-10-CM

## 2012-04-22 DIAGNOSIS — J329 Chronic sinusitis, unspecified: Secondary | ICD-10-CM | POA: Diagnosis not present

## 2012-04-22 DIAGNOSIS — G4700 Insomnia, unspecified: Secondary | ICD-10-CM | POA: Diagnosis not present

## 2012-04-22 DIAGNOSIS — I4581 Long QT syndrome: Secondary | ICD-10-CM

## 2012-04-22 MED ORDER — SULFAMETHOXAZOLE-TRIMETHOPRIM 800-160 MG PO TABS: 1.0000 | ORAL_TABLET | Freq: Two times a day (BID) | ORAL | Status: DC

## 2012-04-22 NOTE — Progress Notes (Signed)
Subjective:    Patient ID: Dawn Howard, female    DOB: [birth year], 61 y.o.   MRN: [id number]  HPI Having issues with cost of medications When she cannot afford the clonopin and the effexor she has withdrawal symptoms She has noted increased pressures from family about her medications HTN stable  congestion Increased cough and chest tightness   Review of Systems  Constitutional: Negative for activity change, appetite change and fatigue.  HENT: Negative for ear pain, congestion, neck pain, postnasal drip and sinus pressure.   Eyes: Negative for redness and visual disturbance.  Respiratory: Negative for cough, shortness of breath and wheezing.   Cardiovascular: Positive for palpitations.  Gastrointestinal: Negative for abdominal pain and abdominal distention.  Genitourinary: Negative for dysuria, frequency and menstrual problem.  Musculoskeletal: Negative for myalgias, joint swelling and arthralgias.  Skin: Negative for rash and wound.  Neurological: Negative for dizziness, weakness and headaches.  Hematological: Negative for adenopathy. Does not bruise/bleed easily.  Psychiatric/Behavioral: Positive for sleep disturbance. Negative for decreased concentration. The patient is nervous/anxious.    Past Medical History  Diagnosis Date  . GERD (gastroesophageal reflux disease)   . Hypertension   . 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 Syncope   . Asthma   . Low back pain   . Arthritis     History   Social History  . Marital Status: Married    Spouse Name: N/A    Number of Children: N/A  . Years of Education: N/A   Occupational History  . retired    Social History Main Topics  . Smoking status: Never Smoker   . Smokeless tobacco: Not on file  . Alcohol Use: Yes  . Drug Use: No  . Sexually Active: Not on file   Other Topics Concern  . Not on file   Social History Narrative  . No narrative on file    Past Surgical History  Procedure Date  . Left breast tumor  removed- benign   . Tonsillectomy   . Cervical injections     Family History  Problem Relation Age of Onset  . COPD Mother   . Hypertension Mother   . Heart disease Mother     Allergies  Allergen Reactions  . Acetaminophen Hives    Break out  . Codeine   . Diazepam   . Diphenhydramine Hcl   . Penicillins     Current Outpatient Prescriptions on File Prior to Visit  Medication Sig Dispense Refill  . acebutolol (SECTRAL) 400 MG capsule Take 400 mg by mouth daily.      Marland Kitchen atorvastatin (LIPITOR) 20 MG tablet Take 20 mg by mouth daily.      . butalbital-acetaminophen-caffeine (ESGIC PLUS) 50-500-40 MG per tablet Take 1 tablet by mouth every 4 (four) hours as needed.        . clonazePAM (KLONOPIN) 1 MG tablet 1/2 two times a day and 1 tab a bedtime  180 tablet  1  . esomeprazole (NEXIUM) 40 MG capsule Take 40 mg by mouth daily before breakfast.      . HYDROcodone-ibuprofen (VICOPROFEN) 7.5-200 MG per tablet Take 1 tablet by mouth every 8 (eight) hours as needed.        . mometasone (NASONEX) 50 MCG/ACT nasal spray 2 sprays by Nasal route daily as needed.        Marland Kitchen olmesartan (BENICAR) 40 MG tablet Take 40 mg by mouth daily.        . Scar Treatment Products [facility])  GEL Apply 1 application topically once.  20 g  0  . triamterene-hydrochlorothiazide (DYAZIDE) 37.5-25 MG per capsule TAKE ONE CAPSULE BY MOUTH TWICE A DAY  180 capsule  2  . venlafaxine (EFFEXOR XR) 150 MG 24 hr capsule Take 1 capsule (150 mg total) by mouth daily.  30 capsule  11  . Multiple Vitamin (MULITIVITAMIN WITH MINERALS) TABS Take 1 tablet by mouth daily.        BP 130/80  Pulse 72  Temp 98.2 F (36.8 C)  Resp 16  Ht 5\' 3"  (1.6 m)  Wt 178 lb (80.74 kg)  BMI 31.53 kg/m2       Objective:   Physical Exam  Nursing note and vitals reviewed. Constitutional: She is oriented to person, place, and time. She appears well-developed and well-nourished. No distress.  HENT:  Head: Normocephalic and atraumatic.   Right Ear: External ear normal.  Left Ear: External ear normal.  Nose: Nose normal.  Mouth/Throat: Oropharynx is clear and moist.  Eyes: Conjunctivae normal and EOM are normal. Pupils are equal, round, and reactive to light.  Neck: Normal range of motion. Neck supple. No JVD present. No tracheal deviation present. No thyromegaly present.  Cardiovascular: Normal rate, regular rhythm and intact distal pulses.   Murmur heard. Pulmonary/Chest: Effort normal and breath sounds normal. She has no wheezes. She exhibits no tenderness.  Abdominal: Soft. Bowel sounds are normal.  Musculoskeletal: Normal range of motion. She exhibits no edema and no tenderness.  Lymphadenopathy:    She has no cervical adenopathy.  Neurological: She is alert and oriented to person, place, and time. She has normal reflexes. No cranial nerve deficit.  Skin: Skin is warm and dry. She is not diaphoretic.  Psychiatric:       Increased anxiety          Assessment & Plan:  Increased panic and anxiety and some intermittent panic attacks Blood pressure stable Prolong QT with some episodes of syncope She is concerned with the use of benzodiazepine Mood is variable Has not been in counseling URI with bronchitis Septra DS  rx given

## 2012-05-02 DIAGNOSIS — M5137 Other intervertebral disc degeneration, lumbosacral region: Secondary | ICD-10-CM | POA: Diagnosis not present

## 2012-05-03 ENCOUNTER — Telehealth: Payer: Self-pay | Admitting: Internal Medicine

## 2012-05-03 NOTE — Telephone Encounter (Signed)
We have no way to know. Sorry-

## 2012-05-03 NOTE — Telephone Encounter (Signed)
Pt would like to know  who did her neck injections in maybe 10 yrs ago? Dr Lovell Sheehan sent her for this and she feels like Dr Lovell Sheehan will remember. Dr Ethelene Hal needs to know to access possible lower back injections.

## 2012-05-08 LAB — HM PAP SMEAR

## 2012-06-19 ENCOUNTER — Other Ambulatory Visit: Payer: Self-pay | Admitting: Internal Medicine

## 2012-07-05 ENCOUNTER — Other Ambulatory Visit: Payer: Self-pay | Admitting: Internal Medicine

## 2012-08-05 DIAGNOSIS — H2510 Age-related nuclear cataract, unspecified eye: Secondary | ICD-10-CM | POA: Diagnosis not present

## 2012-08-05 DIAGNOSIS — D3130 Benign neoplasm of unspecified choroid: Secondary | ICD-10-CM | POA: Diagnosis not present

## 2012-08-19 ENCOUNTER — Encounter: Payer: Self-pay | Admitting: Internal Medicine

## 2012-08-19 ENCOUNTER — Ambulatory Visit (INDEPENDENT_AMBULATORY_CARE_PROVIDER_SITE_OTHER): Payer: Medicare Other | Admitting: Internal Medicine

## 2012-08-19 VITALS — BP 126/80 | HR 72 | Temp 98.6°F | Resp 16 | Ht 63.0 in | Wt 182.0 lb

## 2012-08-19 DIAGNOSIS — E785 Hyperlipidemia, unspecified: Secondary | ICD-10-CM

## 2012-08-19 DIAGNOSIS — R42 Dizziness and giddiness: Secondary | ICD-10-CM

## 2012-08-19 DIAGNOSIS — I10 Essential (primary) hypertension: Secondary | ICD-10-CM

## 2012-08-19 DIAGNOSIS — I4581 Long QT syndrome: Secondary | ICD-10-CM

## 2012-08-19 DIAGNOSIS — J45909 Unspecified asthma, uncomplicated: Secondary | ICD-10-CM

## 2012-08-19 LAB — CBC WITH DIFFERENTIAL/PLATELET
Basophils Absolute: 0 10*3/uL (ref 0.0–0.1)
Eosinophils Absolute: 0.1 10*3/uL (ref 0.0–0.7)
Lymphocytes Relative: 23.4 % (ref 12.0–46.0)
MCHC: 34.1 g/dL (ref 30.0–36.0)
Monocytes Relative: 6.1 % (ref 3.0–12.0)
Neutrophils Relative %: 68.5 % (ref 43.0–77.0)
Platelets: 256 10*3/uL (ref 150.0–400.0)
RDW: 13.6 % (ref 11.5–14.6)

## 2012-08-19 LAB — BASIC METABOLIC PANEL
BUN: 28 mg/dL — ABNORMAL HIGH (ref 6–23)
CO2: 24 mEq/L (ref 19–32)
Calcium: 9.8 mg/dL (ref 8.4–10.5)
Creatinine, Ser: 1 mg/dL (ref 0.4–1.2)
GFR: 56.69 mL/min — ABNORMAL LOW (ref >60.00)
Glucose, Bld: 91 mg/dL (ref 70–99)
Sodium: 136 mEq/L (ref 135–145)

## 2012-08-19 LAB — TSH: TSH: 1.13 u[IU]/mL (ref 0.35–5.50)

## 2012-08-19 NOTE — Patient Instructions (Addendum)
The patient is instructed to continue all medications as prescribed. Schedule followup with check out clerk upon leaving the clinic  

## 2012-08-19 NOTE — Progress Notes (Signed)
Subjective:    Patient ID: Dawn Howard, female    DOB: [birth year], 62 y.o.   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  HENT: Negative for ear pain, congestion, neck pain, postnasal drip and sinus pressure.   Eyes: Negative for redness and visual disturbance.  Respiratory: Negative for cough, shortness of breath and wheezing.   Gastrointestinal: Negative for abdominal pain and abdominal distention.  Genitourinary: Negative for dysuria, frequency and menstrual problem.  Musculoskeletal: Negative for myalgias, joint swelling and arthralgias.  Skin: Negative for rash and wound.  Neurological: Negative for dizziness, weakness and headaches.  Hematological: Negative for adenopathy. Does not bruise/bleed easily.  Psychiatric/Behavioral: Negative for sleep disturbance and decreased concentration.   Past Medical History  Diagnosis Date  . GERD (gastroesophageal reflux disease)   . Hypertension   . 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 Syncope   . Asthma   . Low back pain   . Arthritis     History   Social History  . Marital Status: Married    Spouse Name: N/A    Number of Children: N/A  . Years of Education: N/A   Occupational History  . retired    Social History Main Topics  . Smoking status: Never Smoker   . Smokeless tobacco: Not on file  . Alcohol Use: Yes  . Drug Use: No  . Sexually Active: Not on file   Other Topics Concern  . Not on file   Social History Narrative  . No narrative on file    Past Surgical History  Procedure Laterality Date  . Left breast tumor removed- benign    . Tonsillectomy    . Cervical injections      Family History  Problem Relation Age of Onset  . COPD Mother   . Hypertension Mother   . Heart disease  Mother     Allergies  Allergen Reactions  . Acetaminophen Hives    Break out  . Codeine   . Diazepam   . Diphenhydramine Hcl   . Penicillins     Current Outpatient Prescriptions on File Prior to Visit  Medication Sig Dispense Refill  . acebutolol (SECTRAL) 400 MG capsule Take 400 mg by mouth daily.      . butalbital-acetaminophen-caffeine (ESGIC PLUS) 50-500-40 MG per tablet Take 1 tablet by mouth every 4 (four) hours as needed.        Marland Kitchen esomeprazole (NEXIUM) 40 MG capsule Take 40 mg by mouth daily before breakfast.      . HYDROcodone-ibuprofen (VICOPROFEN) 7.5-200 MG per tablet Take 1 tablet by mouth every 8 (eight) hours as needed.        . mometasone (NASONEX) 50 MCG/ACT nasal spray 2 sprays by Nasal route daily as needed.        . Multiple Vitamin (MULITIVITAMIN WITH MINERALS) TABS Take 1 tablet by mouth daily.      Marland Kitchen olmesartan (BENICAR) 40 MG tablet Take 40 mg by mouth daily.        . Scar Treatment Products (MEDERMA) GEL Apply 1 application topically once.  20 g  0  . sulfamethoxazole-trimethoprim (BACTRIM DS,SEPTRA DS) 800-160 MG per tablet Take 1 tablet by mouth 2 (two) times daily.  20 tablet  0  . triamterene-hydrochlorothiazide (DYAZIDE)  37.5-25 MG per capsule TAKE ONE CAPSULE BY MOUTH TWICE A DAY  180 capsule  2  . atorvastatin (LIPITOR) 20 MG tablet TAKE 1 TABLET EVERY DAY  90 tablet  2  . clonazePAM (KLONOPIN) 1 MG tablet TAKE 1/2 TABLET 2 TIMES A DAY & 1 TABLET AT BEDTIME  180 tablet  1  . venlafaxine XR (EFFEXOR-XR) 150 MG 24 hr capsule TAKE ONE CAPSULE EVERY DAY  30 capsule  10   No current facility-administered medications on file prior to visit.    BP 126/80  Pulse 72  Temp(Src) 98.6 F (37 C)  Resp 16  Ht 5\' 3"  (1.6 m)  Wt 182 lb (82.555 kg)  BMI 32.25 kg/m2        Objective:   Physical Exam  Nursing note and vitals reviewed. Constitutional: She is oriented to person, place, and time. She appears well-developed and well-nourished. No distress.   HENT:  Head: Normocephalic and atraumatic.  Right Ear: External ear normal.  Left Ear: External ear normal.  Nose: Nose normal.  Mouth/Throat: Oropharynx is clear and moist.  Eyes: Conjunctivae and EOM are normal. Pupils are equal, round, and reactive to light.  Neck: Normal range of motion. Neck supple. No JVD present. No tracheal deviation present. No thyromegaly present.  Cardiovascular: Normal rate, regular rhythm, normal heart sounds and intact distal pulses.   No murmur heard. Pulmonary/Chest: Effort normal and breath sounds normal. She has no wheezes. She exhibits no tenderness.  Abdominal: Soft. Bowel sounds are normal.  Musculoskeletal: Normal range of motion. She exhibits no edema and no tenderness.  Lymphadenopathy:    She has no cervical adenopathy.  Neurological: She is alert and oriented to person, place, and time. She has normal reflexes. No cranial nerve deficit.  Skin: Skin is warm and dry. She is not diaphoretic.  Psychiatric: She has a normal mood and affect. Her behavior is normal.          Assessment & Plan:  Concerned about use of benicar but has stable renal and blood pressure Reassured lipitor may be the main cause of the leg cramps GERD on nexium stable Discussed weight loss strategies  I have spent more than 30 minutes examining this patient face-to-face of which over half was spent in counseling

## 2012-08-27 ENCOUNTER — Telehealth: Payer: Self-pay | Admitting: Internal Medicine

## 2012-08-27 NOTE — Telephone Encounter (Signed)
Pt instructed she needs to drink more water- pt informed. Will check bmet at next ov

## 2012-08-27 NOTE — Telephone Encounter (Addendum)
[facility] Triage Call Report Triage Record Num: [id number] Operator: Frederico Hamman Patient Name: Keilynn Marano Call Date & Time: 08/26/2012 5:51:40PM Patient Phone: [phone] PCP: Darryll Capers Patient Gender: Female PCP Fax : [phone] Patient DOB: 1950/10/09 Practice Name: [facility] Reason for Call: Caller: Dell/Patient; PCP: Darryll Capers (Adults only); CB#: [phone]; Call regarding Labs; Female calling for results of labs done on 08/19/12. Triager told her CBC and TSH were normal. Did not discuss BMP- has elevated BUN and GFR. Denelle asked if her glucose was normal- which is was. Was seen in office due to dizziness. States she has episodes at 1530 every day of dizziness and extreme fatigue. Declined triage. No symptoms at present.Does not check BP or glucose whern she is symptomatic. Requesting callback from Dr. Lovell Sheehan on 08/28/12 regarding lab results and symptoms. States Dr. Lovell Sheehan is off on 08/27/12. Natalea can be reached at [phone]. Message sent to office. Protocol(s) Used: Office Note Recommended Outcome per Protocol: Information Noted and Sent to Office Reason for Outcome: Caller information to office Care Advice: ~ [address] [place], [place] [address] p. [phone] f. [phone] To: [facility] (After Hours Triage) Fax: [phone] From: [facility] Date/ Time: 08/26/2012 6:26 PM Taken By: Jethro BolusDarl Pikes Facility: not collected Patient: Shanda, Cadotte DOB: [birth year] Phone: [phone] Reason for Call: Adeliz was seen in office on 08/19/12 - concerned about dizziness and vertigo. Requesting results of lab works. Advised her CBC and TSH were normal. Did not discuss elevated BUN or rest of BMP. States she was out of state over the weekend. Has episodes of dizziness at 1530 everyday. No complaints at present . Declined triage. States daughter has history of hypoglycemia. Aliyana does not check Blood pressure or  blood glucose when she is symptomatic. Would like callback from Dr. Lovell Sheehan on 08/28/12 regarding labs and her symptoms at [phone].

## 2012-09-11 ENCOUNTER — Other Ambulatory Visit: Payer: Self-pay | Admitting: Internal Medicine

## 2012-12-02 ENCOUNTER — Ambulatory Visit: Payer: Medicare Other | Admitting: Internal Medicine

## 2013-01-27 ENCOUNTER — Telehealth: Payer: Self-pay | Admitting: Internal Medicine

## 2013-01-27 ENCOUNTER — Ambulatory Visit (INDEPENDENT_AMBULATORY_CARE_PROVIDER_SITE_OTHER): Payer: Medicare Other | Admitting: Internal Medicine

## 2013-01-27 ENCOUNTER — Encounter: Payer: Self-pay | Admitting: Internal Medicine

## 2013-01-27 VITALS — BP 130/80 | HR 72 | Temp 98.2°F | Resp 16 | Ht 63.0 in | Wt 155.0 lb

## 2013-01-27 DIAGNOSIS — Z5189 Encounter for other specified aftercare: Secondary | ICD-10-CM

## 2013-01-27 DIAGNOSIS — I10 Essential (primary) hypertension: Secondary | ICD-10-CM | POA: Diagnosis not present

## 2013-01-27 DIAGNOSIS — E785 Hyperlipidemia, unspecified: Secondary | ICD-10-CM

## 2013-01-27 DIAGNOSIS — F411 Generalized anxiety disorder: Secondary | ICD-10-CM

## 2013-01-27 DIAGNOSIS — R52 Pain, unspecified: Secondary | ICD-10-CM

## 2013-01-27 LAB — COMPREHENSIVE METABOLIC PANEL
ALT: 22 U/L (ref 0–35)
AST: 18 U/L (ref 0–37)
Albumin: 3.8 g/dL (ref 3.5–5.2)
Alkaline Phosphatase: 85 U/L (ref 39–117)
Calcium: 10 mg/dL (ref 8.4–10.5)
Chloride: 102 mEq/L (ref 96–112)
Potassium: 3.9 mEq/L (ref 3.5–5.1)

## 2013-01-27 LAB — LIPID PANEL
Cholesterol: 228 mg/dL — ABNORMAL HIGH (ref 0–200)
Total CHOL/HDL Ratio: 4
Triglycerides: 210 mg/dL — ABNORMAL HIGH (ref 0.0–149.0)
VLDL: 42 mg/dL — ABNORMAL HIGH (ref 0.0–40.0)

## 2013-01-27 MED ORDER — TRAMADOL HCL 50 MG PO TABS: 50.0000 mg | ORAL_TABLET | Freq: Three times a day (TID) | ORAL | Status: DC | PRN

## 2013-01-27 MED ORDER — CETIRIZINE HCL 10 MG PO TABS: 10.0000 mg | ORAL_TABLET | Freq: Every day | ORAL | Status: DC

## 2013-01-27 MED ORDER — ATENOLOL 25 MG PO TABS: 25.0000 mg | ORAL_TABLET | Freq: Every day | ORAL | Status: DC

## 2013-01-27 MED ORDER — IBUPROFEN 800 MG PO TABS: 800.0000 mg | ORAL_TABLET | Freq: Three times a day (TID) | ORAL | Status: DC | PRN

## 2013-01-27 NOTE — Telephone Encounter (Signed)
Pt was seen today and was advised to return in 3-4 months for a follow-up.  However no appts available during this time.  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  Please advise where pt should be worked in.

## 2013-01-27 NOTE — Patient Instructions (Addendum)
The patient is instructed to continue all medications as prescribed. Schedule followup with check out clerk upon leaving the clinic Take IBU and tramadol for pain up to three times a day   Zyrtec 10 mg at bedtime

## 2013-01-27 NOTE — Telephone Encounter (Signed)
We have opening on jan 25 in am she can use

## 2013-01-27 NOTE — Progress Notes (Signed)
Subjective:    Patient ID: Dawn Howard, female    DOB: 02/21/1951, 61 y.o.   MRN: [id number]  HPI  Has been on BB and diazide for blood pressure Trial of atenolol  25 mg   Review of Systems  Constitutional: Negative for activity change, appetite change and fatigue.  HENT: Negative for ear pain, congestion, neck pain, postnasal drip and sinus pressure.   Eyes: Negative for redness and visual disturbance.  Respiratory: Negative for cough, shortness of breath and wheezing.   Gastrointestinal: Negative for abdominal pain and abdominal distention.  Genitourinary: Negative for dysuria, frequency and menstrual problem.  Musculoskeletal: Negative for myalgias, joint swelling and arthralgias.  Skin: Negative for rash and wound.  Neurological: Negative for dizziness, weakness and headaches.  Hematological: Negative for adenopathy. Does not bruise/bleed easily.  Psychiatric/Behavioral: Negative for sleep disturbance and decreased concentration.   Past Medical History  Diagnosis Date  . GERD (gastroesophageal reflux disease)   . Hypertension   . 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 Syncope   . Asthma   . Low back pain   . Arthritis     History   Social History  . Marital Status: Married    Spouse Name: N/A    Number of Children: N/A  . Years of Education: N/A   Occupational History  . retired    Social History Main Topics  . Smoking status: Never Smoker   . Smokeless tobacco: Not on file  . Alcohol Use: Yes  . Drug Use: No  . Sexual Activity: Not on file   Other Topics Concern  . Not on file   Social History Narrative  . No narrative on file    Past Surgical History  Procedure Laterality Date  . Left breast tumor removed- benign    . Tonsillectomy    . Cervical injections      Family History  Problem Relation Age of Onset  . COPD Mother   . Hypertension Mother   . Heart disease Mother     Allergies  Allergen Reactions  . Acetaminophen Hives    Break out   . Codeine   . Diazepam   . Diphenhydramine Hcl   . Penicillins     Current Outpatient Prescriptions on File Prior to Visit  Medication Sig Dispense Refill  . acebutolol (SECTRAL) 400 MG capsule Take 400 mg by mouth daily.      Marland Kitchen atorvastatin (LIPITOR) 20 MG tablet TAKE 1 TABLET EVERY DAY  90 tablet  2  . butalbital-acetaminophen-caffeine (ESGIC PLUS) 50-500-40 MG per tablet Take 1 tablet by mouth every 4 (four) hours as needed.        . clonazePAM (KLONOPIN) 1 MG tablet TAKE 1/2 TABLET 2 TIMES A DAY & 1 TABLET AT BEDTIME  180 tablet  1  . esomeprazole (NEXIUM) 40 MG capsule Take 40 mg by mouth daily before breakfast.      . HYDROcodone-ibuprofen (VICOPROFEN) 7.5-200 MG per tablet Take 1 tablet by mouth every 8 (eight) hours as needed.        . mometasone (NASONEX) 50 MCG/ACT nasal spray 2 sprays by Nasal route daily as needed.        . Multiple Vitamin (MULITIVITAMIN WITH MINERALS) TABS Take 1 tablet by mouth daily.      Marland Kitchen olmesartan (BENICAR) 40 MG tablet Take 40 mg by mouth daily.        . Scar Treatment Products (MEDERMA) GEL Apply 1 application topically once.  20 g  0  . triamterene-hydrochlorothiazide (DYAZIDE) 37.5-25 MG per capsule TAKE ONE CAPSULE BY MOUTH TWICE A DAY  180 capsule  2  . venlafaxine XR (EFFEXOR-XR) 150 MG 24 hr capsule TAKE ONE CAPSULE EVERY DAY  30 capsule  10   No current facility-administered medications on file prior to visit.    BP 130/80  Pulse 72  Temp(Src) 98.2 F (36.8 C)  Resp 16  Ht 5\' 3"  (1.6 m)  Wt 155 lb (70.308 kg)  BMI 27.46 kg/m2       Objective:   Physical Exam  Constitutional: She is oriented to person, place, and time. She appears well-developed and well-nourished. No distress.  HENT:  Head: Normocephalic and atraumatic.  Right Ear: External ear normal.  Left Ear: External ear normal.  Nose: Nose normal.  Mouth/Throat: Oropharynx is clear and moist.  Eyes: Conjunctivae and EOM are normal. Pupils are equal, round, and reactive  to light.  Neck: Normal range of motion. Neck supple. No JVD present. No tracheal deviation present. No thyromegaly present.  Cardiovascular: Normal rate, regular rhythm, normal heart sounds and intact distal pulses.   No murmur heard. Pulmonary/Chest: Effort normal and breath sounds normal. She has no wheezes. She exhibits no tenderness.  Abdominal: Soft. Bowel sounds are normal.  Musculoskeletal: Normal range of motion. She exhibits no edema and no tenderness.  Lymphadenopathy:    She has no cervical adenopathy.  Neurological: She is alert and oriented to person, place, and time. She has normal reflexes. No cranial nerve deficit.  Skin: Skin is warm and dry. She is not diaphoretic.  Psychiatric: She has a normal mood and affect. Her behavior is normal.          Assessment & Plan:  Change the blood pressure to atenolol for anxiety and blood pressure was on lopressor in rehab reviewed and eliminated medications Monitor lipids  I have spent more than 30 minutes examining this patient face-to-face of which over half was spent in counseling

## 2013-01-28 LAB — LDL CHOLESTEROL, DIRECT: Direct LDL: 145.3 mg/dL

## 2013-02-03 ENCOUNTER — Telehealth: Payer: Self-pay | Admitting: Internal Medicine

## 2013-02-03 DIAGNOSIS — I10 Essential (primary) hypertension: Secondary | ICD-10-CM

## 2013-02-03 NOTE — Telephone Encounter (Signed)
Per dr Lovell Sheehan  increase atenolol to 50 and give it a few weeks

## 2013-02-03 NOTE — Telephone Encounter (Signed)
Patient states that Dr. Lovell Sheehan has recently changed her meds. The atenolol is not working - she's given it a week, but it gives her horrible headaches, dizziness, fatigue. She states her anxiety or worse, and she feels jumpy all the time. She is not sure if that is due to the Atenolol or not. She said the only meds she is taking are Lipitor, atenolol, and ibuprofen, dyazide, and ultram. She thinks she may need something for her anxiety again. She also would like her latest lab results. She is very concerned about the severe headaches. Please advise and call.

## 2013-02-13 ENCOUNTER — Telehealth: Payer: Self-pay | Admitting: Internal Medicine

## 2013-02-13 DIAGNOSIS — R52 Pain, unspecified: Secondary | ICD-10-CM

## 2013-02-13 MED ORDER — ATENOLOL 50 MG PO TABS: 50.0000 mg | ORAL_TABLET | Freq: Every day | ORAL | Status: DC

## 2013-02-13 MED ORDER — IBUPROFEN 800 MG PO TABS: 800.0000 mg | ORAL_TABLET | Freq: Three times a day (TID) | ORAL | Status: DC | PRN

## 2013-02-13 MED ORDER — ATORVASTATIN CALCIUM 20 MG PO TABS: ORAL_TABLET | ORAL | Status: DC

## 2013-02-13 NOTE — Telephone Encounter (Signed)
Talked with pt and she states she had some chest pain last week with sob that last for several minutes- she states she thought it was anxiety-- states no further chest pain-- pt offered office visit here and she states she cant drive and doesn't have transportation.  Instructed to call 911 and go to er if she has chest pain with sob again and she agrees    meds sent in

## 2013-02-13 NOTE — Telephone Encounter (Signed)
Pt needs refills on atorvastatin 20 mg #30,atenolol 50 mg not 25 mg #30 and ibuprofen 800 mg #180 call into [facility] [phone]

## 2013-02-24 ENCOUNTER — Other Ambulatory Visit: Payer: Self-pay | Admitting: Internal Medicine

## 2013-02-25 ENCOUNTER — Ambulatory Visit (INDEPENDENT_AMBULATORY_CARE_PROVIDER_SITE_OTHER): Payer: Medicare Other

## 2013-02-25 ENCOUNTER — Encounter: Payer: Self-pay | Admitting: Internal Medicine

## 2013-02-25 DIAGNOSIS — Z23 Encounter for immunization: Secondary | ICD-10-CM | POA: Diagnosis not present

## 2013-05-30 ENCOUNTER — Telehealth: Payer: Self-pay | Admitting: Internal Medicine

## 2013-05-30 NOTE — Telephone Encounter (Signed)
[facility] Triage Call Report Triage Record Num: [id number] Operator: Noemi Chapel Patient Name: Dawn Howard Call Date & Time: 05/29/2013 5:17:43PM Patient Phone: [phone] PCP: Benay Pillow Patient Gender: Female PCP Fax : [phone] Patient DOB: 1950-05-22 Practice Name: [facility] Reason for Call: Caller: Dawn Howard/Patient; PCP: Benay Pillow (Adults only); CB#: [id number]; Call regarding Cold sxs, wants to what she is allergic to, what she can take. She has an appointment with PCP on Monday 06/02/13. Caller reports cold symptoms that began on Wed 1/21 and include stuffy nose, congestion, headache and watery eyes. A little bit of a cough, and some sneezing. Afebrile. Caller asking what she can take for symptoms since she is allergic to several medications. EPIC record reviewed. Per Upper Respiratory Infection Protocol, New onset of the following symptoms: Nasal Congestion, runny nose, sneezing, itchy or mild sore throat. May also have cough, irritated eyes or mild headache or low grade fever up to 101.5. Caller given home care and advised to get Coricidin HBP as a decongestant, and take Ibuprofen as currently directed. Caller advised to callback as needed and is agreeable. Protocol(s) Used: Upper Respiratory Infection (URI) Recommended Outcome per Protocol: Provide Home/Self Care Reason for Outcome: New onset of the following symptoms: nasal congestion; runny nose; sneezing; itchy or mild sore throat. May also have cough; irritated eyes or a mild headache or a low grade fever up to 101.5 F (38.6C). Care Advice: ~ Use a cool mist humidifier to moisten air. Be sure to clean according to manufacturer's instructions. ~ Call provider if symptoms worsen or new symptoms develop. ~ Consider use of a saline nasal spray per package directions to help relieve nasal congestion. Drink more fluids -- water, low-sugar juices, tea and warm soup, especially chicken broth, are options.  Avoid caffeinated or alcoholic beverages because they can increase the chance of dehydration. ~ Mild symptoms of a cold can be expected to last 7 to 10 days. Sometimes, a cough associated with a cold can last up to 3 weeks. Over-the-counter cold medications may temporarily relieve the symptoms, but do not shorten the length of the cold. ~ A warm, moist compress placed on face, over eyes for 15 to 20 minutes, 5 to 6 times a day, may help relieve the congestion. ~ Consider nonprescription decongestant (Sudafed, Drixoral) for relief of symptoms after checking with a provider, especially if there is a history of hypertension, hyperthyroidism, heart disease, diabetes, glaucoma, urinary retention caused by prostatic hypertrophy. ~ ~ Rest until symptoms improve. If more than [redacted] weeks pregnant, lie on left side when resting. Be aware that some nonprescription drugs contain both an antihistamine and decongestant. If taking a combination drug, do not take an additional decongestant. ~ Sore Throat Relief: - Use salt water gargles (1/2 teaspoon salt in 8 oz. [245mL] warm water) every one to two hours. - Use a vaporizer or cool mist humidifier in the room when sleeping. - Suck on hard candy, nonprescription or herbal throat lozenges (sugar-free if diabetic) - Eat soothing, soft food/fluids (broths, soups, or honey and lemon juice in hot tea, Popsicles, frozen yogurt or sherbet, scrambled eggs, cooked cereals, Jell-O or puddings) whichever is most comforting. - Avoid eating salty, spicy or acidic foods. ~

## 2013-06-02 ENCOUNTER — Encounter: Payer: Self-pay | Admitting: Internal Medicine

## 2013-06-02 ENCOUNTER — Ambulatory Visit (INDEPENDENT_AMBULATORY_CARE_PROVIDER_SITE_OTHER): Payer: Medicare Other | Admitting: Internal Medicine

## 2013-06-02 VITALS — BP 120/70 | HR 76 | Temp 98.2°F | Resp 16 | Ht 66.0 in | Wt 155.0 lb

## 2013-06-02 DIAGNOSIS — R51 Headache: Secondary | ICD-10-CM

## 2013-06-02 DIAGNOSIS — F4323 Adjustment disorder with mixed anxiety and depressed mood: Secondary | ICD-10-CM

## 2013-06-02 DIAGNOSIS — J209 Acute bronchitis, unspecified: Secondary | ICD-10-CM | POA: Diagnosis not present

## 2013-06-02 DIAGNOSIS — I4581 Long QT syndrome: Secondary | ICD-10-CM

## 2013-06-02 DIAGNOSIS — M549 Dorsalgia, unspecified: Secondary | ICD-10-CM

## 2013-06-02 DIAGNOSIS — I10 Essential (primary) hypertension: Secondary | ICD-10-CM | POA: Diagnosis not present

## 2013-06-02 MED ORDER — BUTALBITAL-APAP-CAFFEINE 50-325-40 MG PO TABS: 1.0000 | ORAL_TABLET | Freq: Four times a day (QID) | ORAL | Status: DC | PRN

## 2013-06-02 MED ORDER — HYDROCOD POLST-CHLORPHEN POLST 10-8 MG/5ML PO LQCR: 5.0000 mL | Freq: Two times a day (BID) | ORAL | Status: DC | PRN

## 2013-06-02 MED ORDER — MIRTAZAPINE 30 MG PO TABS: 30.0000 mg | ORAL_TABLET | Freq: Every day | ORAL | Status: DC

## 2013-06-02 MED ORDER — CARVEDILOL 12.5 MG PO TABS: 12.5000 mg | ORAL_TABLET | Freq: Two times a day (BID) | ORAL | Status: DC

## 2013-06-02 NOTE — Progress Notes (Signed)
   Subjective:    Patient ID: Dawn Howard, female    DOB: 1951-02-09, 63 y.o.   MRN: [id number]  HPI  One week history of cough and congestion with low grade fever Has been off the tramadol and many of the psych medications Dr Nelva Bush gave her back medications that "help"     Review of Systems     Objective:   Physical Exam        Assessment & Plan:

## 2013-06-02 NOTE — Patient Instructions (Addendum)
Need to get PAP and mamogram  I have switched the atenolol to coreg for your blood pressure and heart rate. You will feel better on this!  Mucinex    Stop the melatonin  remeron  At bed time for sleep and anxiety as well as migraines

## 2013-06-02 NOTE — Progress Notes (Signed)
Pre visit review using our clinic review tool, if applicable. No additional management support is needed unless otherwise documented below in the visit note. 

## 2013-06-09 ENCOUNTER — Ambulatory Visit: Payer: Medicare Other | Admitting: Internal Medicine

## 2013-06-10 DIAGNOSIS — M5137 Other intervertebral disc degeneration, lumbosacral region: Secondary | ICD-10-CM | POA: Diagnosis not present

## 2013-06-10 DIAGNOSIS — G894 Chronic pain syndrome: Secondary | ICD-10-CM | POA: Diagnosis not present

## 2013-06-10 DIAGNOSIS — Z79899 Other long term (current) drug therapy: Secondary | ICD-10-CM | POA: Diagnosis not present

## 2013-06-11 ENCOUNTER — Telehealth: Payer: Self-pay | Admitting: Internal Medicine

## 2013-06-11 NOTE — Telephone Encounter (Signed)
Relevant patient education mailed to patient.  

## 2013-06-20 ENCOUNTER — Ambulatory Visit: Payer: Medicare Other | Admitting: Family

## 2013-07-18 ENCOUNTER — Other Ambulatory Visit: Payer: Self-pay | Admitting: *Deleted

## 2013-07-18 MED ORDER — BUTALBITAL-ASA-CAFFEINE 50-325-40 MG PO CAPS: 1.0000 | ORAL_CAPSULE | Freq: Four times a day (QID) | ORAL | Status: DC | PRN

## 2013-08-05 DIAGNOSIS — H25019 Cortical age-related cataract, unspecified eye: Secondary | ICD-10-CM | POA: Diagnosis not present

## 2013-08-05 DIAGNOSIS — D3130 Benign neoplasm of unspecified choroid: Secondary | ICD-10-CM | POA: Diagnosis not present

## 2013-08-05 DIAGNOSIS — H01029 Squamous blepharitis unspecified eye, unspecified eyelid: Secondary | ICD-10-CM | POA: Diagnosis not present

## 2013-08-05 DIAGNOSIS — H1045 Other chronic allergic conjunctivitis: Secondary | ICD-10-CM | POA: Diagnosis not present

## 2013-08-07 ENCOUNTER — Telehealth: Payer: Self-pay | Admitting: Internal Medicine

## 2013-08-07 ENCOUNTER — Encounter: Payer: Self-pay | Admitting: *Deleted

## 2013-08-07 NOTE — Telephone Encounter (Signed)
[employer] denied butalbital-aspirin-caffeine (FIORINAL) 50-325-40 MG per capsule.  Per Christella Scheuermann, medication is considered a high-risk medication in pt's over age 65.  It is a safety concern and the [place] recommends avoiding these medications in the elderly if possible.  Documentation for the continued use of the HRM must be provided including: (1) a written/verbal explanation of the specific benefit established and how the benefit outweighs the potential risk, (2) outline of the prescriber's ongoing monitoring plan for the HRM, and (3) optimal dosing of 2 safer formulary alternatives (if 2 are available) for at least a period of 30 days each when clinically appropriate.  Medical records documenting the pt's response/intolerance to the safer formulary alternative(s) must be provided.  The safer formulary alternatives to the medication requested include Naproxen.

## 2013-08-07 NOTE — Telephone Encounter (Signed)
The appeal request has been submitted.

## 2013-08-07 NOTE — Telephone Encounter (Signed)
Letter submitted

## 2013-08-13 ENCOUNTER — Telehealth: Payer: Self-pay | Admitting: Internal Medicine

## 2013-08-13 NOTE — Telephone Encounter (Signed)
I received an approval from the appeal submitted. 08/05/13 - 08/06/14.

## 2013-08-13 NOTE — Telephone Encounter (Signed)
Pt's new pharmacy is [facility] at [address] of [address].

## 2013-08-13 NOTE — Telephone Encounter (Signed)
Pharmacy updated in chart

## 2013-08-21 ENCOUNTER — Other Ambulatory Visit: Payer: Self-pay | Admitting: Internal Medicine

## 2013-09-12 DIAGNOSIS — M5137 Other intervertebral disc degeneration, lumbosacral region: Secondary | ICD-10-CM | POA: Diagnosis not present

## 2013-09-12 DIAGNOSIS — M5030 Other cervical disc degeneration, unspecified cervical region: Secondary | ICD-10-CM | POA: Diagnosis not present

## 2013-09-12 DIAGNOSIS — G894 Chronic pain syndrome: Secondary | ICD-10-CM | POA: Diagnosis not present

## 2013-09-12 DIAGNOSIS — Z79899 Other long term (current) drug therapy: Secondary | ICD-10-CM | POA: Diagnosis not present

## 2013-09-15 ENCOUNTER — Telehealth: Payer: Self-pay | Admitting: Internal Medicine

## 2013-09-15 NOTE — Telephone Encounter (Signed)
Pt  Would like to get est with  dr plotnikov per pt md  will accept her as new pt if dr Arnoldo Morale call him directly.

## 2013-09-19 ENCOUNTER — Other Ambulatory Visit: Payer: Self-pay | Admitting: Internal Medicine

## 2013-10-24 ENCOUNTER — Telehealth: Payer: Self-pay | Admitting: Internal Medicine

## 2013-10-24 NOTE — Telephone Encounter (Signed)
Pt called and asked if Dr Arnoldo Morale would call  Dr Alain Marion office and refer her. She said the office told her Dr Alain Marion would except her if Dr Arnoldo Morale made the call

## 2013-11-11 NOTE — Telephone Encounter (Signed)
OK. Thx

## 2013-11-11 NOTE — Telephone Encounter (Signed)
Dr Alain Marion, will you accept this pt and husband?  She and her husband can walk to your office.  Pt states you were an attending physician on an episode at the hospital. She thinks highly of you and she and her husband have both been long time pt's of [facility] (20+yrs)   Thank you!

## 2013-12-23 ENCOUNTER — Ambulatory Visit (INDEPENDENT_AMBULATORY_CARE_PROVIDER_SITE_OTHER): Payer: Medicare Other | Admitting: Internal Medicine

## 2013-12-23 ENCOUNTER — Encounter: Payer: Self-pay | Admitting: Internal Medicine

## 2013-12-23 VITALS — BP 130/80 | HR 80 | Temp 98.4°F | Resp 16 | Ht 63.0 in | Wt 170.0 lb

## 2013-12-23 DIAGNOSIS — Z0000 Encounter for general adult medical examination without abnormal findings: Secondary | ICD-10-CM

## 2013-12-23 DIAGNOSIS — K2100 Gastro-esophageal reflux disease with esophagitis, without bleeding: Secondary | ICD-10-CM

## 2013-12-23 DIAGNOSIS — L03211 Cellulitis of face: Secondary | ICD-10-CM

## 2013-12-23 DIAGNOSIS — L0201 Cutaneous abscess of face: Secondary | ICD-10-CM

## 2013-12-23 DIAGNOSIS — I4581 Long QT syndrome: Secondary | ICD-10-CM

## 2013-12-23 DIAGNOSIS — I10 Essential (primary) hypertension: Secondary | ICD-10-CM

## 2013-12-23 DIAGNOSIS — E785 Hyperlipidemia, unspecified: Secondary | ICD-10-CM | POA: Diagnosis not present

## 2013-12-23 DIAGNOSIS — K047 Periapical abscess without sinus: Secondary | ICD-10-CM

## 2013-12-23 MED ORDER — CARVEDILOL 12.5 MG PO TABS: 12.5000 mg | ORAL_TABLET | Freq: Two times a day (BID) | ORAL | Status: DC

## 2013-12-23 MED ORDER — ATORVASTATIN CALCIUM 20 MG PO TABS: ORAL_TABLET | ORAL | Status: DC

## 2013-12-23 MED ORDER — TRIAMTERENE-HCTZ 37.5-25 MG PO CAPS: ORAL_CAPSULE | ORAL | Status: DC

## 2013-12-23 NOTE — Assessment & Plan Note (Signed)
Continue with current prescription therapy as reflected on the Med list.  

## 2013-12-23 NOTE — Progress Notes (Signed)
Pre visit review using our clinic review tool, if applicable. No additional management support is needed unless otherwise documented below in the visit note. 

## 2013-12-23 NOTE — Assessment & Plan Note (Signed)
2 teeth were extracted

## 2013-12-23 NOTE — Assessment & Plan Note (Addendum)
ENT consult tomorrow to consider I&D POC US revealed a heteroechoic infiltrate with a 1.7x0.9 cm hypoechoic center in the skin of the R cheek. She is on Clinda po now

## 2013-12-23 NOTE — Progress Notes (Signed)
   Subjective:   HPI  Former Dr Arnoldo Morale patient  C/o R cheek abscess that formed first on 12/13/13  The patient needs to address  chronic hypertension that has been well controlled with medicines; to address chronic  hyperlipidemia controlled with medicines as well, HA  Review of Systems     Objective:   Physical Exam    6x4x2.5 cm oval eryth abscess of the R cheek  Procedure Note :    Procedure :   Point of care (POC) sonography examination   Indication: R cheek swelling   Equipment used: Sonosite M-Turbo with HFL38x/13-6 MHz transducer linear probe. The images were stored in the unit and later transferred in storage.  The patient was placed in a sitting position.  This study revealed a heteroechoic infiltrate with a 1.7x0.9 cm hypoechoic center in the skin of the R cheek.   Impression: R cheek abscess       Assessment & Plan:

## 2013-12-24 DIAGNOSIS — L0201 Cutaneous abscess of face: Secondary | ICD-10-CM | POA: Diagnosis not present

## 2013-12-25 ENCOUNTER — Other Ambulatory Visit (INDEPENDENT_AMBULATORY_CARE_PROVIDER_SITE_OTHER): Payer: Medicare Other

## 2013-12-25 DIAGNOSIS — I10 Essential (primary) hypertension: Secondary | ICD-10-CM

## 2013-12-25 DIAGNOSIS — Z79899 Other long term (current) drug therapy: Secondary | ICD-10-CM

## 2013-12-25 DIAGNOSIS — E785 Hyperlipidemia, unspecified: Secondary | ICD-10-CM

## 2013-12-25 DIAGNOSIS — Z0000 Encounter for general adult medical examination without abnormal findings: Secondary | ICD-10-CM

## 2013-12-25 DIAGNOSIS — L03211 Cellulitis of face: Secondary | ICD-10-CM

## 2013-12-25 DIAGNOSIS — L0201 Cutaneous abscess of face: Secondary | ICD-10-CM | POA: Diagnosis not present

## 2013-12-25 LAB — CBC WITH DIFFERENTIAL/PLATELET
Basophils Absolute: 0.1 10*3/uL (ref 0.0–0.1)
Basophils Relative: 0.7 % (ref 0.0–3.0)
EOS PCT: 5 % (ref 0.0–5.0)
Eosinophils Absolute: 0.4 10*3/uL (ref 0.0–0.7)
HEMATOCRIT: 39.3 % (ref 36.0–46.0)
Hemoglobin: 13.4 g/dL (ref 12.0–15.0)
Lymphocytes Relative: 27.1 % (ref 12.0–46.0)
Lymphs Abs: 2.1 10*3/uL (ref 0.7–4.0)
MCHC: 34.1 g/dL (ref 30.0–36.0)
MCV: 89.9 fl (ref 78.0–100.0)
MONOS PCT: 7.8 % (ref 3.0–12.0)
Monocytes Absolute: 0.6 10*3/uL (ref 0.1–1.0)
Neutro Abs: 4.7 10*3/uL (ref 1.4–7.7)
Neutrophils Relative %: 59.4 % (ref 43.0–77.0)
PLATELETS: 347 10*3/uL (ref 150.0–400.0)
RBC: 4.38 Mil/uL (ref 3.87–5.11)
RDW: 12.7 % (ref 11.5–15.5)
WBC: 7.9 10*3/uL (ref 4.0–10.5)

## 2013-12-25 LAB — LIPID PANEL
CHOL/HDL RATIO: 3
Cholesterol: 147 mg/dL (ref 0–200)
HDL: 47.1 mg/dL (ref >39.00)
LDL CALC: 70 mg/dL (ref 0–99)
NONHDL: 99.9
Triglycerides: 152 mg/dL — ABNORMAL HIGH (ref 0.0–149.0)
VLDL: 30.4 mg/dL (ref 0.0–40.0)

## 2013-12-25 LAB — URINALYSIS
BILIRUBIN URINE: NEGATIVE
Ketones, ur: NEGATIVE
LEUKOCYTES UA: NEGATIVE
NITRITE: NEGATIVE
Specific Gravity, Urine: 1.01 (ref 1.000–1.030)
Total Protein, Urine: NEGATIVE
Urine Glucose: NEGATIVE
Urobilinogen, UA: 0.2 (ref 0.0–1.0)
pH: 6.5 (ref 5.0–8.0)

## 2013-12-25 LAB — BASIC METABOLIC PANEL
BUN: 25 mg/dL — ABNORMAL HIGH (ref 6–23)
CO2: 29 mEq/L (ref 19–32)
Calcium: 9.9 mg/dL (ref 8.4–10.5)
Chloride: 102 mEq/L (ref 96–112)
Creatinine, Ser: 1 mg/dL (ref 0.4–1.2)
GFR: 57.08 mL/min — AB (ref >60.00)
Glucose, Bld: 90 mg/dL (ref 70–99)
POTASSIUM: 3.7 meq/L (ref 3.5–5.1)
Sodium: 137 mEq/L (ref 135–145)

## 2013-12-25 LAB — HEPATIC FUNCTION PANEL
ALK PHOS: 100 U/L (ref 39–117)
ALT: 26 U/L (ref 0–35)
AST: 17 U/L (ref 0–37)
Albumin: 3.5 g/dL (ref 3.5–5.2)
BILIRUBIN DIRECT: 0 mg/dL (ref 0.0–0.3)
BILIRUBIN TOTAL: 0.4 mg/dL (ref 0.2–1.2)
Total Protein: 7 g/dL (ref 6.0–8.3)

## 2013-12-25 LAB — TSH: TSH: 1.54 u[IU]/mL (ref 0.35–4.50)

## 2013-12-26 ENCOUNTER — Encounter: Payer: Medicare Other | Admitting: Internal Medicine

## 2013-12-26 DIAGNOSIS — L0201 Cutaneous abscess of face: Secondary | ICD-10-CM | POA: Diagnosis not present

## 2013-12-26 DIAGNOSIS — L03211 Cellulitis of face: Secondary | ICD-10-CM | POA: Diagnosis not present

## 2013-12-30 ENCOUNTER — Encounter: Payer: Self-pay | Admitting: Internal Medicine

## 2013-12-30 ENCOUNTER — Ambulatory Visit (INDEPENDENT_AMBULATORY_CARE_PROVIDER_SITE_OTHER): Payer: Medicare Other | Admitting: Internal Medicine

## 2013-12-30 VITALS — BP 140/82 | HR 102 | Temp 98.3°F | Wt 167.0 lb

## 2013-12-30 DIAGNOSIS — Z0000 Encounter for general adult medical examination without abnormal findings: Secondary | ICD-10-CM | POA: Diagnosis not present

## 2013-12-30 DIAGNOSIS — L03211 Cellulitis of face: Secondary | ICD-10-CM | POA: Diagnosis not present

## 2013-12-30 DIAGNOSIS — M545 Low back pain, unspecified: Secondary | ICD-10-CM

## 2013-12-30 DIAGNOSIS — L0201 Cutaneous abscess of face: Secondary | ICD-10-CM

## 2013-12-30 DIAGNOSIS — Z129 Encounter for screening for malignant neoplasm, site unspecified: Secondary | ICD-10-CM | POA: Diagnosis not present

## 2013-12-30 DIAGNOSIS — K047 Periapical abscess without sinus: Secondary | ICD-10-CM

## 2013-12-30 MED ORDER — ALPRAZOLAM 0.25 MG PO TABS: 0.2500 mg | ORAL_TABLET | Freq: Two times a day (BID) | ORAL | Status: DC

## 2013-12-30 MED ORDER — ALIGN 4 MG PO CAPS: 1.0000 | ORAL_CAPSULE | Freq: Every day | ORAL | Status: DC

## 2013-12-30 NOTE — Patient Instructions (Addendum)
Preventive Care for Adults A healthy lifestyle and preventive care can promote health and wellness. Preventive health guidelines for women include the following key practices.  A routine yearly physical is a good way to check with your health care provider about your health and preventive screening. It is a chance to share any concerns and updates on your health and to receive a thorough exam.  Visit your dentist for a routine exam and preventive care every 6 months. Brush your teeth twice a day and floss once a day. Good oral hygiene prevents tooth decay and gum disease.  The frequency of eye exams is based on your age, health, family medical history, use of contact lenses, and other factors. Follow your health care provider's recommendations for frequency of eye exams.  Eat a healthy diet. Foods like vegetables, fruits, whole grains, low-fat dairy products, and lean protein foods contain the nutrients you need without too many calories. Decrease your intake of foods high in solid fats, added sugars, and salt. Eat the right amount of calories for you.Get information about a proper diet from your health care provider, if necessary.  Regular physical exercise is one of the most important things you can do for your health. Most adults should get at least 150 minutes of moderate-intensity exercise (any activity that increases your heart rate and causes you to sweat) each week. In addition, most adults need muscle-strengthening exercises on 2 or more days a week.  Maintain a healthy weight. The body mass index (BMI) is a screening tool to identify possible weight problems. It provides an estimate of body fat based on height and weight. Your health care provider can find your BMI and can help you achieve or maintain a healthy weight.For adults 20 years and older:  A BMI below 18.5 is considered underweight.  A BMI of 18.5 to 24.9 is normal.  A BMI of 25 to 29.9 is considered overweight.  A BMI of  30 and above is considered obese.  Maintain normal blood lipids and cholesterol levels by exercising and minimizing your intake of saturated fat. Eat a balanced diet with plenty of fruit and vegetables. Blood tests for lipids and cholesterol should begin at age 76 and be repeated every 5 years. If your lipid or cholesterol levels are high, you are over 50, or you are at high risk for heart disease, you may need your cholesterol levels checked more frequently.Ongoing high lipid and cholesterol levels should be treated with medicines if diet and exercise are not working.  If you smoke, find out from your health care provider how to quit. If you do not use tobacco, do not start.  Lung cancer screening is recommended for adults aged 22-80 years who are at high risk for developing lung cancer because of a history of smoking. A yearly low-dose CT scan of the lungs is recommended for people who have at least a 30-pack-year history of smoking and are a current smoker or have quit within the past 15 years. A pack year of smoking is smoking an average of 1 pack of cigarettes a day for 1 year (for example: 1 pack a day for 30 years or 2 packs a day for 15 years). Yearly screening should continue until the smoker has stopped smoking for at least 15 years. Yearly screening should be stopped for people who develop a health problem that would prevent them from having lung cancer treatment.  If you are pregnant, do not drink alcohol. If you are breastfeeding,  be very cautious about drinking alcohol. If you are not pregnant and choose to drink alcohol, do not have more than 1 drink per day. One drink is considered to be 12 ounces (355 mL) of beer, 5 ounces (148 mL) of wine, or 1.5 ounces (44 mL) of liquor.  Avoid use of street drugs. Do not share needles with anyone. Ask for help if you need support or instructions about stopping the use of drugs.  High blood pressure causes heart disease and increases the risk of  stroke. Your blood pressure should be checked at least every 1 to 2 years. Ongoing high blood pressure should be treated with medicines if weight loss and exercise do not work.  If you are 3-86 years old, ask your health care provider if you should take aspirin to prevent strokes.  Diabetes screening involves taking a blood sample to check your fasting blood sugar level. This should be done once every 3 years, after age 67, if you are within normal weight and without risk factors for diabetes. Testing should be considered at a younger age or be carried out more frequently if you are overweight and have at least 1 risk factor for diabetes.  Breast cancer screening is essential preventive care for women. You should practice "breast self-awareness." This means understanding the normal appearance and feel of your breasts and may include breast self-examination. Any changes detected, no matter how small, should be reported to a health care provider. Women in their 8s and 30s should have a clinical breast exam (CBE) by a health care provider as part of a regular health exam every 1 to 3 years. After age 70, women should have a CBE every year. Starting at age 25, women should consider having a mammogram (breast X-ray test) every year. Women who have a family history of breast cancer should talk to their health care provider about genetic screening. Women at a high risk of breast cancer should talk to their health care providers about having an MRI and a mammogram every year.  Breast cancer gene (BRCA)-related cancer risk assessment is recommended for women who have family members with BRCA-related cancers. BRCA-related cancers include breast, ovarian, tubal, and peritoneal cancers. Having family members with these cancers may be associated with an increased risk for harmful changes (mutations) in the breast cancer genes BRCA1 and BRCA2. Results of the assessment will determine the need for genetic counseling and  BRCA1 and BRCA2 testing.  Routine pelvic exams to screen for cancer are no longer recommended for nonpregnant women who are considered low risk for cancer of the pelvic organs (ovaries, uterus, and vagina) and who do not have symptoms. Ask your health care provider if a screening pelvic exam is right for you.  If you have had past treatment for cervical cancer or a condition that could lead to cancer, you need Pap tests and screening for cancer for at least 20 years after your treatment. If Pap tests have been discontinued, your risk factors (such as having a new sexual partner) need to be reassessed to determine if screening should be resumed. Some women have medical problems that increase the chance of getting cervical cancer. In these cases, your health care provider may recommend more frequent screening and Pap tests.  The HPV test is an additional test that may be used for cervical cancer screening. The HPV test looks for the virus that can cause the cell changes on the cervix. The cells collected during the Pap test can be  tested for HPV. The HPV test could be used to screen women aged 30 years and older, and should be used in women of any age who have unclear Pap test results. After the age of 30, women should have HPV testing at the same frequency as a Pap test.  Colorectal cancer can be detected and often prevented. Most routine colorectal cancer screening begins at the age of 50 years and continues through age 75 years. However, your health care provider may recommend screening at an earlier age if you have risk factors for colon cancer. On a yearly basis, your health care provider may provide home test kits to check for hidden blood in the stool. Use of a small camera at the end of a tube, to directly examine the colon (sigmoidoscopy or colonoscopy), can detect the earliest forms of colorectal cancer. Talk to your health care provider about this at age 50, when routine screening begins. Direct  exam of the colon should be repeated every 5-10 years through age 75 years, unless early forms of pre-cancerous polyps or small growths are found.  People who are at an increased risk for hepatitis B should be screened for this virus. You are considered at high risk for hepatitis B if:  You were born in a country where hepatitis B occurs often. Talk with your health care provider about which countries are considered high risk.  Your parents were born in a high-risk country and you have not received a shot to protect against hepatitis B (hepatitis B vaccine).  You have HIV or AIDS.  You use needles to inject street drugs.  You live with, or have sex with, someone who has hepatitis B.  You get hemodialysis treatment.  You take certain medicines for conditions like cancer, organ transplantation, and autoimmune conditions.  Hepatitis C blood testing is recommended for all people born from 1945 through 1965 and any individual with known risks for hepatitis C.  Practice safe sex. Use condoms and avoid high-risk sexual practices to reduce the spread of sexually transmitted infections (STIs). STIs include gonorrhea, chlamydia, syphilis, trichomonas, herpes, HPV, and human immunodeficiency virus (HIV). Herpes, HIV, and HPV are viral illnesses that have no cure. They can result in disability, cancer, and death.  You should be screened for sexually transmitted illnesses (STIs) including gonorrhea and chlamydia if:  You are sexually active and are younger than 24 years.  You are older than 24 years and your health care provider tells you that you are at risk for this type of infection.  Your sexual activity has changed since you were last screened and you are at an increased risk for chlamydia or gonorrhea. Ask your health care provider if you are at risk.  If you are at risk of being infected with HIV, it is recommended that you take a prescription medicine daily to prevent HIV infection. This is  called preexposure prophylaxis (PrEP). You are considered at risk if:  You are a heterosexual woman, are sexually active, and are at increased risk for HIV infection.  You take drugs by injection.  You are sexually active with a partner who has HIV.  Talk with your health care provider about whether you are at high risk of being infected with HIV. If you choose to begin PrEP, you should first be tested for HIV. You should then be tested every 3 months for as long as you are taking PrEP.  Osteoporosis is a disease in which the bones lose minerals and strength   with aging. This can result in serious bone fractures or breaks. The risk of osteoporosis can be identified using a bone density scan. Women ages 65 years and over and women at risk for fractures or osteoporosis should discuss screening with their health care providers. Ask your health care provider whether you should take a calcium supplement or vitamin D to reduce the rate of osteoporosis.  Menopause can be associated with physical symptoms and risks. Hormone replacement therapy is available to decrease symptoms and risks. You should talk to your health care provider about whether hormone replacement therapy is right for you.  Use sunscreen. Apply sunscreen liberally and repeatedly throughout the day. You should seek shade when your shadow is shorter than you. Protect yourself by wearing long sleeves, pants, a wide-brimmed hat, and sunglasses year round, whenever you are outdoors.  Once a month, do a whole body skin exam, using a mirror to look at the skin on your back. Tell your health care provider of new moles, moles that have irregular borders, moles that are larger than a pencil eraser, or moles that have changed in shape or color.  Stay current with required vaccines (immunizations).  Influenza vaccine. All adults should be immunized every year.  Tetanus, diphtheria, and acellular pertussis (Td, Tdap) vaccine. Pregnant women should  receive 1 dose of Tdap vaccine during each pregnancy. The dose should be obtained regardless of the length of time since the last dose. Immunization is preferred during the 27th-36th week of gestation. An adult who has not previously received Tdap or who does not know her vaccine status should receive 1 dose of Tdap. This initial dose should be followed by tetanus and diphtheria toxoids (Td) booster doses every 10 years. Adults with an unknown or incomplete history of completing a 3-dose immunization series with Td-containing vaccines should begin or complete a primary immunization series including a Tdap dose. Adults should receive a Td booster every 10 years.  Varicella vaccine. An adult without evidence of immunity to varicella should receive 2 doses or a second dose if she has previously received 1 dose. Pregnant females who do not have evidence of immunity should receive the first dose after pregnancy. This first dose should be obtained before leaving the health care facility. The second dose should be obtained 4-8 weeks after the first dose.  Human papillomavirus (HPV) vaccine. Females aged 13-26 years who have not received the vaccine previously should obtain the 3-dose series. The vaccine is not recommended for use in pregnant females. However, pregnancy testing is not needed before receiving a dose. If a female is found to be pregnant after receiving a dose, no treatment is needed. In that case, the remaining doses should be delayed until after the pregnancy. Immunization is recommended for any person with an immunocompromised condition through the age of 26 years if she did not get any or all doses earlier. During the 3-dose series, the second dose should be obtained 4-8 weeks after the first dose. The third dose should be obtained 24 weeks after the first dose and 16 weeks after the second dose.  Zoster vaccine. One dose is recommended for adults aged 60 years or older unless certain conditions are  present.  Measles, mumps, and rubella (MMR) vaccine. Adults born before 1957 generally are considered immune to measles and mumps. Adults born in 1957 or later should have 1 or more doses of MMR vaccine unless there is a contraindication to the vaccine or there is laboratory evidence of immunity to   each of the three diseases. A routine second dose of MMR vaccine should be obtained at least 28 days after the first dose for students attending postsecondary schools, health care workers, or international travelers. People who received inactivated measles vaccine or an unknown type of measles vaccine during 1963-1967 should receive 2 doses of MMR vaccine. People who received inactivated mumps vaccine or an unknown type of mumps vaccine before 1979 and are at high risk for mumps infection should consider immunization with 2 doses of MMR vaccine. For females of childbearing age, rubella immunity should be determined. If there is no evidence of immunity, females who are not pregnant should be vaccinated. If there is no evidence of immunity, females who are pregnant should delay immunization until after pregnancy. Unvaccinated health care workers born before 1957 who lack laboratory evidence of measles, mumps, or rubella immunity or laboratory confirmation of disease should consider measles and mumps immunization with 2 doses of MMR vaccine or rubella immunization with 1 dose of MMR vaccine.  Pneumococcal 13-valent conjugate (PCV13) vaccine. When indicated, a person who is uncertain of her immunization history and has no record of immunization should receive the PCV13 vaccine. An adult aged 19 years or older who has certain medical conditions and has not been previously immunized should receive 1 dose of PCV13 vaccine. This PCV13 should be followed with a dose of pneumococcal polysaccharide (PPSV23) vaccine. The PPSV23 vaccine dose should be obtained at least 8 weeks after the dose of PCV13 vaccine. An adult aged 19  years or older who has certain medical conditions and previously received 1 or more doses of PPSV23 vaccine should receive 1 dose of PCV13. The PCV13 vaccine dose should be obtained 1 or more years after the last PPSV23 vaccine dose.  Pneumococcal polysaccharide (PPSV23) vaccine. When PCV13 is also indicated, PCV13 should be obtained first. All adults aged 65 years and older should be immunized. An adult younger than age 65 years who has certain medical conditions should be immunized. Any person who resides in a nursing home or long-term care facility should be immunized. An adult smoker should be immunized. People with an immunocompromised condition and certain other conditions should receive both PCV13 and PPSV23 vaccines. People with human immunodeficiency virus (HIV) infection should be immunized as soon as possible after diagnosis. Immunization during chemotherapy or radiation therapy should be avoided. Routine use of PPSV23 vaccine is not recommended for American Indians, [place] Natives, or people younger than 65 years unless there are medical conditions that require PPSV23 vaccine. When indicated, people who have unknown immunization and have no record of immunization should receive PPSV23 vaccine. One-time revaccination 5 years after the first dose of PPSV23 is recommended for people aged 19-64 years who have chronic kidney failure, nephrotic syndrome, asplenia, or immunocompromised conditions. People who received 1-2 doses of PPSV23 before age 65 years should receive another dose of PPSV23 vaccine at age 65 years or later if at least 5 years have passed since the previous dose. Doses of PPSV23 are not needed for people immunized with PPSV23 at or after age 65 years.  Meningococcal vaccine. Adults with asplenia or persistent complement component deficiencies should receive 2 doses of quadrivalent meningococcal conjugate (MenACWY-D) vaccine. The doses should be obtained at least 2 months apart.  Microbiologists working with certain meningococcal bacteria, military recruits, people at risk during an outbreak, and people who travel to or live in countries with a high rate of meningitis should be immunized. A first-year college student up through age   21 years who is living in a residence hall should receive a dose if she did not receive a dose on or after her 16th birthday. Adults who have certain high-risk conditions should receive one or more doses of vaccine.  Hepatitis A vaccine. Adults who wish to be protected from this disease, have certain high-risk conditions, work with hepatitis A-infected animals, work in hepatitis A research labs, or travel to or work in countries with a high rate of hepatitis A should be immunized. Adults who were previously unvaccinated and who anticipate close contact with an international adoptee during the first 60 days after arrival in the [place] from a country with a high rate of hepatitis A should be immunized.  Hepatitis B vaccine. Adults who wish to be protected from this disease, have certain high-risk conditions, may be exposed to blood or other infectious body fluids, are household contacts or sex partners of hepatitis B positive people, are clients or workers in certain care facilities, or travel to or work in countries with a high rate of hepatitis B should be immunized.  Haemophilus influenzae type b (Hib) vaccine. A previously unvaccinated person with asplenia or sickle cell disease or having a scheduled splenectomy should receive 1 dose of Hib vaccine. Regardless of previous immunization, a recipient of a hematopoietic stem cell transplant should receive a 3-dose series 6-12 months after her successful transplant. Hib vaccine is not recommended for adults with HIV infection. Preventive Services / Frequency Ages 64 to 68 years  Blood pressure check.** / Every 1 to 2 years.  Lipid and cholesterol check.** / Every 5 years beginning at age  22.  Clinical breast exam.** / Every 3 years for women in their 88s and 53s.  BRCA-related cancer risk assessment.** / For women who have family members with a BRCA-related cancer (breast, ovarian, tubal, or peritoneal cancers).  Pap test.** / Every 2 years from ages 90 through 51. Every 3 years starting at age 21 through age 56 or 3 with a history of 3 consecutive normal Pap tests.  HPV screening.** / Every 3 years from ages 24 through ages 1 to 46 with a history of 3 consecutive normal Pap tests.  Hepatitis C blood test.** / For any individual with known risks for hepatitis C.  Skin self-exam. / Monthly.  Influenza vaccine. / Every year.  Tetanus, diphtheria, and acellular pertussis (Tdap, Td) vaccine.** / Consult your health care provider. Pregnant women should receive 1 dose of Tdap vaccine during each pregnancy. 1 dose of Td every 10 years.  Varicella vaccine.** / Consult your health care provider. Pregnant females who do not have evidence of immunity should receive the first dose after pregnancy.  HPV vaccine. / 3 doses over 6 months, if 72 and younger. The vaccine is not recommended for use in pregnant females. However, pregnancy testing is not needed before receiving a dose.  Measles, mumps, rubella (MMR) vaccine.** / You need at least 1 dose of MMR if you were born in 1957 or later. You may also need a 2nd dose. For females of childbearing age, rubella immunity should be determined. If there is no evidence of immunity, females who are not pregnant should be vaccinated. If there is no evidence of immunity, females who are pregnant should delay immunization until after pregnancy.  Pneumococcal 13-valent conjugate (PCV13) vaccine.** / Consult your health care provider.  Pneumococcal polysaccharide (PPSV23) vaccine.** / 1 to 2 doses if you smoke cigarettes or if you have certain conditions.  Meningococcal vaccine.** /  1 dose if you are age 19 to 21 years and a first-year college  student living in a residence hall, or have one of several medical conditions, you need to get vaccinated against meningococcal disease. You may also need additional booster doses.  Hepatitis A vaccine.** / Consult your health care provider.  Hepatitis B vaccine.** / Consult your health care provider.  Haemophilus influenzae type b (Hib) vaccine.** / Consult your health care provider. Ages 40 to 64 years  Blood pressure check.** / Every 1 to 2 years.  Lipid and cholesterol check.** / Every 5 years beginning at age 20 years.  Lung cancer screening. / Every year if you are aged 55-80 years and have a 30-pack-year history of smoking and currently smoke or have quit within the past 15 years. Yearly screening is stopped once you have quit smoking for at least 15 years or develop a health problem that would prevent you from having lung cancer treatment.  Clinical breast exam.** / Every year after age 40 years.  BRCA-related cancer risk assessment.** / For women who have family members with a BRCA-related cancer (breast, ovarian, tubal, or peritoneal cancers).  Mammogram.** / Every year beginning at age 40 years and continuing for as long as you are in good health. Consult with your health care provider.  Pap test.** / Every 3 years starting at age 30 years through age 65 or 70 years with a history of 3 consecutive normal Pap tests.  HPV screening.** / Every 3 years from ages 30 years through ages 65 to 70 years with a history of 3 consecutive normal Pap tests.  Fecal occult blood test (FOBT) of stool. / Every year beginning at age 50 years and continuing until age 75 years. You may not need to do this test if you get a colonoscopy every 10 years.  Flexible sigmoidoscopy or colonoscopy.** / Every 5 years for a flexible sigmoidoscopy or every 10 years for a colonoscopy beginning at age 50 years and continuing until age 75 years.  Hepatitis C blood test.** / For all people born from 1945 through  1965 and any individual with known risks for hepatitis C.  Skin self-exam. / Monthly.  Influenza vaccine. / Every year.  Tetanus, diphtheria, and acellular pertussis (Tdap/Td) vaccine.** / Consult your health care provider. Pregnant women should receive 1 dose of Tdap vaccine during each pregnancy. 1 dose of Td every 10 years.  Varicella vaccine.** / Consult your health care provider. Pregnant females who do not have evidence of immunity should receive the first dose after pregnancy.  Zoster vaccine.** / 1 dose for adults aged 60 years or older.  Measles, mumps, rubella (MMR) vaccine.** / You need at least 1 dose of MMR if you were born in 1957 or later. You may also need a 2nd dose. For females of childbearing age, rubella immunity should be determined. If there is no evidence of immunity, females who are not pregnant should be vaccinated. If there is no evidence of immunity, females who are pregnant should delay immunization until after pregnancy.  Pneumococcal 13-valent conjugate (PCV13) vaccine.** / Consult your health care provider.  Pneumococcal polysaccharide (PPSV23) vaccine.** / 1 to 2 doses if you smoke cigarettes or if you have certain conditions.  Meningococcal vaccine.** / Consult your health care provider.  Hepatitis A vaccine.** / Consult your health care provider.  Hepatitis B vaccine.** / Consult your health care provider.  Haemophilus influenzae type b (Hib) vaccine.** / Consult your health care provider. Ages 65   years and over  Blood pressure check.** / Every 1 to 2 years.  Lipid and cholesterol check.** / Every 5 years beginning at age 22 years.  Lung cancer screening. / Every year if you are aged 73-80 years and have a 30-pack-year history of smoking and currently smoke or have quit within the past 15 years. Yearly screening is stopped once you have quit smoking for at least 15 years or develop a health problem that would prevent you from having lung cancer  treatment.  Clinical breast exam.** / Every year after age 4 years.  BRCA-related cancer risk assessment.** / For women who have family members with a BRCA-related cancer (breast, ovarian, tubal, or peritoneal cancers).  Mammogram.** / Every year beginning at age 40 years and continuing for as long as you are in good health. Consult with your health care provider.  Pap test.** / Every 3 years starting at age 9 years through age 34 or 91 years with 3 consecutive normal Pap tests. Testing can be stopped between 65 and 70 years with 3 consecutive normal Pap tests and no abnormal Pap or HPV tests in the past 10 years.  HPV screening.** / Every 3 years from ages 57 years through ages 64 or 45 years with a history of 3 consecutive normal Pap tests. Testing can be stopped between 65 and 70 years with 3 consecutive normal Pap tests and no abnormal Pap or HPV tests in the past 10 years.  Fecal occult blood test (FOBT) of stool. / Every year beginning at age 15 years and continuing until age 17 years. You may not need to do this test if you get a colonoscopy every 10 years.  Flexible sigmoidoscopy or colonoscopy.** / Every 5 years for a flexible sigmoidoscopy or every 10 years for a colonoscopy beginning at age 86 years and continuing until age 71 years.  Hepatitis C blood test.** / For all people born from [birth year] through 1967 and any individual with known risks for hepatitis C.  Osteoporosis screening.** / A one-time screening for women ages 83 years and over and women at risk for fractures or osteoporosis.  Skin self-exam. / Monthly.  Influenza vaccine. / Every year.  Tetanus, diphtheria, and acellular pertussis (Tdap/Td) vaccine.** / 1 dose of Td every 10 years.  Varicella vaccine.** / Consult your health care provider.  Zoster vaccine.** / 1 dose for adults aged 61 years or older.  Pneumococcal 13-valent conjugate (PCV13) vaccine.** / Consult your health care provider.  Pneumococcal  polysaccharide (PPSV23) vaccine.** / 1 dose for all adults aged 28 years and older.  Meningococcal vaccine.** / Consult your health care provider.  Hepatitis A vaccine.** / Consult your health care provider.  Hepatitis B vaccine.** / Consult your health care provider.  Haemophilus influenzae type b (Hib) vaccine.** / Consult your health care provider. ** Family history and personal history of risk and conditions may change your health care provider's recommendations. Document Released: 06/20/2001 Document Revised: 09/08/2013 Document Reviewed: 09/19/2010 [facility] Patient Information 2015 [place], [place]. This information is not intended to replace advice given to you by your health care provider. Make sure you discuss any questions you have with your health care provider.

## 2013-12-30 NOTE — Assessment & Plan Note (Signed)
Resolved

## 2013-12-30 NOTE — Assessment & Plan Note (Signed)
Much better s/p I&D Align po

## 2013-12-30 NOTE — Assessment & Plan Note (Signed)
Doing fair 

## 2013-12-30 NOTE — Progress Notes (Signed)
Pre visit review using our clinic review tool, if applicable. No additional management support is needed unless otherwise documented below in the visit note. 

## 2013-12-30 NOTE — Progress Notes (Signed)
   Subjective:   HPI  The patient is here for a wellness exam.  Former Dr Arnoldo Morale patient  F/u R cheek abscess that formed first on 12/13/13 - s/p I&D - better. On Doxy po  The patient needs to address  chronic hypertension that has been well controlled with medicines; to address chronic  hyperlipidemia controlled with medicines as well, HA  BP Readings from Last 3 Encounters:  12/30/13 140/82  12/23/13 130/80  06/02/13 120/70   Wt Readings from Last 3 Encounters:  12/30/13 167 lb (75.751 kg)  12/23/13 170 lb (77.111 kg)  06/02/13 155 lb (70.308 kg)      Review of Systems  Constitutional: Negative for chills, activity change, appetite change, fatigue and unexpected weight change.  HENT: Negative for congestion, mouth sores and sinus pressure.   Eyes: Negative for visual disturbance.  Respiratory: Negative for cough and chest tightness.   Gastrointestinal: Negative for nausea and abdominal pain.  Genitourinary: Negative for frequency, difficulty urinating and vaginal pain.  Musculoskeletal: Negative for back pain and gait problem.  Skin: Negative for pallor and rash.  Neurological: Negative for dizziness, tremors, weakness, numbness and headaches.  Psychiatric/Behavioral: Negative for confusion and sleep disturbance.       Objective:   Physical Exam  Constitutional: She appears well-developed. No distress.  HENT:  Head: Normocephalic.  Right Ear: External ear normal.  Left Ear: External ear normal.  Nose: Nose normal.  Mouth/Throat: Oropharynx is clear and moist.  Eyes: Conjunctivae are normal. Pupils are equal, round, and reactive to light. Right eye exhibits no discharge. Left eye exhibits no discharge.  Neck: Normal range of motion. Neck supple. No JVD present. No tracheal deviation present. No thyromegaly present.  Cardiovascular: Normal rate, regular rhythm and normal heart sounds.   Pulmonary/Chest: No stridor. No respiratory distress. She has no wheezes.    Abdominal: Soft. Bowel sounds are normal. She exhibits no distension and no mass. There is no tenderness. There is no rebound and no guarding.  Musculoskeletal: She exhibits no edema and no tenderness.  Lymphadenopathy:    She has no cervical adenopathy.  Neurological: She displays normal reflexes. No cranial nerve deficit. She exhibits normal muscle tone. Coordination normal.  Skin: No rash noted. There is erythema. No pallor.  R cheek abscess is much better:3x1.5 cm infiltrate is palpable  Psychiatric: She has a normal mood and affect. Her behavior is normal. Judgment and thought content normal.     Lab Results  Component Value Date   WBC 7.9 12/25/2013   HGB 13.4 12/25/2013   HCT 39.3 12/25/2013   PLT 347.0 12/25/2013   GLUCOSE 90 12/25/2013   CHOL 147 12/25/2013   TRIG 152.0* 12/25/2013   HDL 47.10 12/25/2013   LDLDIRECT 145.3 01/27/2013   LDLCALC 70 12/25/2013   ALT 26 12/25/2013   AST 17 12/25/2013   NA 137 12/25/2013   K 3.7 12/25/2013   CL 102 12/25/2013   CREATININE 1.0 12/25/2013   BUN 25* 12/25/2013   CO2 29 12/25/2013   TSH 1.54 12/25/2013          Assessment & Plan:

## 2014-01-01 ENCOUNTER — Encounter: Payer: Self-pay | Admitting: Internal Medicine

## 2014-01-06 DIAGNOSIS — M5137 Other intervertebral disc degeneration, lumbosacral region: Secondary | ICD-10-CM | POA: Diagnosis not present

## 2014-01-06 DIAGNOSIS — L03211 Cellulitis of face: Secondary | ICD-10-CM | POA: Diagnosis not present

## 2014-01-06 DIAGNOSIS — L0201 Cutaneous abscess of face: Secondary | ICD-10-CM | POA: Diagnosis not present

## 2014-01-06 DIAGNOSIS — Z5189 Encounter for other specified aftercare: Secondary | ICD-10-CM | POA: Diagnosis not present

## 2014-01-06 DIAGNOSIS — M5030 Other cervical disc degeneration, unspecified cervical region: Secondary | ICD-10-CM | POA: Diagnosis not present

## 2014-01-06 DIAGNOSIS — G894 Chronic pain syndrome: Secondary | ICD-10-CM | POA: Diagnosis not present

## 2014-02-10 DIAGNOSIS — L0201 Cutaneous abscess of face: Secondary | ICD-10-CM | POA: Diagnosis not present

## 2014-02-13 ENCOUNTER — Telehealth: Payer: Self-pay | Admitting: Internal Medicine

## 2014-02-13 ENCOUNTER — Ambulatory Visit: Payer: Medicare Other | Admitting: Internal Medicine

## 2014-02-13 NOTE — Telephone Encounter (Signed)
Patient Information:  Caller Name: Ed  Phone: [phone]  Patient: [facility], Savannha  Gender: Female  DOB: 19-Oct-1950  Age: 63 Years  PCP: Plotnikov, Alex (Adults only)  Office Follow Up:  Does the office need to follow up with this patient?: No  Instructions For The Office: N/A  RN Note:  Pt changes her story and states that she really has not been vomiting that much; it is 6-8 times but very small amounts and it is only clear water because as soon as she vomits she drinks water;  states that she can hold fluids down now; just urinated while on the phone and good stream per pt; states that she doesn't want to go to ED but wanted otsee what the office has available; office called and 4:45pm appt made with Dr Linna Darner; will try to make it if husband is home; explained that we really wanted here seen in ED so it is important to come to the office for appt;  try to call family or friend; if sx worsen will need to call 911 or go to ED; voices understanding  Symptoms  Reason For Call & Symptoms: Pt is calling and states that she can not eat anything without vomiting; even crackers make her vomit; feels weak;  vomited 6-8 times since midnight; last time vomited 4  1/2 hours ago; last time urinated 1 hour ago; trying to drink fluids; she also has a headache and cough;  Reviewed Health History In EMR: Yes  Reviewed Medications In EMR: Yes  Reviewed Allergies In EMR: Yes  Reviewed Surgeries / Procedures: Yes  Date of Onset of Symptoms: 02/11/2014  Treatments Tried: sipping on fluids  Treatments Tried Worked: No  Guideline(s) Used:  Vomiting  Disposition Per Guideline:   Go to ED Now  Reason For Disposition Reached:   Severe vomiting (e.g., 6 or more times/day)  Advice Given:  Call Back If:  You become worse.  Clear Liquids:  Sip water or a rehydration drink (e.g., Gatorade or Powerade).  Patient Refused Recommendation:  Patient Will Follow Up With Office Later  pt requesting appt  in the office today or tomorrow

## 2014-02-17 ENCOUNTER — Inpatient Hospital Stay (HOSPITAL_COMMUNITY)
Admission: EM | Admit: 2014-02-17 | Discharge: 2014-02-19 | DRG: 690 | Disposition: A | Discharge status: Home / Self Care | Payer: Medicare Other | Attending: Internal Medicine | Admitting: Internal Medicine

## 2014-02-17 ENCOUNTER — Other Ambulatory Visit (INDEPENDENT_AMBULATORY_CARE_PROVIDER_SITE_OTHER): Payer: Medicare Other

## 2014-02-17 ENCOUNTER — Ambulatory Visit (INDEPENDENT_AMBULATORY_CARE_PROVIDER_SITE_OTHER): Payer: Medicare Other | Admitting: Internal Medicine

## 2014-02-17 ENCOUNTER — Encounter: Payer: Self-pay | Admitting: Internal Medicine

## 2014-02-17 ENCOUNTER — Encounter (HOSPITAL_COMMUNITY): Payer: Self-pay | Admitting: Emergency Medicine

## 2014-02-17 VITALS — BP 128/78 | HR 80 | Temp 98.1°F | Resp 16 | Wt 159.0 lb

## 2014-02-17 DIAGNOSIS — R1110 Vomiting, unspecified: Secondary | ICD-10-CM

## 2014-02-17 DIAGNOSIS — N390 Urinary tract infection, site not specified: Secondary | ICD-10-CM | POA: Insufficient documentation

## 2014-02-17 DIAGNOSIS — G4700 Insomnia, unspecified: Secondary | ICD-10-CM | POA: Diagnosis present

## 2014-02-17 DIAGNOSIS — K2970 Gastritis, unspecified, without bleeding: Secondary | ICD-10-CM | POA: Diagnosis not present

## 2014-02-17 DIAGNOSIS — K529 Noninfective gastroenteritis and colitis, unspecified: Secondary | ICD-10-CM

## 2014-02-17 DIAGNOSIS — M1990 Unspecified osteoarthritis, unspecified site: Secondary | ICD-10-CM | POA: Diagnosis present

## 2014-02-17 DIAGNOSIS — B9689 Other specified bacterial agents as the cause of diseases classified elsewhere: Secondary | ICD-10-CM | POA: Diagnosis not present

## 2014-02-17 DIAGNOSIS — I10 Essential (primary) hypertension: Secondary | ICD-10-CM | POA: Diagnosis present

## 2014-02-17 DIAGNOSIS — E876 Hypokalemia: Secondary | ICD-10-CM | POA: Diagnosis present

## 2014-02-17 DIAGNOSIS — K219 Gastro-esophageal reflux disease without esophagitis: Secondary | ICD-10-CM | POA: Diagnosis present

## 2014-02-17 DIAGNOSIS — J45909 Unspecified asthma, uncomplicated: Secondary | ICD-10-CM | POA: Diagnosis present

## 2014-02-17 DIAGNOSIS — F419 Anxiety disorder, unspecified: Secondary | ICD-10-CM | POA: Diagnosis present

## 2014-02-17 DIAGNOSIS — I4581 Long QT syndrome: Secondary | ICD-10-CM | POA: Diagnosis not present

## 2014-02-17 DIAGNOSIS — K7689 Other specified diseases of liver: Secondary | ICD-10-CM | POA: Diagnosis not present

## 2014-02-17 DIAGNOSIS — E860 Dehydration: Secondary | ICD-10-CM

## 2014-02-17 DIAGNOSIS — R319 Hematuria, unspecified: Secondary | ICD-10-CM

## 2014-02-17 DIAGNOSIS — N179 Acute kidney failure, unspecified: Secondary | ICD-10-CM | POA: Diagnosis present

## 2014-02-17 DIAGNOSIS — R112 Nausea with vomiting, unspecified: Secondary | ICD-10-CM | POA: Diagnosis not present

## 2014-02-17 DIAGNOSIS — B9620 Unspecified Escherichia coli [E. coli] as the cause of diseases classified elsewhere: Secondary | ICD-10-CM | POA: Diagnosis present

## 2014-02-17 DIAGNOSIS — Z79899 Other long term (current) drug therapy: Secondary | ICD-10-CM

## 2014-02-17 LAB — CBC WITH DIFFERENTIAL/PLATELET
BASOS PCT: 0.3 % (ref 0.0–3.0)
BASOS PCT: 0 % (ref 0–1)
Basophils Absolute: 0 10*3/uL (ref 0.0–0.1)
Basophils Absolute: 0 10*3/uL (ref 0.0–0.1)
EOS PCT: 0 % (ref 0–5)
Eosinophils Absolute: 0 10*3/uL (ref 0.0–0.7)
Eosinophils Absolute: 0 10*3/uL (ref 0.0–0.7)
Eosinophils Relative: 0.2 % (ref 0.0–5.0)
HCT: 42.2 % (ref 36.0–46.0)
HEMATOCRIT: 39.8 % (ref 36.0–46.0)
HEMOGLOBIN: 14.1 g/dL (ref 12.0–15.0)
Hemoglobin: 14.6 g/dL (ref 12.0–15.0)
LYMPHS ABS: 1.8 10*3/uL (ref 0.7–4.0)
Lymphocytes Relative: 10.5 % — ABNORMAL LOW (ref 12.0–46.0)
Lymphocytes Relative: 14 % (ref 12–46)
Lymphs Abs: 1.5 10*3/uL (ref 0.7–4.0)
MCH: 30.4 pg (ref 26.0–34.0)
MCHC: 33.5 g/dL (ref 30.0–36.0)
MCHC: 36.7 g/dL — ABNORMAL HIGH (ref 30.0–36.0)
MCV: 89.2 fl (ref 78.0–100.0)
MCV: 82.7 fL (ref 78.0–100.0)
MONO ABS: 1.4 10*3/uL — AB (ref 0.1–1.0)
MONO ABS: 1.4 10*3/uL — AB (ref 0.1–1.0)
MONOS PCT: 9.8 % (ref 3.0–12.0)
MONOS PCT: 11 % (ref 3–12)
NEUTROS ABS: 9.3 10*3/uL — AB (ref 1.7–7.7)
Neutro Abs: 11.1 10*3/uL — ABNORMAL HIGH (ref 1.4–7.7)
Neutrophils Relative %: 79.2 % — ABNORMAL HIGH (ref 43.0–77.0)
Neutrophils Relative %: 75 % (ref 43–77)
PLATELETS: 281 10*3/uL (ref 150–400)
Platelets: 288 10*3/uL (ref 150.0–400.0)
RBC: 4.73 Mil/uL (ref 3.87–5.11)
RBC: 4.81 MIL/uL (ref 3.87–5.11)
RDW: 13.3 % (ref 11.5–15.5)
RDW: 12.5 % (ref 11.5–15.5)
WBC: 14 10*3/uL — ABNORMAL HIGH (ref 4.0–10.5)
WBC: 12.5 10*3/uL — ABNORMAL HIGH (ref 4.0–10.5)

## 2014-02-17 LAB — COMPREHENSIVE METABOLIC PANEL
ALK PHOS: 133 U/L — AB (ref 39–117)
ALT: 61 U/L — ABNORMAL HIGH (ref 0–35)
ALT: 53 U/L — ABNORMAL HIGH (ref 0–35)
ANION GAP: 18 — AB (ref 5–15)
AST: 41 U/L — ABNORMAL HIGH (ref 0–37)
AST: 40 U/L — AB (ref 0–37)
Albumin: 3.3 g/dL — ABNORMAL LOW (ref 3.5–5.2)
Albumin: 2.9 g/dL — ABNORMAL LOW (ref 3.5–5.2)
Alkaline Phosphatase: 145 U/L — ABNORMAL HIGH (ref 39–117)
Anion gap: 20 — ABNORMAL HIGH (ref 5–15)
BILIRUBIN TOTAL: 0.7 mg/dL (ref 0.3–1.2)
BUN: 40 mg/dL — AB (ref 6–23)
BUN: 39 mg/dL — ABNORMAL HIGH (ref 6–23)
CHLORIDE: 90 meq/L — AB (ref 96–112)
CO2: 28 meq/L (ref 19–32)
CO2: 21 mEq/L (ref 19–32)
CREATININE: 1.4 mg/dL — AB (ref 0.50–1.10)
Calcium: 10.3 mg/dL (ref 8.4–10.5)
Calcium: 9.5 mg/dL (ref 8.4–10.5)
Chloride: 88 mEq/L — ABNORMAL LOW (ref 96–112)
Creatinine, Ser: 1.22 mg/dL — ABNORMAL HIGH (ref 0.50–1.10)
GFR calc Af Amer: 45 mL/min — ABNORMAL LOW (ref >90)
GFR, EST AFRICAN AMERICAN: 53 mL/min — AB (ref >90)
GFR, EST NON AFRICAN AMERICAN: 38 mL/min — AB (ref >90)
GFR, EST NON AFRICAN AMERICAN: 45 mL/min — AB (ref >90)
GLUCOSE: 138 mg/dL — AB (ref 70–99)
GLUCOSE: 84 mg/dL (ref 70–99)
POTASSIUM: 2.5 meq/L — AB (ref 3.7–5.3)
Potassium: 2.6 mEq/L — CL (ref 3.7–5.3)
SODIUM: 131 meq/L — AB (ref 137–147)
Sodium: 134 mEq/L — ABNORMAL LOW (ref 137–147)
Total Bilirubin: 0.9 mg/dL (ref 0.3–1.2)
Total Protein: 8 g/dL (ref 6.0–8.3)
Total Protein: 7.1 g/dL (ref 6.0–8.3)

## 2014-02-17 LAB — BASIC METABOLIC PANEL
BUN: 36 mg/dL — AB (ref 6–23)
CALCIUM: 9.8 mg/dL (ref 8.4–10.5)
CO2: 28 mEq/L (ref 19–32)
Chloride: 89 mEq/L — ABNORMAL LOW (ref 96–112)
Creatinine, Ser: 1.3 mg/dL — ABNORMAL HIGH (ref 0.4–1.2)
GFR: 42.85 mL/min — AB (ref >60.00)
Glucose, Bld: 99 mg/dL (ref 70–99)
Potassium: 2.4 mEq/L — CL (ref 3.5–5.1)
Sodium: 130 mEq/L — ABNORMAL LOW (ref 135–145)

## 2014-02-17 LAB — URINALYSIS, ROUTINE W REFLEX MICROSCOPIC
BILIRUBIN URINE: NEGATIVE
Bilirubin Urine: NEGATIVE
Glucose, UA: NEGATIVE mg/dL
KETONES UR: NEGATIVE
Ketones, ur: NEGATIVE mg/dL
Nitrite: NEGATIVE
Nitrite: POSITIVE — AB
PROTEIN: NEGATIVE mg/dL
SPECIFIC GRAVITY, URINE: 1.01 (ref 1.000–1.030)
Specific Gravity, Urine: 1.013 (ref 1.005–1.030)
URINE GLUCOSE: NEGATIVE
UROBILINOGEN UA: 1 (ref 0.0–1.0)
UROBILINOGEN UA: 1 mg/dL (ref 0.0–1.0)
pH: 6 (ref 5.0–8.0)
pH: 5.5 (ref 5.0–8.0)

## 2014-02-17 LAB — HEPATIC FUNCTION PANEL
ALBUMIN: 3 g/dL — AB (ref 3.5–5.2)
ALT: 58 U/L — ABNORMAL HIGH (ref 0–35)
AST: 47 U/L — AB (ref 0–37)
Alkaline Phosphatase: 133 U/L — ABNORMAL HIGH (ref 39–117)
Bilirubin, Direct: 0.3 mg/dL (ref 0.0–0.3)
Total Bilirubin: 1.1 mg/dL (ref 0.2–1.2)
Total Protein: 7.8 g/dL (ref 6.0–8.3)

## 2014-02-17 LAB — LIPASE: LIPASE: 61 U/L — AB (ref 11.0–59.0)

## 2014-02-17 LAB — SEDIMENTATION RATE: Sed Rate: 86 mm/hr — ABNORMAL HIGH (ref 0–22)

## 2014-02-17 LAB — URINE MICROSCOPIC-ADD ON

## 2014-02-17 LAB — PHOSPHORUS: Phosphorus: 1.7 mg/dL — ABNORMAL LOW (ref 2.3–4.6)

## 2014-02-17 LAB — MAGNESIUM: MAGNESIUM: 2.3 mg/dL (ref 1.5–2.5)

## 2014-02-17 MED ORDER — ONDANSETRON HCL 4 MG/2ML IJ SOLN
4.0000 mg | Freq: Four times a day (QID) | INTRAMUSCULAR | Status: DC
  Administered 2014-02-18 (×3): 4 mg via INTRAVENOUS
  Filled 2014-02-17 (×4): qty 2

## 2014-02-17 MED ORDER — PROMETHAZINE HCL 25 MG/ML IJ SOLN
12.5000 mg | Freq: Once | INTRAMUSCULAR | Status: AC
  Administered 2014-02-17: 12.5 mg via INTRAVENOUS
  Filled 2014-02-17: qty 1

## 2014-02-17 MED ORDER — SODIUM CHLORIDE 0.9 % IV BOLUS (SEPSIS)
1000.0000 mL | Freq: Once | INTRAVENOUS | Status: AC
  Administered 2014-02-17: 1000 mL via INTRAVENOUS

## 2014-02-17 MED ORDER — POTASSIUM CHLORIDE IN NACL 20-0.9 MEQ/L-% IV SOLN
INTRAVENOUS | Status: DC
  Administered 2014-02-17 – 2014-02-19 (×3): via INTRAVENOUS
  Filled 2014-02-17 (×8): qty 1000

## 2014-02-17 MED ORDER — DEXTROSE 5 % IV SOLN
1.0000 g | Freq: Once | INTRAVENOUS | Status: AC
  Administered 2014-02-17: 1 g via INTRAVENOUS
  Filled 2014-02-17: qty 10

## 2014-02-17 MED ORDER — DEXTROSE 5 % IV SOLN
1.0000 g | INTRAVENOUS | Status: DC
  Administered 2014-02-18 (×2): 1 g via INTRAVENOUS
  Filled 2014-02-17 (×3): qty 10

## 2014-02-17 MED ORDER — METOCLOPRAMIDE HCL 5 MG/ML IJ SOLN
10.0000 mg | Freq: Once | INTRAMUSCULAR | Status: AC
  Administered 2014-02-17: 10 mg via INTRAVENOUS
  Filled 2014-02-17: qty 2

## 2014-02-17 MED ORDER — HEPARIN SODIUM (PORCINE) 5000 UNIT/ML IJ SOLN
5000.0000 [IU] | Freq: Three times a day (TID) | INTRAMUSCULAR | Status: DC
Start: 2014-02-17 — End: 2014-02-19
  Administered 2014-02-18 (×4): 5000 [IU] via SUBCUTANEOUS
  Filled 2014-02-17 (×8): qty 1

## 2014-02-17 MED ORDER — ONDANSETRON HCL 4 MG/2ML IJ SOLN
4.0000 mg | Freq: Once | INTRAMUSCULAR | Status: DC
  Filled 2014-02-17: qty 2

## 2014-02-17 MED ORDER — POTASSIUM CHLORIDE 10 MEQ/100ML IV SOLN
10.0000 meq | INTRAVENOUS | Status: AC
  Administered 2014-02-17 – 2014-02-18 (×3): 10 meq via INTRAVENOUS
  Filled 2014-02-17 (×3): qty 100

## 2014-02-17 MED ORDER — ONDANSETRON HCL 4 MG PO TABS: 4.0000 mg | ORAL_TABLET | Freq: Three times a day (TID) | ORAL | Status: DC | PRN

## 2014-02-17 MED ORDER — PANTOPRAZOLE SODIUM 40 MG PO TBEC
40.0000 mg | DELAYED_RELEASE_TABLET | Freq: Two times a day (BID) | ORAL | Status: DC
  Administered 2014-02-18 – 2014-02-19 (×3): 40 mg via ORAL
  Filled 2014-02-17 (×5): qty 1

## 2014-02-17 MED ORDER — FAMOTIDINE IN NACL 20-0.9 MG/50ML-% IV SOLN
20.0000 mg | Freq: Once | INTRAVENOUS | Status: AC
  Administered 2014-02-17: 20 mg via INTRAVENOUS
  Filled 2014-02-17: qty 50

## 2014-02-17 NOTE — ED Provider Notes (Signed)
CSN: [id number]     Arrival date & time 02/17/14  1834 History   First MD Initiated Contact with Patient 02/17/14 2022     Chief Complaint  Patient presents with  . Abnormal Lab     (Consider location/radiation/quality/duration/timing/severity/associated sxs/prior Treatment) HPI  Dawn Howard is a 63 y.o. female past medical history significant for hypertension, prolonged QT, chronic low back pain complaining of fatigue, multiple episodes of nonbloody, nonbilious, coffee-ground emesis starting 6 days ago. Patient was evaluated by her primary care physician and found to have hypokalemia and UTI and sent to the ED for further evaluation. Patient denies cough, chest pain, shortness of breath, abdominal pain. On review of systems she notes a positive for subjective fever and diarrhea for one day. She also states that she has cloudy and concentrated urine with foul smell which she noticed 4 days ago.    Past Medical History  Diagnosis Date  . GERD (gastroesophageal reflux disease)   . Hypertension   . 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 Syncope   . Asthma   . Low back pain   . Arthritis    Past Surgical History  Procedure Laterality Date  . Left breast tumor removed- benign    . Tonsillectomy    . Cervical injections     Family History  Problem Relation Age of Onset  . COPD Mother   . Hypertension Mother   . Heart disease Mother   . Cancer Brother    History  Substance Use Topics  . Smoking status: Never Smoker   . Smokeless tobacco: Not on file  . Alcohol Use: Yes   OB History   Grav Para Term Preterm Abortions TAB SAB Ect Mult Living                 Review of Systems  10 systems reviewed and found to be negative, except as noted in the HPI.    Allergies  Acetaminophen; Codeine; Diazepam; Diphenhydramine hcl; and Penicillins  Home Medications   Prior to Admission medications   Medication Sig Start Date End Date Taking? Authorizing Provider  ALPRAZolam  (XANAX) 0.25 MG tablet Take 1 tablet (0.25 mg total) by mouth 2 (two) times daily. 12/30/13  Yes Aleksei Plotnikov V, MD  atorvastatin (LIPITOR) 20 MG tablet Take 20 mg by mouth daily.   Yes Historical Provider, MD  carvedilol (COREG) 12.5 MG tablet Take 12.5 mg by mouth daily.   Yes Historical Provider, MD  ibuprofen (ADVIL,MOTRIN) 800 MG tablet Take 400 mg by mouth every 8 (eight) hours as needed for headache (headache).   Yes Historical Provider, MD  Multiple Vitamin (MULITIVITAMIN WITH MINERALS) TABS Take 1 tablet by mouth daily.   Yes Historical Provider, MD  triamterene-hydrochlorothiazide (DYAZIDE) 37.5-25 MG per capsule Take 1 capsule by mouth daily.   Yes Historical Provider, MD  esomeprazole (NEXIUM) 20 MG capsule Take 20 mg by mouth daily at 12 noon.    Historical Provider, MD  ondansetron (ZOFRAN) 4 MG tablet Take 1 tablet (4 mg total) by mouth every 8 (eight) hours as needed for nausea or vomiting. 02/17/14   Aleksei Plotnikov V, MD  Probiotic Product (ALIGN) 4 MG CAPS Take 1 capsule (4 mg total) by mouth daily. 12/30/13   Aleksei Plotnikov V, MD   BP 103/67  Pulse 81  Temp(Src) 97.8 F (36.6 C) (Oral)  Resp 20  Ht 5\' 3"  (1.6 m)  Wt 160 lb 4.4 oz (72.7 kg)  BMI 28.40 kg/m2  SpO2 98% Physical Exam  Nursing note and vitals reviewed. Constitutional: She is oriented to person, place, and time. She appears well-developed and well-nourished. No distress.  Well-appearing  HENT:  Head: Normocephalic and atraumatic.  Mouth/Throat: Oropharynx is clear and moist.  Eyes: Conjunctivae and EOM are normal. Pupils are equal, round, and reactive to light.  Neck: Normal range of motion.  Cardiovascular: Normal rate and intact distal pulses.   Pulmonary/Chest: Effort normal and breath sounds normal. No stridor. No respiratory distress. She has no wheezes. She has no rales. She exhibits no tenderness.  Abdominal: Soft. Bowel sounds are normal. She exhibits no distension and no mass. There is no  tenderness. There is no rebound and no guarding.  Genitourinary:  No CVA tenderness to palpation bilaterally  Musculoskeletal: Normal range of motion.  Neurological: She is alert and oriented to person, place, and time.  Psychiatric: She has a normal mood and affect.    ED Course  Procedures (including critical care time) Labs Review Labs Reviewed  CBC WITH DIFFERENTIAL - Abnormal; Notable for the following:    WBC 12.5 (*)    MCHC 36.7 (*)    Neutro Abs 9.3 (*)    Monocytes Absolute 1.4 (*)    All other components within normal limits  COMPREHENSIVE METABOLIC PANEL - Abnormal; Notable for the following:    Sodium 134 (*)    Potassium 2.5 (*)    Chloride 88 (*)    Glucose, Bld 138 (*)    BUN 40 (*)    Creatinine, Ser 1.40 (*)    Albumin 3.3 (*)    AST 41 (*)    ALT 61 (*)    Alkaline Phosphatase 145 (*)    GFR calc non Af Amer 38 (*)    GFR calc Af Amer 45 (*)    Anion gap 18 (*)    All other components within normal limits  PHOSPHORUS - Abnormal; Notable for the following:    Phosphorus 1.7 (*)    All other components within normal limits  URINALYSIS, ROUTINE W REFLEX MICROSCOPIC - Abnormal; Notable for the following:    APPearance CLOUDY (*)    Hgb urine dipstick MODERATE (*)    Nitrite POSITIVE (*)    Leukocytes, UA MODERATE (*)    All other components within normal limits  URINE MICROSCOPIC-ADD ON - Abnormal; Notable for the following:    Bacteria, UA MANY (*)    Casts GRANULAR CAST (*)    All other components within normal limits  COMPREHENSIVE METABOLIC PANEL - Abnormal; Notable for the following:    Sodium 131 (*)    Potassium 2.6 (*)    Chloride 90 (*)    BUN 39 (*)    Creatinine, Ser 1.22 (*)    Albumin 2.9 (*)    AST 40 (*)    ALT 53 (*)    Alkaline Phosphatase 133 (*)    GFR calc non Af Amer 45 (*)    GFR calc Af Amer 53 (*)    Anion gap 20 (*)    All other components within normal limits  URINE CULTURE  CULTURE, BLOOD (ROUTINE X 2)  CULTURE,  BLOOD (ROUTINE X 2)  CLOSTRIDIUM DIFFICILE BY PCR  STOOL CULTURE  MAGNESIUM  CBC WITH DIFFERENTIAL  GAMMA GT  COMPREHENSIVE METABOLIC PANEL  COMPREHENSIVE METABOLIC PANEL    Imaging Review No results found.   EKG Interpretation None      MDM   Final diagnoses:  Hypokalemia  Urinary  tract infection with hematuria, site unspecified  Non-intractable vomiting with nausea, vomiting of unspecified type  Dehydration    Filed Vitals:   02/17/14 2130 02/17/14 2223 02/17/14 2238 02/17/14 2327  BP: 117/68 111/63 111/63 103/67  Pulse: 76 80 79 81  Temp:    97.8 F (36.6 C)  TempSrc:    Oral  Resp: 13  18 20   Height:    5\' 3"  (1.6 m)  Weight:    160 lb 4.4 oz (72.7 kg)  SpO2: 100% 99% 99% 98%    Medications  potassium chloride 10 mEq in 100 mL IVPB (10 mEq Intravenous New Bag/Given 02/18/14 0034)  heparin injection 5,000 Units (5,000 Units Subcutaneous Given 02/18/14 0035)  0.9 % NaCl with KCl 20 mEq/ L  infusion ( Intravenous New Bag/Given 02/17/14 2242)  pantoprazole (PROTONIX) EC tablet 40 mg (40 mg Oral Given 02/18/14 0034)  cefTRIAXone (ROCEPHIN) 1 g in dextrose 5 % 50 mL IVPB (not administered)  ondansetron (ZOFRAN) injection 4 mg (4 mg Intravenous Given 02/18/14 0034)  sodium chloride 0.9 % bolus 1,000 mL (0 mLs Intravenous Stopped 02/17/14 2242)  metoCLOPramide (REGLAN) injection 10 mg (10 mg Intravenous Given 02/17/14 2055)  promethazine (PHENERGAN) injection 12.5 mg (12.5 mg Intravenous Given 02/17/14 2129)  famotidine (PEPCID) IVPB 20 mg (0 mg Intravenous Stopped 02/17/14 2219)  cefTRIAXone (ROCEPHIN) 1 g in dextrose 5 % 50 mL IVPB (0 g Intravenous Stopped 02/17/14 2242)    Dawn Howard is a 63 y.o. female presenting with UTI, multiple episodes of vomiting, found to be hypokalemic at 2.5. Patient will be given 3 runs via IV. She start on Rocephin for her urinary tract infection and urine cultures are sent. Mild creatinine bumped. Patient will be fluid bolus.  Patient will be admitted to try hospitalist Dr. Ernestina Patches.    Monico Blitz, PA-C 02/18/14 [phone]

## 2014-02-17 NOTE — Progress Notes (Signed)
   Subjective:   Diarrhea  This is a new problem. The current episode started in the past 7 days. The problem occurs less than 2 times per day. The problem has been gradually improving. The stool consistency is described as watery. Pertinent negatives include no abdominal pain, chills, coughing, fever, headaches or sweats. Associated symptoms comments: weak. The treatment provided no relief.    F/u R cheek abscess that formed first on 12/13/13 - s/p I&D - resolved.   The patient needs to address  chronic hypertension that has been well controlled with medicines; to address chronic  hyperlipidemia controlled with medicines as well, HA  BP Readings from Last 3 Encounters:  02/17/14 128/78  12/30/13 140/82  12/23/13 130/80   Wt Readings from Last 3 Encounters:  02/17/14 159 lb (72.122 kg)  12/30/13 167 lb (75.751 kg)  12/23/13 170 lb (77.111 kg)      Review of Systems  Constitutional: Negative for fever, chills, activity change, appetite change, fatigue and unexpected weight change.  HENT: Negative for congestion, mouth sores and sinus pressure.   Eyes: Negative for visual disturbance.  Respiratory: Negative for cough and chest tightness.   Gastrointestinal: Positive for nausea and diarrhea. Negative for abdominal pain.  Genitourinary: Negative for frequency, difficulty urinating and vaginal pain.  Musculoskeletal: Negative for back pain and gait problem.  Skin: Negative for pallor and rash.  Neurological: Positive for dizziness, weakness and light-headedness. Negative for tremors, syncope, numbness and headaches.  Psychiatric/Behavioral: Negative for confusion and sleep disturbance.       Objective:   Physical Exam  Constitutional: She appears well-developed.  Looks very tired  HENT:  Head: Normocephalic.  Right Ear: External ear normal.  Left Ear: External ear normal.  Nose: Nose normal.  Mouth/Throat: Oropharynx is clear and moist. No oropharyngeal exudate.  Eyes:  Conjunctivae are normal. Pupils are equal, round, and reactive to light. Right eye exhibits no discharge. Left eye exhibits no discharge.  Neck: Normal range of motion. Neck supple. No JVD present. No tracheal deviation present. No thyromegaly present.  Cardiovascular: Normal rate, regular rhythm and normal heart sounds.   Pulmonary/Chest: No stridor. No respiratory distress. She has no wheezes.  Abdominal: Soft. Bowel sounds are normal. She exhibits no distension and no mass. There is no tenderness. There is no rebound and no guarding.  Musculoskeletal: She exhibits no edema and no tenderness.  Lymphadenopathy:    She has no cervical adenopathy.  Neurological: She displays normal reflexes. No cranial nerve deficit. She exhibits normal muscle tone. Coordination normal.  Skin: No rash noted. No erythema. No pallor.  Psychiatric: She has a normal mood and affect. Her behavior is normal. Judgment and thought content normal.     Lab Results  Component Value Date   WBC 14.0* 02/17/2014   HGB 14.1 02/17/2014   HCT 42.2 02/17/2014   PLT 288.0 02/17/2014   GLUCOSE 99 02/17/2014   CHOL 147 12/25/2013   TRIG 152.0* 12/25/2013   HDL 47.10 12/25/2013   LDLDIRECT 145.3 01/27/2013   LDLCALC 70 12/25/2013   ALT 58* 02/17/2014   AST 47* 02/17/2014   NA 130* 02/17/2014   K 2.4* 02/17/2014   CL 89* 02/17/2014   CREATININE 1.3* 02/17/2014   BUN 36* 02/17/2014   CO2 28 02/17/2014   TSH 1.54 12/25/2013          Assessment & Plan:

## 2014-02-17 NOTE — Assessment & Plan Note (Addendum)
Labs Zofran prn Imodium AD prn  5:50 pm I just spoke w/pt and instructed her to go to [facility] ER for IVF and IV potassium

## 2014-02-17 NOTE — Assessment & Plan Note (Signed)
5:50 pm I just spoke w/pt and instructed her to go to [facility] ER for IVF and IV potassium. May need the first dose of an abx IV

## 2014-02-17 NOTE — Patient Instructions (Signed)
Hold your blood pressure meds x 3 days

## 2014-02-17 NOTE — ED Notes (Signed)
Pt was seen ather primary Dr this afternoon and her potassium and sodium were low and her WBC was elevated, was told to come here for further evaluation. Pt states that she feels really weak and unable to remember things. No vomiting or diarrhea, decreased appetite

## 2014-02-17 NOTE — ED Notes (Signed)
Attempted to call report was told nurse was busy and will call back.

## 2014-02-17 NOTE — ED Notes (Signed)
Attempted to call report for second time. Nurse receiving patient still unavailable.  ED Charge RN notified.

## 2014-02-17 NOTE — Assessment & Plan Note (Signed)
5:50 pm I just spoke w/pt and instructed her to go to [facility] for IVF and IV potassium

## 2014-02-17 NOTE — H&P (Signed)
Hospitalist Admission History and Physical  Patient name: Dawn Howard Medical record number: [id number] Date of birth: 06/07/1950 Age: 63 y.o. Gender: female  Primary Care Provider: Walker Kehr, MD  Chief Complaint: dehydration, UTI, gastritis, hypokalemia  History of Present Illness:This is a 63 y.o. year old female with significant past medical history of HTN, GERD, prolonged QT syndrome w/ syncope  presenting with dehydration, UTI, gastritis, hypokalemia. Pt and husband report that pt has had decreased appetite over the past 4-5 days. State that pt has had episodes of NBNB emesis over this time frame as well. Thought that she was getting better, but by the end of the weekend, sxs got worse. Was seen by PCP today. Notable labs inlcude WBC 14, K 2.4, Cr 1.3, UA indicative of infection. Pt was redirected to ER for likely admission. States that she has had some mild NBNB diarrhea today. One episode of fever over the weekend. Tmax around 101-102. No abd pain. States that she had some mild epigastric pain that was transient-now gone. Has been taking NSAIDs for HA.  No hemiparesis or confusion. No dysuria, though with change in urine color and + foul odor. Last UTI > 30 years ago.  In the ER, afebrile. Hemodynamically stable, satting >98% on RA. WBC 12.5, K 2.5, Na 134, Cr 1.4, BUN 40. UA indicative of infection. Lipase @ 61. ALP 133, AST 47, ALT 58. EKG NSR, QTc 478 msec. Started on rocephin in ER.  Assessment and Plan: Kaslyn Richburg is a 63 y.o. year old female presenting with dehydration, UTI, gastritis, hypokalemia   Active Problems:   UTI (lower urinary tract infection)   1- UTI  -IV rocephin  -blood and urine cultures -continue to follow   2- Gastritis -high dose PPI  -noted mildly elevated ALP @ 133 -AST/ALT also mildly elevated -check abd u/s r/o GB etiology -also check GGT  -? NSAID induced vs. Above  -hold offending agent  -1-2 episodes mild ? Diarrhea today -stool  studies, follow  3- Dehydration -likely secondary to above  -hydrate  -follow -scheduled anti-emetics   4- Hypokalemia  -Likely secondary to above  -Replete -check mag level   5-AKI  -likely secondary to dehydration -BUN:Cr ratio 20:1 -gently hydrate -check FeNa   6- HTN -LLN BP on presentation -hold oral meds overnight  -reassess in am   7-Prolonged  QT -No CP, SOB, dizziness -QT 478 today  -cont to follow  -tele bed    FEN/GI: NPO for now.  Prophylaxis: sub q heparin  Disposition: pending further evaluation  Code Status:Full Code    Patient Active Problem List   Diagnosis Date Noted  . Gastroenteritis 02/17/2014  . Dehydration 02/17/2014  . Hypokalemia 02/17/2014  . UTI (urinary tract infection) 02/17/2014  . UTI (lower urinary tract infection) 02/17/2014  . Well adult exam 12/30/2013  . Abscess of right external cheek 12/23/2013  . Abscessed tooth 12/23/2013  . OTHER ANXIETY STATES 01/03/2010  . HEADACHE 01/03/2010  . INSOMNIA, CHRONIC 06/23/2008  . LONG QT SYNDROME 10/31/2007  . OSTEOARTHRITIS 01/29/2007  . LOW BACK PAIN 01/29/2007  . HYPERTENSION 11/02/2006  . ASTHMA 11/02/2006  . GERD 11/02/2006  . EDEMA LEG 11/02/2006   Past Medical History: Past Medical History  Diagnosis Date  . GERD (gastroesophageal reflux disease)   . Hypertension   . 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 Syncope   . Asthma   . Low back pain   . Arthritis     Past  Surgical History: Past Surgical History  Procedure Laterality Date  . Left breast tumor removed- benign    . Tonsillectomy    . Cervical injections      Social History: History   Social History  . Marital Status: Married    Spouse Name: N/A    Number of Children: N/A  . Years of Education: N/A   Occupational History  . retired    Social History Main Topics  . Smoking status: Never Smoker   . Smokeless tobacco: None  . Alcohol Use: Yes  . Drug Use: No  . Sexual Activity: None   Other  Topics Concern  . None   Social History Narrative  . None    Family History: Family History  Problem Relation Age of Onset  . COPD Mother   . Hypertension Mother   . Heart disease Mother   . Cancer Brother     Allergies: Allergies  Allergen Reactions  . Acetaminophen Hives    Break out  . Codeine   . Diazepam   . Diphenhydramine Hcl   . Penicillins     rash    Current Facility-Administered Medications  Medication Dose Route Frequency Provider Last Rate Last Dose  . 0.9 % NaCl with KCl 20 mEq/ L  infusion   Intravenous Continuous Shanda Howells, MD      . cefTRIAXone (ROCEPHIN) 1 g in dextrose 5 % 50 mL IVPB  1 g Intravenous Once [facility], PA-C 100 mL/hr at 02/17/14 2202 1 g at 02/17/14 2202  . cefTRIAXone (ROCEPHIN) 1 g in dextrose 5 % 50 mL IVPB  1 g Intravenous Q24H Shanda Howells, MD      . heparin injection 5,000 Units  5,000 Units Subcutaneous 3 times per day Shanda Howells, MD      . pantoprazole (PROTONIX) EC tablet 40 mg  40 mg Oral BID Shanda Howells, MD      . potassium chloride 10 mEq in 100 mL IVPB  10 mEq Intravenous Q1 Hr x 3 Nicole Pisciotta, PA-C 100 mL/hr at 02/17/14 2141 10 mEq at 02/17/14 2141   Current Outpatient Prescriptions  Medication Sig Dispense Refill  . ALPRAZolam (XANAX) 0.25 MG tablet Take 1 tablet (0.25 mg total) by mouth 2 (two) times daily.  30 tablet  1  . atorvastatin (LIPITOR) 20 MG tablet Take 20 mg by mouth daily.      . carvedilol (COREG) 12.5 MG tablet Take 12.5 mg by mouth daily.      Marland Kitchen ibuprofen (ADVIL,MOTRIN) 800 MG tablet Take 400 mg by mouth every 8 (eight) hours as needed for headache (headache).      . Multiple Vitamin (MULITIVITAMIN WITH MINERALS) TABS Take 1 tablet by mouth daily.      Marland Kitchen triamterene-hydrochlorothiazide (DYAZIDE) 37.5-25 MG per capsule Take 1 capsule by mouth daily.      Marland Kitchen esomeprazole (NEXIUM) 20 MG capsule Take 20 mg by mouth daily at 12 noon.      . ondansetron (ZOFRAN) 4 MG tablet Take 1 tablet (4  mg total) by mouth every 8 (eight) hours as needed for nausea or vomiting.  20 tablet  0  . Probiotic Product (ALIGN) 4 MG CAPS Take 1 capsule (4 mg total) by mouth daily.  30 capsule  0   Review Of Systems: 12 point ROS negative except as noted above in HPI.  Physical Exam: Filed Vitals:   02/17/14 2034  BP: 110/70  Pulse: 76  Temp:   Resp: 13  General: alert and cooperative HEENT: PERRLA, extra ocular movement intact and dry oral mucosa  Heart: S1, S2 normal, no murmur, rub or gallop, regular rate and rhythm Lungs: clear to auscultation, no wheezes or rales and unlabored breathing Abdomen: abdomen is soft without significant tenderness, masses, organomegaly or guarding, no flank pain  Extremities: extremities normal, atraumatic, no cyanosis or edema Skin:no rashes, no ecchymoses Neurology: normal without focal findings  Labs and Imaging: Lab Results  Component Value Date/Time   NA 134* 02/17/2014  8:23 PM   K 2.5* 02/17/2014  8:23 PM   CL 88* 02/17/2014  8:23 PM   CO2 28 02/17/2014  8:23 PM   BUN 40* 02/17/2014  8:23 PM   CREATININE 1.40* 02/17/2014  8:23 PM   GLUCOSE 138* 02/17/2014  8:23 PM   GLUCOSE 91 03/13/2006  3:25 PM   Lab Results  Component Value Date   WBC 12.5* 02/17/2014   HGB 14.6 02/17/2014   HCT 39.8 02/17/2014   MCV 82.7 02/17/2014   PLT 281 02/17/2014   Urinalysis    Component Value Date/Time   COLORURINE YELLOW 02/17/2014 2053   APPEARANCEUR CLOUDY* 02/17/2014 2053   LABSPEC 1.013 02/17/2014 2053   PHURINE 5.5 02/17/2014 2053   GLUCOSEU NEGATIVE 02/17/2014 2053   GLUCOSEU NEGATIVE 02/17/2014 1629   HGBUR MODERATE* 02/17/2014 2053   BILIRUBINUR NEGATIVE 02/17/2014 2053   [place] NEGATIVE 02/17/2014 2053   PROTEINUR NEGATIVE 02/17/2014 2053   UROBILINOGEN 1.0 02/17/2014 2053   NITRITE POSITIVE* 02/17/2014 2053   LEUKOCYTESUR MODERATE* 02/17/2014 2053       No results found.         Shanda Howells MD  Pager: [phone]

## 2014-02-17 NOTE — Progress Notes (Signed)
Pre visit review using our clinic review tool, if applicable. No additional management support is needed unless otherwise documented below in the visit note. 

## 2014-02-18 ENCOUNTER — Observation Stay (HOSPITAL_COMMUNITY): Payer: Medicare Other

## 2014-02-18 DIAGNOSIS — K7689 Other specified diseases of liver: Secondary | ICD-10-CM | POA: Diagnosis not present

## 2014-02-18 DIAGNOSIS — N179 Acute kidney failure, unspecified: Secondary | ICD-10-CM

## 2014-02-18 DIAGNOSIS — K2970 Gastritis, unspecified, without bleeding: Secondary | ICD-10-CM | POA: Diagnosis not present

## 2014-02-18 DIAGNOSIS — J45909 Unspecified asthma, uncomplicated: Secondary | ICD-10-CM | POA: Diagnosis present

## 2014-02-18 DIAGNOSIS — K219 Gastro-esophageal reflux disease without esophagitis: Secondary | ICD-10-CM | POA: Diagnosis present

## 2014-02-18 DIAGNOSIS — E860 Dehydration: Secondary | ICD-10-CM | POA: Diagnosis not present

## 2014-02-18 DIAGNOSIS — F419 Anxiety disorder, unspecified: Secondary | ICD-10-CM | POA: Diagnosis present

## 2014-02-18 DIAGNOSIS — N390 Urinary tract infection, site not specified: Secondary | ICD-10-CM | POA: Diagnosis not present

## 2014-02-18 DIAGNOSIS — G4700 Insomnia, unspecified: Secondary | ICD-10-CM | POA: Diagnosis present

## 2014-02-18 DIAGNOSIS — K529 Noninfective gastroenteritis and colitis, unspecified: Secondary | ICD-10-CM | POA: Diagnosis not present

## 2014-02-18 DIAGNOSIS — I4581 Long QT syndrome: Secondary | ICD-10-CM | POA: Diagnosis not present

## 2014-02-18 DIAGNOSIS — E876 Hypokalemia: Secondary | ICD-10-CM | POA: Diagnosis not present

## 2014-02-18 DIAGNOSIS — Z79899 Other long term (current) drug therapy: Secondary | ICD-10-CM | POA: Diagnosis not present

## 2014-02-18 DIAGNOSIS — M1990 Unspecified osteoarthritis, unspecified site: Secondary | ICD-10-CM | POA: Diagnosis present

## 2014-02-18 DIAGNOSIS — B9620 Unspecified Escherichia coli [E. coli] as the cause of diseases classified elsewhere: Secondary | ICD-10-CM | POA: Diagnosis present

## 2014-02-18 DIAGNOSIS — I10 Essential (primary) hypertension: Secondary | ICD-10-CM | POA: Diagnosis present

## 2014-02-18 LAB — CBC WITH DIFFERENTIAL/PLATELET
Basophils Absolute: 0 10*3/uL (ref 0.0–0.1)
Basophils Relative: 0 % (ref 0–1)
EOS ABS: 0.1 10*3/uL (ref 0.0–0.7)
Eosinophils Relative: 1 % (ref 0–5)
HCT: 31.6 % — ABNORMAL LOW (ref 36.0–46.0)
Hemoglobin: 11.3 g/dL — ABNORMAL LOW (ref 12.0–15.0)
Lymphocytes Relative: 16 % (ref 12–46)
Lymphs Abs: 1.6 10*3/uL (ref 0.7–4.0)
MCH: 30 pg (ref 26.0–34.0)
MCHC: 35.8 g/dL (ref 30.0–36.0)
MCV: 83.8 fL (ref 78.0–100.0)
MONO ABS: 1 10*3/uL (ref 0.1–1.0)
Monocytes Relative: 10 % (ref 3–12)
Neutro Abs: 7.3 10*3/uL (ref 1.7–7.7)
Neutrophils Relative %: 73 % (ref 43–77)
PLATELETS: 217 10*3/uL (ref 150–400)
RBC: 3.77 MIL/uL — ABNORMAL LOW (ref 3.87–5.11)
RDW: 12.6 % (ref 11.5–15.5)
WBC: 10 10*3/uL (ref 4.0–10.5)

## 2014-02-18 LAB — COMPREHENSIVE METABOLIC PANEL
ALK PHOS: 113 U/L (ref 39–117)
ALT: 44 U/L — ABNORMAL HIGH (ref 0–35)
ALT: 40 U/L — AB (ref 0–35)
ANION GAP: 14 (ref 5–15)
AST: 30 U/L (ref 0–37)
AST: 29 U/L (ref 0–37)
Albumin: 2.6 g/dL — ABNORMAL LOW (ref 3.5–5.2)
Albumin: 2.4 g/dL — ABNORMAL LOW (ref 3.5–5.2)
Alkaline Phosphatase: 106 U/L (ref 39–117)
Anion gap: 12 (ref 5–15)
BILIRUBIN TOTAL: 0.6 mg/dL (ref 0.3–1.2)
BUN: 24 mg/dL — AB (ref 6–23)
BUN: 21 mg/dL (ref 6–23)
CHLORIDE: 98 meq/L (ref 96–112)
CO2: 24 meq/L (ref 19–32)
CO2: 24 mEq/L (ref 19–32)
CREATININE: 1.04 mg/dL (ref 0.50–1.10)
CREATININE: 1.21 mg/dL — AB (ref 0.50–1.10)
Calcium: 9.2 mg/dL (ref 8.4–10.5)
Calcium: 8.8 mg/dL (ref 8.4–10.5)
Chloride: 104 mEq/L (ref 96–112)
GFR calc Af Amer: 64 mL/min — ABNORMAL LOW (ref >90)
GFR calc non Af Amer: 55 mL/min — ABNORMAL LOW (ref >90)
GFR calc non Af Amer: 46 mL/min — ABNORMAL LOW (ref >90)
GFR, EST AFRICAN AMERICAN: 53 mL/min — AB (ref >90)
Glucose, Bld: 120 mg/dL — ABNORMAL HIGH (ref 70–99)
Glucose, Bld: 106 mg/dL — ABNORMAL HIGH (ref 70–99)
Potassium: 2.7 mEq/L — CL (ref 3.7–5.3)
Potassium: 3.1 mEq/L — ABNORMAL LOW (ref 3.7–5.3)
SODIUM: 140 meq/L (ref 137–147)
Sodium: 136 mEq/L — ABNORMAL LOW (ref 137–147)
TOTAL PROTEIN: 5.8 g/dL — AB (ref 6.0–8.3)
Total Bilirubin: 0.4 mg/dL (ref 0.3–1.2)
Total Protein: 6.4 g/dL (ref 6.0–8.3)

## 2014-02-18 LAB — GAMMA GT: GGT: 108 U/L — AB (ref 7–51)

## 2014-02-18 MED ORDER — POTASSIUM CHLORIDE CRYS ER 20 MEQ PO TBCR
40.0000 meq | EXTENDED_RELEASE_TABLET | Freq: Two times a day (BID) | ORAL | Status: AC
  Administered 2014-02-18 (×2): 40 meq via ORAL
  Filled 2014-02-18 (×2): qty 2

## 2014-02-18 NOTE — Progress Notes (Signed)
[facility] PROGRESS NOTE  Dawn Howard OIZ:[id number] DOB: 31-Jan-1951 DOA: 02/17/2014 PCP: Walker Kehr, MD  Assessment/Plan: 1- UTI  -Initially started on empiric IV rocephin  -blood and urine cultures pending -Leukocytosis resolved  2- Gastritis/suspected gastroenteritis -high dose PPI  -noted mildly elevated ALP @ 133  -AST/ALT also mildly elevated  -abd u/s neg for GB etiology -GGT elevated -? NSAID induced vs. Above  -hold offending agent  -1-2 episodes mild ? Diarrhea today  - much improved -stool studies, follow   3- Dehydration  -likely secondary to above  -Cont on IVF hydrate  -scheduled anti-emetics   4- Hypokalemia  -Likely secondary to above  - Cont to replace as needed  5-AKI  -likely secondary to dehydration  -BUN:Cr ratio 20:1  -Cont gentle hydration  -Cr improved this AM  6- HTN  -slightly low BP overnight -hold oral meds overnight   7-Prolonged QT  -No CP, SOB, dizziness  -QT 478 today  -cont to follow  -tele bed   Code Status: Full Family Communication: Pt in room (indicate person spoken with, relationship, and if by phone, the number) Disposition Plan: Home when stable   Consultants:  none  Procedures:    Antibiotics:  Rocephin 10/13>>>   HPI/Subjective: Pt feels much better today  Objective: Filed Vitals:   02/17/14 2223 02/17/14 2238 02/17/14 2327 02/18/14 0649  BP: 111/63 111/63 103/67 105/60  Pulse: 80 79 81 77  Temp:   97.8 F (36.6 C) 98.2 F (36.8 C)  TempSrc:   Oral Oral  Resp:  18 20 16   Height:   5\' 3"  (1.6 m)   Weight:   72.7 kg (160 lb 4.4 oz)   SpO2: 99% 99% 98% 99%   No intake or output data in the 24 hours ending 02/18/14 1127 Filed Weights   02/17/14 2327  Weight: 72.7 kg (160 lb 4.4 oz)    Exam:   General:  Awake, in nad  Cardiovascular: regular, s1, s2  Respiratory: normal resp effort, no wheezing  Abdomen: soft,nondistended  Musculoskeletal: perfused,no clubbing    Data Reviewed: Basic Metabolic Panel:  Recent Labs Lab 02/17/14 1629 02/17/14 2023 02/17/14 2200  NA 130* 134* 131*  K 2.4* 2.5* 2.6*  CL 89* 88* 90*  CO2 28 28 21   GLUCOSE 99 138* 84  BUN 36* 40* 39*  CREATININE 1.3* 1.40* 1.22*  CALCIUM 9.8 10.3 9.5  MG  --  2.3  --   PHOS  --  1.7*  --    Liver Function Tests:  Recent Labs Lab 02/17/14 1629 02/17/14 2023 02/17/14 2200  AST 47* 41* 40*  ALT 58* 61* 53*  ALKPHOS 133* 145* 133*  BILITOT 1.1 0.9 0.7  PROT 7.8 8.0 7.1  ALBUMIN 3.0* 3.3* 2.9*    Recent Labs Lab 02/17/14 1629  LIPASE 61.0*   No results found for this basename: AMMONIA,  in the last 168 hours CBC:  Recent Labs Lab 02/17/14 1629 02/17/14 2023 02/18/14 0545  WBC 14.0* 12.5* 10.0  NEUTROABS 11.1* 9.3* 7.3  HGB 14.1 14.6 11.3*  HCT 42.2 39.8 31.6*  MCV 89.2 82.7 83.8  PLT 288.0 281 217   Cardiac Enzymes: No results found for this basename: CKTOTAL, CKMB, CKMBINDEX, TROPONINI,  in the last 168 hours BNP (last 3 results) No results found for this basename: PROBNP,  in the last 8760 hours CBG: No results found for this basename: GLUCAP,  in the last 168 hours  No results found for this or  any previous visit (from the past 240 hour(s)).   Studies: No results found.  Scheduled Meds: . cefTRIAXone (ROCEPHIN)  IV  1 g Intravenous Q24H  . heparin  5,000 Units Subcutaneous 3 times per day  . ondansetron (ZOFRAN) IV  4 mg Intravenous 4 times per day  . pantoprazole  40 mg Oral BID   Continuous Infusions: . 0.9 % NaCl with KCl 20 mEq / L 125 mL/hr at 02/18/14 6767    Active Problems:   UTI (lower urinary tract infection)   AKI (acute kidney injury)  Time spent: 8min  Adolpho Meenach, [facility] Pager [phone]. If 7PM-7AM, please contact night-coverage at [email or web address], password [facility] 02/18/2014, 11:27 AM  LOS: 1 day

## 2014-02-18 NOTE — Progress Notes (Signed)
UR completed 

## 2014-02-19 DIAGNOSIS — I10 Essential (primary) hypertension: Secondary | ICD-10-CM

## 2014-02-19 DIAGNOSIS — K529 Noninfective gastroenteritis and colitis, unspecified: Secondary | ICD-10-CM

## 2014-02-19 DIAGNOSIS — I4581 Long QT syndrome: Secondary | ICD-10-CM

## 2014-02-19 DIAGNOSIS — E876 Hypokalemia: Secondary | ICD-10-CM

## 2014-02-19 DIAGNOSIS — N390 Urinary tract infection, site not specified: Principal | ICD-10-CM

## 2014-02-19 DIAGNOSIS — E860 Dehydration: Secondary | ICD-10-CM

## 2014-02-19 LAB — COMPREHENSIVE METABOLIC PANEL
ALT: 39 U/L — AB (ref 0–35)
AST: 28 U/L (ref 0–37)
Albumin: 2.4 g/dL — ABNORMAL LOW (ref 3.5–5.2)
Alkaline Phosphatase: 108 U/L (ref 39–117)
Anion gap: 12 (ref 5–15)
BILIRUBIN TOTAL: 0.3 mg/dL (ref 0.3–1.2)
BUN: 19 mg/dL (ref 6–23)
CHLORIDE: 105 meq/L (ref 96–112)
CO2: 22 meq/L (ref 19–32)
Calcium: 9.1 mg/dL (ref 8.4–10.5)
Creatinine, Ser: 1.13 mg/dL — ABNORMAL HIGH (ref 0.50–1.10)
GFR calc Af Amer: 58 mL/min — ABNORMAL LOW (ref >90)
GFR calc non Af Amer: 50 mL/min — ABNORMAL LOW (ref >90)
Glucose, Bld: 91 mg/dL (ref 70–99)
POTASSIUM: 3.7 meq/L (ref 3.7–5.3)
SODIUM: 139 meq/L (ref 137–147)
Total Protein: 5.9 g/dL — ABNORMAL LOW (ref 6.0–8.3)

## 2014-02-19 LAB — CBC WITH DIFFERENTIAL/PLATELET
Basophils Absolute: 0.1 10*3/uL (ref 0.0–0.1)
Basophils Relative: 1 % (ref 0–1)
EOS PCT: 2 % (ref 0–5)
Eosinophils Absolute: 0.2 10*3/uL (ref 0.0–0.7)
HCT: 31.5 % — ABNORMAL LOW (ref 36.0–46.0)
HEMOGLOBIN: 11 g/dL — AB (ref 12.0–15.0)
LYMPHS PCT: 27 % (ref 12–46)
Lymphs Abs: 2.1 10*3/uL (ref 0.7–4.0)
MCH: 30.1 pg (ref 26.0–34.0)
MCHC: 34.9 g/dL (ref 30.0–36.0)
MCV: 86.3 fL (ref 78.0–100.0)
MONO ABS: 0.8 10*3/uL (ref 0.1–1.0)
MONOS PCT: 11 % (ref 3–12)
NEUTROS PCT: 59 % (ref 43–77)
Neutro Abs: 4.5 10*3/uL (ref 1.7–7.7)
PLATELETS: 229 10*3/uL (ref 150–400)
RBC: 3.65 MIL/uL — ABNORMAL LOW (ref 3.87–5.11)
RDW: 12.9 % (ref 11.5–15.5)
WBC: 7.7 10*3/uL (ref 4.0–10.5)

## 2014-02-19 LAB — CLOSTRIDIUM DIFFICILE BY PCR: CDIFFPCR: NEGATIVE

## 2014-02-19 MED ORDER — CEFUROXIME AXETIL 250 MG PO TABS: 250.0000 mg | ORAL_TABLET | Freq: Two times a day (BID) | ORAL | Status: DC

## 2014-02-19 NOTE — Discharge Instructions (Signed)
Infection Control in the Home These are general guidelines on infection control for family and friends providing care in the home. This information is not a substitute for instructions given by your doctor. HOW INFECTIONS ARE SPREAD In order for an infection to spread, the following must be present:  A germ - virus or bacteria.  A place for the germ to live - person, animal, plant, food, soil or water.  A susceptible host - a person who does not have resistance (immunity) to the germ.  A way for the germ to enter the host:  Direct contact - shaking hands, hugging, etc.  Indirect contact - when food, water, bandages or other substances contaminated by the germ enter the host.  Droplets - such as those produced by a sneeze or cough which can travel several feet in the air.  Tiny dust particles that travel long distances in the air.  Cover nose and mouth with a tissue when coughing or sneezing or cough into your elbow.  Dispose of used tissue in the nearest waste receptacle.  Clean hands with soap and water, an alcohol-based rub after touching respiratory secretions or handling contaminated objects. CLEANLINESS IS THE KEY!  Everyone must wash hands before handling or eating food, and after using the toilet, changing a diaper, touching pets, money, uncooked food, coughing, sneezing, blowing nose, touching nose, eyes or mouth.  Caregivers must wash hands before and after giving care: cleaning wounds or carrying for catheters, changing bandages or handling soiled linens, and giving mouth care or cleaning private parts.  Wash hands properly - use lots of warm, running water and soap to lather hands and wrist. Scrub for at least 15 seconds including under the fingernails.  Rinse well with hands pointing down to allow germs to flow into sink. Dry with clean paper or cloth towel. Use lotion for dry skin.  Stock up on cleaning supplies: disinfectants, separate sponges for bathroom and  kitchen, paper towels, utility gloves (replace when cracked, torn or start to peel).  Use bleach safely - never mix with other cleaning products!  Take care of your cleaning supplies - toilet brushes, mops and sponges can breed germs. Soak them in bleach and water for 5 minutes after each use. HOME CARE INSTRUCTIONS  Provide good ventilation in the home.  Be cautious with pet care - do not let those with weakened immune systems touch bird dropping, fish tank water or cat feces (change litter daily). Bathroom  Provide liquid soap - bar soap can become a home for bacteria.  Change towels and washcloths daily to prevent possible fungal infections.  Change toothbrushes often and store them separately in a clean dry place.  Disinfect the toilet.  Clean the tub, shower and sink with standard cleaning products.  Mop the floor once a week with a standard cleaner.  Do not share personal items such as razors, toothbrushes, glasses, combs, brushes, towels and washcloths. Kitchen  Store food carefully: refrigerate leftovers promptly in covered containers. Throw out stale or spoiled food. Clean the inside of refrigerator weekly. Keep refrigerator set at 54F or less and freezer at [facility] or less. Thaw foods in the refrigerator or microwave. Serve foods at proper temperature.  Take care with meat: never eat raw. Make sure it cooked to the appropriate temperature and cook eggs until they are firm.  Wash fruits and vegetables under running water.  Use separate cutting boards, plates and utensils for raw and cooked foods.  Keep work surfaces clean.  Use a clean spoon each time you sample food while cooking.  Wash dishes and utensils in hot soapy water, air dry or use a dishwasher.  Do not share forks, cups or spoons during meals.  Do not pour used mop water down the sink. Pour it down the toilet instead. Doing laundry  Wear gloves if laundry is visibly soiled.  Change linens once a week  or when soiled.  Do not shake soiled linens - you may send germs into the air. Dispose of contaminated wastes  Place dressings, sanitary or incontinence pads, diapers, or gloves in plastic garbage bags for disposal. [place] OF INFECTION  Inflamed skin-skin that is red, hot, swollen or has a rash.  Fever or chills.  Pus-green or yellow drainage from a wound.  Nausea or vomiting.  Persistent diarrhea.  Cough or sore throat.  Painful urination. Document Released: 02/01/2008 Document Revised: 07/17/2011 Document Reviewed: 02/01/2008 [facility] Patient Information 2015 [place], [place]. This information is not intended to replace advice given to you by your health care provider. Make sure you discuss any questions you have with your health care provider.

## 2014-02-19 NOTE — Discharge Summary (Addendum)
Physician Discharge Summary  Dawn Howard XBW:[id number] DOB: 03-16-1951 DOA: 02/17/2014  PCP: Walker Kehr, MD  Admit date: 02/17/2014 Discharge date: 02/19/2014  Time spent: 35 minutes  Recommendations for Outpatient Follow-up:  1. Follow up with PCP as scheduled in 11/15 2. Consider repeat comprehensive profile in 1-2 weeks 3. Note, pt's diuretic was held secondary to mild dehydration and for borderline low BP. Pt's was advised to resume only her home beta blocker. Further BP med changes will be deferred to PCP 4. Follow up final urine culture results  Discharge Diagnoses:  Active Problems:   UTI (lower urinary tract infection)   AKI (acute kidney injury)  Discharge Condition: Improved  Diet recommendation: Regular  Filed Weights   02/17/14 2327  Weight: 72.7 kg (160 lb 4.4 oz)    History of present illness:  See admit h and p from 10/13 for details. Briefly, pt presents with profound hypokalemia, dehydration and UTI. Pt was admitted for further workup  Hospital Course:  1- UTI  -Initially started on empiric IV rocephin  -blood cultures neg x 3 by 10/15 12pm -Urine cx with >100,000 gm neg rods  -Leukocytosis resolved  -Pt clinically improved with rocephin with no adverse drug effects -Planned to complete course with PO ceftin as pt tolerated rocephin, however, pt reports severe PCN anaphylactic reaction. Will complete course with cipro instead. PCP to follow up final urine cx results  2- Gastritis/suspected gastroenteritis  -high dose PPI  -noted mildly elevated ALP @ 133 on admit -AST/ALT also mildly elevated  -abd u/s neg for GB etiology  -GGT was mildly elevated  -? NSAID induced vs. Above  -mild diarrhea much improved  -stool studies neg for cdiff -suspect possible mild infectious gastroenteritis  3- Dehydration  -likely secondary to above  -Improved with hydration  4- Hypokalemia  -Likely secondary to above  -Replaced  5-AKI  -likely secondary  to dehydration  -BUN:Cr ratio 20:1  -Cont gentle hydration  -Cr improved  6- HTN  -slightly low BP -Resume only beta blocker on discharge, hold diuretics  7-Prolonged QT  -No CP, SOB, dizziness  -QT 478   Consultations:  none  Discharge Exam: Filed Vitals:   02/18/14 0649 02/18/14 1448 02/18/14 2227 02/19/14 0704  BP: 105/60 118/59 107/54 125/71  Pulse: 77 84 87 79  Temp: 98.2 F (36.8 C) 97.4 F (36.3 C) 98.2 F (36.8 C) 97.8 F (36.6 C)  TempSrc: Oral Oral Oral Oral  Resp: 16 16 18 16   Height:      Weight:      SpO2: 99% 98% 96% 99%    General: awake, in nad Cardiovascular: regular, s1, s2 Respiratory: normal resp effort, no wheezing  Discharge Instructions     Medication List    STOP taking these medications       atorvastatin 20 MG tablet  Commonly known as:  LIPITOR     triamterene-hydrochlorothiazide 37.5-25 MG per capsule  Commonly known as:  DYAZIDE      TAKE these medications       ALIGN 4 MG Caps  Take 1 capsule (4 mg total) by mouth daily.     ALPRAZolam 0.25 MG tablet  Commonly known as:  XANAX  Take 1 tablet (0.25 mg total) by mouth 2 (two) times daily.     carvedilol 12.5 MG tablet  Commonly known as:  COREG  Take 12.5 mg by mouth daily.     cefUROXime 250 MG tablet  Commonly known as:  CEFTIN  Take  1 tablet (250 mg total) by mouth 2 (two) times daily with a meal.     esomeprazole 20 MG capsule  Commonly known as:  NEXIUM  Take 20 mg by mouth daily at 12 noon.     ibuprofen 800 MG tablet  Commonly known as:  ADVIL,MOTRIN  Take 400 mg by mouth every 8 (eight) hours as needed for headache (headache).     multivitamin with minerals Tabs tablet  Take 1 tablet by mouth daily.     ondansetron 4 MG tablet  Commonly known as:  ZOFRAN  Take 1 tablet (4 mg total) by mouth every 8 (eight) hours as needed for nausea or vomiting.       Allergies  Allergen Reactions  . Acetaminophen Hives    Break out  . Codeine   . Diazepam    . Diphenhydramine Hcl   . Penicillins     rash   Follow-up Information   Follow up with Walker Kehr, MD. (keep scheduled appointment)    Specialty:  Internal Medicine   Contact information:   [address] [place] [address] [phone]        The results of significant diagnostics from this hospitalization (including imaging, microbiology, ancillary and laboratory) are listed below for reference.    Significant Diagnostic Studies: US Abdomen Complete  02/18/2014   CLINICAL DATA:  Nausea and vomiting. Gastritis. Urinary tract infection. Elevated liver enzymes.  EXAM: ULTRASOUND ABDOMEN COMPLETE  COMPARISON:  None.  FINDINGS: Gallbladder: No gallstones or wall thickening visualized. No sonographic Murphy sign noted. Wall thickness is within normal limits at 1.3 mm.  Common bile duct: Diameter: 2.5 mm, within normal limits.  Liver: A benign appearing cyst is noted within the right lobe of the liver, measuring 2.9 cm maximally.  IVC: No abnormality visualized.  Pancreas: Visualized portion unremarkable.  Spleen: The spleen is of normal size and echotexture measuring 8.7 cm, within normal limits.  Right Kidney: Length: 13.1 cm, within normal limits. Echogenicity within normal limits. No mass or hydronephrosis visualized.  Left Kidney: Length: 10.8 cm, within normal limits. Echogenicity within normal limits. No mass or hydronephrosis visualized.  Abdominal aorta: No aneurysm visualized.  Other findings: None.  IMPRESSION: 1. No acute or focal lesion to explain the patient's symptoms. 2. Benign appearing cyst in the right lobe of the liver.   Electronically Signed   By: Lawrence Santiago M.D.   On: 02/18/2014 12:26    Microbiology: Recent Results (from the past 240 hour(s))  URINE CULTURE     Status: None   Collection Time    02/17/14  8:53 PM      Result Value Ref Range Status   Specimen Description URINE, RANDOM   Final   Special Requests NONE   Final   Culture  Setup Time     Final    Value: 02/18/2014 09:17     Performed at [facility]     Final   Value: >=100,000 COLONIES/ML     Performed at [facility]   Culture     Final   Value: [place]     Performed at [facility]   Report Status PENDING   Incomplete  CULTURE, BLOOD (ROUTINE X 2)     Status: None   Collection Time    02/17/14 10:00 PM      Result Value Ref Range Status   Specimen Description BLOOD RIGHT FOREARM   Final  Special Requests BOTTLES DRAWN AEROBIC AND ANAEROBIC 4CC   Final   Culture  Setup Time     Final   Value: 02/18/2014 03:41     Performed at [facility]   Culture     Final   Value:        BLOOD CULTURE RECEIVED NO GROWTH TO DATE CULTURE WILL BE HELD FOR 5 DAYS BEFORE ISSUING A FINAL NEGATIVE REPORT     Performed at [facility]   Report Status PENDING   Incomplete  CULTURE, BLOOD (ROUTINE X 2)     Status: None   Collection Time    02/17/14 10:00 PM      Result Value Ref Range Status   Specimen Description BLOOD LEFT ARM   Final   Special Requests BOTTLES DRAWN AEROBIC AND ANAEROBIC 4CC   Final   Culture  Setup Time     Final   Value: 02/18/2014 03:41     Performed at [facility]   Culture     Final   Value:        BLOOD CULTURE RECEIVED NO GROWTH TO DATE CULTURE WILL BE HELD FOR 5 DAYS BEFORE ISSUING A FINAL NEGATIVE REPORT     Performed at [facility]   Report Status PENDING   Incomplete  CLOSTRIDIUM DIFFICILE BY PCR     Status: None   Collection Time    02/19/14  1:49 AM      Result Value Ref Range Status   C difficile by pcr NEGATIVE  NEGATIVE Final   Comment: Performed at [facility]: Basic Metabolic Panel:  Recent Labs Lab 02/17/14 1629 02/17/14 2023 02/17/14 2200 02/18/14 1335 02/18/14 2201 02/19/14 0355  NA 130* 134* 131* 136* 140 139  K 2.4* 2.5* 2.6* 2.7* 3.1* 3.7  CL 89* 88* 90* 98 104 105  CO2 28 28 21 24 24 22   GLUCOSE 99 138* 84 120* 106* 91  BUN  36* 40* 39* 24* 21 19  CREATININE 1.3* 1.40* 1.22* 1.04 1.21* 1.13*  CALCIUM 9.8 10.3 9.5 9.2 8.8 9.1  MG  --  2.3  --   --   --   --   PHOS  --  1.7*  --   --   --   --    Liver Function Tests:  Recent Labs Lab 02/17/14 2023 02/17/14 2200 02/18/14 1335 02/18/14 2201 02/19/14 0355  AST 41* 40* 30 29 28   ALT 61* 53* 44* 40* 39*  ALKPHOS 145* 133* 113 106 108  BILITOT 0.9 0.7 0.6 0.4 0.3  PROT 8.0 7.1 6.4 5.8* 5.9*  ALBUMIN 3.3* 2.9* 2.6* 2.4* 2.4*    Recent Labs Lab 02/17/14 1629  LIPASE 61.0*   No results found for this basename: AMMONIA,  in the last 168 hours CBC:  Recent Labs Lab 02/17/14 1629 02/17/14 2023 02/18/14 0545 02/19/14 0355  WBC 14.0* 12.5* 10.0 7.7  NEUTROABS 11.1* 9.3* 7.3 4.5  HGB 14.1 14.6 11.3* 11.0*  HCT 42.2 39.8 31.6* 31.5*  MCV 89.2 82.7 83.8 86.3  PLT 288.0 281 217 229   Cardiac Enzymes: No results found for this basename: CKTOTAL, CKMB, CKMBINDEX, TROPONINI,  in the last 168 hours BNP: BNP (last 3 results) No results found for this basename: PROBNP,  in the last 8760 hours CBG: No results found for this basename: GLUCAP,  in the last 168 hours  Signed:  CHIU, STEPHEN K  [facility] 02/19/2014, 12:38  PM

## 2014-02-20 LAB — URINE CULTURE

## 2014-02-22 NOTE — ED Provider Notes (Signed)
Medical screening examination/treatment/procedure(s) were performed by non-physician practitioner and as supervising physician I was immediately available for consultation/collaboration.   EKG Interpretation   Date/Time:  Tuesday February 17 2014 19:37:47 EDT Ventricular Rate:  82 PR Interval:  151 QRS Duration: 109 QT Interval:  410 QTC Calculation: 479 R Axis:   -15 Text Interpretation:  Sinus rhythm Borderline left axis deviation Low  voltage, precordial leads ED PHYSICIAN INTERPRETATION AVAILABLE IN [facility] Confirmed by TEST, Record ([id number]) on 02/19/2014 7:02:54 AM       Charlesetta Shanks, MD 02/22/14 0010

## 2014-02-23 LAB — STOOL CULTURE

## 2014-02-24 LAB — CULTURE, BLOOD (ROUTINE X 2)
CULTURE: NO GROWTH
Culture: NO GROWTH

## 2014-03-06 ENCOUNTER — Encounter: Payer: Self-pay | Admitting: Internal Medicine

## 2014-03-06 ENCOUNTER — Ambulatory Visit (INDEPENDENT_AMBULATORY_CARE_PROVIDER_SITE_OTHER): Payer: Medicare Other | Admitting: Internal Medicine

## 2014-03-06 VITALS — BP 130/86 | HR 84 | Temp 97.8°F | Ht 63.0 in | Wt 159.0 lb

## 2014-03-06 DIAGNOSIS — I499 Cardiac arrhythmia, unspecified: Secondary | ICD-10-CM

## 2014-03-06 DIAGNOSIS — I10 Essential (primary) hypertension: Secondary | ICD-10-CM

## 2014-03-06 DIAGNOSIS — R197 Diarrhea, unspecified: Secondary | ICD-10-CM | POA: Diagnosis not present

## 2014-03-06 DIAGNOSIS — L0201 Cutaneous abscess of face: Secondary | ICD-10-CM

## 2014-03-06 DIAGNOSIS — J4520 Mild intermittent asthma, uncomplicated: Secondary | ICD-10-CM

## 2014-03-06 DIAGNOSIS — M5440 Lumbago with sciatica, unspecified side: Secondary | ICD-10-CM | POA: Diagnosis not present

## 2014-03-06 DIAGNOSIS — Z23 Encounter for immunization: Secondary | ICD-10-CM | POA: Diagnosis not present

## 2014-03-06 MED ORDER — ALPRAZOLAM 0.25 MG PO TABS: 0.2500 mg | ORAL_TABLET | Freq: Two times a day (BID) | ORAL | Status: DC

## 2014-03-06 NOTE — Assessment & Plan Note (Signed)
Continue with current prescription therapy as reflected on the Med list.  

## 2014-03-06 NOTE — Progress Notes (Signed)
Pre visit review using our clinic review tool, if applicable. No additional management support is needed unless otherwise documented below in the visit note. 

## 2014-03-06 NOTE — Assessment & Plan Note (Addendum)
Continue with current prescription therapy as reflected on the Med list. No relapse

## 2014-03-06 NOTE — Progress Notes (Signed)
Subjective:   HPI Hosp f/u: Admit date: 02/17/2014  Discharge date: 02/19/2014  Time spent: 35 minutes  Recommendations for Outpatient Follow-up:  1. Follow up with PCP as scheduled in 11/15 2. Consider repeat comprehensive profile in 1-2 weeks 3. Note, pt's diuretic was held secondary to mild dehydration and for borderline low BP. Pt's was advised to resume only her home beta blocker. Further BP med changes will be deferred to PCP 4. Follow up final urine culture results Discharge Diagnoses:  Active Problems:  UTI (lower urinary tract infection)  AKI (acute kidney injury)  Discharge Condition: Improved  Diet recommendation: Regular  Filed Weights    02/17/14 2327   Weight:  72.7 kg (160 lb 4.4 oz)   History of present illness:  See admit h and p from 10/13 for details. Briefly, pt presents with profound hypokalemia, dehydration and UTI. Pt was admitted for further workup  Hospital Course:  1- UTI  -Initially started on empiric IV rocephin  -blood cultures neg x 3 by 10/15 12pm  -Urine cx with >100,000 gm neg rods  -Leukocytosis resolved  -Pt clinically improved with rocephin with no adverse drug effects  -Planned to complete course with PO ceftin as pt tolerated rocephin, however, pt reports severe PCN anaphylactic reaction. Will complete course with cipro instead. PCP to follow up final urine cx results  2- Gastritis/suspected gastroenteritis  -high dose PPI  -noted mildly elevated ALP @ 133 on admit  -AST/ALT also mildly elevated  -abd u/s neg for GB etiology  -GGT was mildly elevated  -? NSAID induced vs. Above  -mild diarrhea much improved  -stool studies neg for cdiff  -suspect possible mild infectious gastroenteritis  3- Dehydration  -likely secondary to above  -Improved with hydration  4- Hypokalemia  -Likely secondary to above  -Replaced  5-AKI  -likely secondary to dehydration  -BUN:Cr ratio 20:1  -Cont gentle hydration  -Cr improved  6- HTN    -slightly low BP  -Resume only beta blocker on discharge, hold diuretics  7-Prolonged QT  -No CP, SOB, dizziness  -QT 478      F/u R cheek abscess that formed first on 12/13/13 - s/p I&D - resolved.   The patient needs to address  chronic hypertension that has been well controlled with medicines; to address chronic  hyperlipidemia controlled with medicines as well, HA  BP Readings from Last 3 Encounters:  03/06/14 130/86  02/19/14 125/71  02/17/14 128/78   Wt Readings from Last 3 Encounters:  03/06/14 159 lb (72.122 kg)  02/17/14 160 lb 4.4 oz (72.7 kg)  02/17/14 159 lb (72.122 kg)      Review of Systems  Constitutional: Negative for activity change, appetite change, fatigue and unexpected weight change.  HENT: Negative for congestion, mouth sores and sinus pressure.   Eyes: Negative for visual disturbance.  Respiratory: Negative for chest tightness.   Gastrointestinal: Negative for nausea.  Genitourinary: Negative for frequency, difficulty urinating and vaginal pain.  Musculoskeletal: Negative for back pain and gait problem.  Skin: Negative for pallor and rash.  Neurological: Negative for dizziness, tremors, syncope, weakness, light-headedness and numbness.  Psychiatric/Behavioral: Negative for confusion and sleep disturbance.       Objective:   Physical Exam  Constitutional: She appears well-developed.  Looks well  HENT:  Head: Normocephalic.  Right Ear: External ear normal.  Left Ear: External ear normal.  Nose: Nose normal.  Mouth/Throat: Oropharynx is clear and moist. No oropharyngeal exudate.  Eyes: Conjunctivae are normal.  Pupils are equal, round, and reactive to light. Right eye exhibits no discharge. Left eye exhibits no discharge.  Neck: Normal range of motion. Neck supple. No JVD present. No tracheal deviation present. No thyromegaly present.  Cardiovascular: Normal rate, regular rhythm and normal heart sounds.   Pulmonary/Chest: No stridor. No  respiratory distress. She has no wheezes.  Abdominal: Soft. Bowel sounds are normal. She exhibits no distension and no mass. There is no tenderness. There is no rebound and no guarding.  Musculoskeletal: She exhibits no edema and no tenderness.  Lymphadenopathy:    She has no cervical adenopathy.  Neurological: She displays normal reflexes. No cranial nerve deficit. She exhibits normal muscle tone. Coordination normal.  Skin: No rash noted. No erythema. No pallor.  Psychiatric: She has a normal mood and affect. Her behavior is normal. Judgment and thought content normal.     Lab Results  Component Value Date   WBC 7.7 02/19/2014   HGB 11.0* 02/19/2014   HCT 31.5* 02/19/2014   PLT 229 02/19/2014   GLUCOSE 91 02/19/2014   CHOL 147 12/25/2013   TRIG 152.0* 12/25/2013   HDL 47.10 12/25/2013   LDLDIRECT 145.3 01/27/2013   LDLCALC 70 12/25/2013   ALT 39* 02/19/2014   AST 28 02/19/2014   NA 139 02/19/2014   K 3.7 02/19/2014   CL 105 02/19/2014   CREATININE 1.13* 02/19/2014   BUN 19 02/19/2014   CO2 22 02/19/2014   TSH 1.54 12/25/2013          Assessment & Plan:

## 2014-03-06 NOTE — Assessment & Plan Note (Signed)
Ibuprofen prn 

## 2014-03-31 ENCOUNTER — Ambulatory Visit (INDEPENDENT_AMBULATORY_CARE_PROVIDER_SITE_OTHER): Payer: Medicare Other | Admitting: Internal Medicine

## 2014-03-31 ENCOUNTER — Encounter: Payer: Self-pay | Admitting: Internal Medicine

## 2014-03-31 VITALS — BP 140/80 | HR 87 | Temp 98.4°F | Wt 167.0 lb

## 2014-03-31 DIAGNOSIS — M545 Low back pain, unspecified: Secondary | ICD-10-CM

## 2014-03-31 DIAGNOSIS — M1990 Unspecified osteoarthritis, unspecified site: Secondary | ICD-10-CM | POA: Diagnosis not present

## 2014-03-31 DIAGNOSIS — Z23 Encounter for immunization: Secondary | ICD-10-CM | POA: Diagnosis not present

## 2014-03-31 DIAGNOSIS — J4520 Mild intermittent asthma, uncomplicated: Secondary | ICD-10-CM

## 2014-03-31 DIAGNOSIS — R609 Edema, unspecified: Secondary | ICD-10-CM

## 2014-03-31 DIAGNOSIS — M79605 Pain in left leg: Secondary | ICD-10-CM

## 2014-03-31 DIAGNOSIS — G894 Chronic pain syndrome: Secondary | ICD-10-CM | POA: Diagnosis not present

## 2014-03-31 DIAGNOSIS — M5030 Other cervical disc degeneration, unspecified cervical region: Secondary | ICD-10-CM | POA: Diagnosis not present

## 2014-03-31 MED ORDER — NAPROXEN 500 MG PO TABS: 500.0000 mg | ORAL_TABLET | Freq: Two times a day (BID) | ORAL | Status: DC | PRN

## 2014-03-31 MED ORDER — FUROSEMIDE 20 MG PO TABS: 20.0000 mg | ORAL_TABLET | Freq: Every day | ORAL | Status: DC | PRN

## 2014-03-31 NOTE — Progress Notes (Signed)
Pre visit review using our clinic review tool, if applicable. No additional management support is needed unless otherwise documented below in the visit note. 

## 2014-03-31 NOTE — Assessment & Plan Note (Signed)
Naproxen prn w/caution  Potential benefits of a long term NSAIDs use as well as potential risks  and complications were explained to the patient and were aknowledged.

## 2014-03-31 NOTE — Assessment & Plan Note (Signed)
Doing well 

## 2014-03-31 NOTE — Assessment & Plan Note (Signed)
See Rx Reduce NSAIDs
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
## 2014-03-31 NOTE — Assessment & Plan Note (Signed)
Chronic  Worse lately - Dr Nelva Bush today - OV at noon

## 2014-05-07 ENCOUNTER — Telehealth: Payer: Self-pay | Admitting: Internal Medicine

## 2014-05-07 MED ORDER — SPIRONOLACTONE 50 MG PO TABS
50.0000 mg | ORAL_TABLET | Freq: Every day | ORAL | Status: DC
Start: 2014-05-07 — End: 2015-07-06

## 2014-05-07 NOTE — Telephone Encounter (Signed)
Notified pt with md response.../lmb 

## 2014-05-07 NOTE — Telephone Encounter (Signed)
She had Dyazide, but it caused low K. I emailed Spironolactone (it may take 3-4 d for it to work) Use Furosemade 40 mg/d for 2-3 d, then go back to 20 mg/d Thx

## 2014-05-07 NOTE — Telephone Encounter (Signed)
Pt states Furosomide is not working still having swollen feet, does not feel this medication is working. Pt wants prev diuretic/bp medicine (she does not remember the name of the med) [facility] [place], pt requests this be sent in today please.

## 2014-06-17 ENCOUNTER — Other Ambulatory Visit: Payer: Self-pay | Admitting: *Deleted

## 2014-06-17 NOTE — Telephone Encounter (Signed)
rf req for Carvedilol 12.5 1 po bid. # [phone] from Dr. Arnoldo Morale. Our med list says 1 qd. Please advise.

## 2014-06-18 ENCOUNTER — Telehealth: Payer: Self-pay | Admitting: Internal Medicine

## 2014-06-18 MED ORDER — CARVEDILOL 12.5 MG PO TABS: 12.5000 mg | ORAL_TABLET | Freq: Two times a day (BID) | ORAL | Status: DC

## 2014-06-18 NOTE — Telephone Encounter (Signed)
Ok Thx 

## 2014-06-18 NOTE — Telephone Encounter (Signed)
Pt called in said that she has a sinus or some type of infection. She said that Dr Camila Li normally calls her something in.  She wanted to see if Dr Camila Li could call her in some cough meds or something without an appt?     Best number [phone]

## 2014-06-19 MED ORDER — AZITHROMYCIN 250 MG PO TABS: ORAL_TABLET | ORAL | Status: DC

## 2014-06-19 NOTE — Telephone Encounter (Signed)
[place]

## 2014-06-19 NOTE — Telephone Encounter (Signed)
md already sent closing phone note...Dawn Howard

## 2014-06-29 ENCOUNTER — Ambulatory Visit: Payer: Medicare Other | Admitting: Internal Medicine

## 2014-07-01 ENCOUNTER — Encounter: Payer: Self-pay | Admitting: Internal Medicine

## 2014-07-01 ENCOUNTER — Telehealth: Payer: Self-pay | Admitting: *Deleted

## 2014-07-01 ENCOUNTER — Ambulatory Visit (INDEPENDENT_AMBULATORY_CARE_PROVIDER_SITE_OTHER): Payer: Medicare Other | Admitting: Internal Medicine

## 2014-07-01 ENCOUNTER — Other Ambulatory Visit (INDEPENDENT_AMBULATORY_CARE_PROVIDER_SITE_OTHER): Payer: Medicare Other

## 2014-07-01 VITALS — BP 120/80 | HR 87 | Temp 98.0°F | Wt 169.0 lb

## 2014-07-01 DIAGNOSIS — I10 Essential (primary) hypertension: Secondary | ICD-10-CM | POA: Diagnosis not present

## 2014-07-01 DIAGNOSIS — M545 Low back pain, unspecified: Secondary | ICD-10-CM

## 2014-07-01 DIAGNOSIS — R519 Headache, unspecified: Secondary | ICD-10-CM

## 2014-07-01 DIAGNOSIS — R51 Headache: Secondary | ICD-10-CM | POA: Diagnosis not present

## 2014-07-01 LAB — BASIC METABOLIC PANEL
BUN: 29 mg/dL — ABNORMAL HIGH (ref 6–23)
CO2: 28 meq/L (ref 19–32)
Calcium: 10.1 mg/dL (ref 8.4–10.5)
Chloride: 104 mEq/L (ref 96–112)
Creatinine, Ser: 0.93 mg/dL (ref 0.40–1.20)
GFR: 64.12 mL/min (ref >60.00)
GLUCOSE: 95 mg/dL (ref 70–99)
POTASSIUM: 3.9 meq/L (ref 3.5–5.1)
SODIUM: 138 meq/L (ref 135–145)

## 2014-07-01 MED ORDER — IBUPROFEN 600 MG PO TABS: 600.0000 mg | ORAL_TABLET | Freq: Three times a day (TID) | ORAL | Status: DC | PRN
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
## 2014-07-01 NOTE — Assessment & Plan Note (Signed)
Doing well. Cont Spironolactone, Coreg

## 2014-07-01 NOTE — Telephone Encounter (Signed)
Pt forgot to ask for Rf on Ibuprofen 800 mg. She states what she has currently been taking does not work as well. Please advise

## 2014-07-01 NOTE — Telephone Encounter (Signed)
I emailed 600 mg - se Rx Thx

## 2014-07-01 NOTE — Assessment & Plan Note (Signed)
Ibuprofen 600 mg tid prn  Stretch

## 2014-07-01 NOTE — Progress Notes (Signed)
Pre visit review using our clinic review tool, if applicable. No additional management support is needed unless otherwise documented below in the visit note. 

## 2014-07-01 NOTE — Assessment & Plan Note (Signed)
D/c Naproxen Ibuprofen prn

## 2014-07-02 DIAGNOSIS — G894 Chronic pain syndrome: Secondary | ICD-10-CM | POA: Diagnosis not present

## 2014-07-02 DIAGNOSIS — M5032 Other cervical disc degeneration, mid-cervical region: Secondary | ICD-10-CM | POA: Diagnosis not present

## 2014-07-02 DIAGNOSIS — M5136 Other intervertebral disc degeneration, lumbar region: Secondary | ICD-10-CM | POA: Diagnosis not present

## 2014-07-17 ENCOUNTER — Other Ambulatory Visit: Payer: Self-pay | Admitting: Internal Medicine

## 2014-07-23 ENCOUNTER — Other Ambulatory Visit: Payer: Self-pay | Admitting: Internal Medicine

## 2014-07-27 NOTE — Telephone Encounter (Signed)
Called refill into [facility] spoke with [place]/pharmacist gave md approval.../lmb

## 2014-08-14 DIAGNOSIS — D3131 Benign neoplasm of right choroid: Secondary | ICD-10-CM | POA: Diagnosis not present

## 2014-08-14 DIAGNOSIS — H25013 Cortical age-related cataract, bilateral: Secondary | ICD-10-CM | POA: Diagnosis not present

## 2014-09-18 ENCOUNTER — Other Ambulatory Visit: Payer: Self-pay | Admitting: Internal Medicine

## 2014-09-24 DIAGNOSIS — G894 Chronic pain syndrome: Secondary | ICD-10-CM | POA: Diagnosis not present

## 2014-09-24 DIAGNOSIS — M5032 Other cervical disc degeneration, mid-cervical region: Secondary | ICD-10-CM | POA: Diagnosis not present

## 2014-09-24 DIAGNOSIS — M5136 Other intervertebral disc degeneration, lumbar region: Secondary | ICD-10-CM | POA: Diagnosis not present

## 2014-09-24 DIAGNOSIS — Z79891 Long term (current) use of opiate analgesic: Secondary | ICD-10-CM | POA: Diagnosis not present

## 2014-11-03 ENCOUNTER — Ambulatory Visit (INDEPENDENT_AMBULATORY_CARE_PROVIDER_SITE_OTHER): Payer: Medicare Other | Admitting: Internal Medicine

## 2014-11-03 ENCOUNTER — Other Ambulatory Visit (INDEPENDENT_AMBULATORY_CARE_PROVIDER_SITE_OTHER): Payer: Medicare Other

## 2014-11-03 ENCOUNTER — Encounter: Payer: Self-pay | Admitting: Internal Medicine

## 2014-11-03 VITALS — BP 139/90 | HR 86 | Wt 170.0 lb

## 2014-11-03 DIAGNOSIS — E559 Vitamin D deficiency, unspecified: Secondary | ICD-10-CM

## 2014-11-03 DIAGNOSIS — R609 Edema, unspecified: Secondary | ICD-10-CM | POA: Diagnosis not present

## 2014-11-03 DIAGNOSIS — M150 Primary generalized (osteo)arthritis: Secondary | ICD-10-CM

## 2014-11-03 DIAGNOSIS — M159 Polyosteoarthritis, unspecified: Secondary | ICD-10-CM

## 2014-11-03 DIAGNOSIS — I10 Essential (primary) hypertension: Secondary | ICD-10-CM

## 2014-11-03 DIAGNOSIS — M5440 Lumbago with sciatica, unspecified side: Secondary | ICD-10-CM | POA: Diagnosis not present

## 2014-11-03 DIAGNOSIS — K219 Gastro-esophageal reflux disease without esophagitis: Secondary | ICD-10-CM

## 2014-11-03 LAB — SEDIMENTATION RATE: SED RATE: 11 mm/h (ref 0–22)

## 2014-11-03 LAB — URIC ACID: URIC ACID, SERUM: 7.9 mg/dL — AB (ref 2.4–7.0)

## 2014-11-03 LAB — BASIC METABOLIC PANEL
BUN: 24 mg/dL — AB (ref 6–23)
CHLORIDE: 106 meq/L (ref 96–112)
CO2: 25 mEq/L (ref 19–32)
CREATININE: 0.84 mg/dL (ref 0.40–1.20)
Calcium: 10 mg/dL (ref 8.4–10.5)
GFR: 72.04 mL/min (ref >60.00)
GLUCOSE: 90 mg/dL (ref 70–99)
POTASSIUM: 3.8 meq/L (ref 3.5–5.1)
Sodium: 140 mEq/L (ref 135–145)

## 2014-11-03 LAB — VITAMIN D 25 HYDROXY (VIT D DEFICIENCY, FRACTURES): VITD: 9.32 ng/mL — AB (ref 30.00–100.00)

## 2014-11-03 LAB — RHEUMATOID FACTOR: Rhuematoid fact SerPl-aCnc: <10 IU/mL (ref <=14)

## 2014-11-03 MED ORDER — VITAMIN D 1000 UNITS PO TABS: 1000.0000 [IU] | ORAL_TABLET | Freq: Every day | ORAL | Status: DC

## 2014-11-03 NOTE — Assessment & Plan Note (Signed)
Omeprazole

## 2014-11-03 NOTE — Assessment & Plan Note (Addendum)
Furosemide prn,  Spironolactone Loose wt

## 2014-11-03 NOTE — Assessment & Plan Note (Signed)
Ibuprofen Vit D Norco prn  Potential benefits of a long term opioids use as well as potential risks (i.e. addiction risk, apnea etc) and complications (i.e. Somnolence, constipation and others) were explained to the patient and were aknowledged.

## 2014-11-03 NOTE — Assessment & Plan Note (Signed)
On Coreg, Furosemide, Spironolactone

## 2014-11-03 NOTE — Assessment & Plan Note (Signed)
Ibuprofen Norco prn  Potential benefits of a long term opioids use as well as potential risks (i.e. addiction risk, apnea etc) and complications (i.e. Somnolence, constipation and others) were explained to the patient and were aknowledged.

## 2014-11-03 NOTE — Progress Notes (Signed)
Pre visit review using our clinic review tool, if applicable. No additional management support is needed unless otherwise documented below in the visit note. 
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
## 2014-11-04 ENCOUNTER — Other Ambulatory Visit: Payer: Self-pay | Admitting: Internal Medicine

## 2014-11-04 MED ORDER — VITAMIN D 1000 UNITS PO TABS: 2000.0000 [IU] | ORAL_TABLET | Freq: Every day | ORAL | Status: AC

## 2014-11-04 MED ORDER — ERGOCALCIFEROL 1.25 MG (50000 UT) PO CAPS: 50000.0000 [IU] | ORAL_CAPSULE | ORAL | Status: DC

## 2014-11-05 ENCOUNTER — Telehealth: Payer: Self-pay | Admitting: Internal Medicine

## 2014-11-05 NOTE — Telephone Encounter (Signed)
We may need to address it later. Cut back on red meat, processed or fried food. Use OTC Nexium daily for GERD Thx!

## 2014-11-05 NOTE — Telephone Encounter (Signed)
Notes Recorded by Cassandria Anger, MD on 11/04/2014 at 9:32 PM Dawn Howard, please, inform patient that all labs are normal except for elevated gout test. You have a low Vitamin D levels - please, start Vit D prescription 50000 iu weekly (Rx emailed to your pharmacy) followed by over-the-counter Vit D 2000 iu daily. Thx

## 2014-11-05 NOTE — Telephone Encounter (Signed)
Is requesting call back with lab results. Patient is going out of town and would like to know before she goes in case she has to pick up any meds before hand.

## 2014-11-05 NOTE — Telephone Encounter (Signed)
Pt informed of below. Pt wants to know if she needs to do anything about elevated gout test. Pt c/o extreme heartburn anything OTC advise?

## 2014-11-06 NOTE — Telephone Encounter (Signed)
Pt. Informed. Picked up Meds at Pharmacy and will pick up OTC Vitamin D and start back on Nexium following diet recommendations

## 2014-12-31 DIAGNOSIS — M5032 Other cervical disc degeneration, mid-cervical region: Secondary | ICD-10-CM | POA: Diagnosis not present

## 2014-12-31 DIAGNOSIS — G894 Chronic pain syndrome: Secondary | ICD-10-CM | POA: Diagnosis not present

## 2014-12-31 DIAGNOSIS — Z79891 Long term (current) use of opiate analgesic: Secondary | ICD-10-CM | POA: Diagnosis not present

## 2014-12-31 DIAGNOSIS — M5136 Other intervertebral disc degeneration, lumbar region: Secondary | ICD-10-CM | POA: Diagnosis not present

## 2015-01-08 DIAGNOSIS — G894 Chronic pain syndrome: Secondary | ICD-10-CM | POA: Diagnosis not present

## 2015-01-08 DIAGNOSIS — M5030 Other cervical disc degeneration, unspecified cervical region: Secondary | ICD-10-CM | POA: Diagnosis not present

## 2015-01-25 DIAGNOSIS — H00021 Hordeolum internum right upper eyelid: Secondary | ICD-10-CM | POA: Diagnosis not present

## 2015-02-03 ENCOUNTER — Encounter: Payer: Self-pay | Admitting: Internal Medicine

## 2015-02-03 ENCOUNTER — Ambulatory Visit (INDEPENDENT_AMBULATORY_CARE_PROVIDER_SITE_OTHER): Payer: Medicare Other | Admitting: Internal Medicine

## 2015-02-03 VITALS — BP 139/84 | HR 79 | Wt 169.0 lb

## 2015-02-03 DIAGNOSIS — Z23 Encounter for immunization: Secondary | ICD-10-CM | POA: Diagnosis not present

## 2015-02-03 DIAGNOSIS — Z0000 Encounter for general adult medical examination without abnormal findings: Secondary | ICD-10-CM

## 2015-02-03 DIAGNOSIS — J4520 Mild intermittent asthma, uncomplicated: Secondary | ICD-10-CM | POA: Diagnosis not present

## 2015-02-03 DIAGNOSIS — E559 Vitamin D deficiency, unspecified: Secondary | ICD-10-CM

## 2015-02-03 DIAGNOSIS — I10 Essential (primary) hypertension: Secondary | ICD-10-CM | POA: Diagnosis not present

## 2015-02-03 DIAGNOSIS — M5440 Lumbago with sciatica, unspecified side: Secondary | ICD-10-CM

## 2015-02-03 MED ORDER — ALPRAZOLAM 0.25 MG PO TABS: 0.2500 mg | ORAL_TABLET | Freq: Two times a day (BID) | ORAL | Status: DC

## 2015-02-03 MED ORDER — FUROSEMIDE 20 MG PO TABS: 20.0000 mg | ORAL_TABLET | Freq: Every day | ORAL | Status: DC | PRN

## 2015-02-03 NOTE — Progress Notes (Signed)
Subjective:  Patient ID: Dawn Howard, female    DOB: [birth year]  Age: 63 y.o. MRN: [id number]  CC: No chief complaint on file.   HPI Dawn Howard presents for HTN, anxiety, vit D def f/u  Outpatient Prescriptions Prior to Visit  Medication Sig Dispense Refill  . carvedilol (COREG) 12.5 MG tablet Take 1 tablet (12.5 mg total) by mouth 2 (two) times daily with a meal. 60 tablet 11  . cholecalciferol (VITAMIN D) 1000 UNITS tablet Take 2 tablets (2,000 Units total) by mouth daily. 100 tablet 3  . esomeprazole (NEXIUM) 20 MG capsule Take 20 mg by mouth daily at 12 noon.    Marland Kitchen HYDROcodone-ibuprofen (VICOPROFEN) 7.5-200 MG per tablet Take 1 tablet by mouth 2 (two) times daily as needed.  0  . ibuprofen (ADVIL,MOTRIN) 600 MG tablet Take 1 tablet (600 mg total) by mouth every 8 (eight) hours as needed for headache or moderate pain. 60 tablet 3  . Multiple Vitamin (MULITIVITAMIN WITH MINERALS) TABS Take 1 tablet by mouth daily.    . Probiotic Product (ALIGN) 4 MG CAPS Take 1 capsule (4 mg total) by mouth daily. 30 capsule 0  . ALPRAZolam (XANAX) 0.25 MG tablet TAKE 1 TABLET BY MOUTH TWICE DAILY 60 tablet 3  . ergocalciferol (VITAMIN D2) 50000 UNITS capsule Take 1 capsule (50,000 Units total) by mouth once a week. 6 capsule 0  . furosemide (LASIX) 20 MG tablet TAKE 1 TABLET BY MOUTH EVERY DAY AS NEEDED FOR EDEMA 30 tablet 5  . spironolactone (ALDACTONE) 50 MG tablet Take 1 tablet (50 mg total) by mouth daily. (Patient not taking: Reported on 02/03/2015) 30 tablet 11   No facility-administered medications prior to visit.    ROS Review of Systems  Constitutional: Negative for chills, activity change, appetite change, fatigue and unexpected weight change.  HENT: Negative for congestion, mouth sores and sinus pressure.   Eyes: Negative for visual disturbance.  Respiratory: Negative for cough and chest tightness.   Gastrointestinal: Negative for nausea and abdominal pain.  Genitourinary:  Negative for frequency, difficulty urinating and vaginal pain.  Musculoskeletal: Negative for back pain and gait problem.  Skin: Negative for pallor and rash.  Neurological: Negative for dizziness, tremors, weakness, numbness and headaches.  Psychiatric/Behavioral: Negative for confusion and sleep disturbance. The patient is not nervous/anxious.     Objective:  BP 139/84 mmHg  Pulse 79  Wt 169 lb (76.658 kg)  SpO2 98%  BP Readings from Last 3 Encounters:  02/03/15 139/84  11/03/14 139/90  07/01/14 120/80    Wt Readings from Last 3 Encounters:  02/03/15 169 lb (76.658 kg)  11/03/14 170 lb (77.111 kg)  07/01/14 169 lb (76.658 kg)    Physical Exam  Constitutional: She appears well-developed. No distress.  HENT:  Head: Normocephalic.  Right Ear: External ear normal.  Left Ear: External ear normal.  Nose: Nose normal.  Mouth/Throat: Oropharynx is clear and moist.  Eyes: Conjunctivae are normal. Pupils are equal, round, and reactive to light. Right eye exhibits no discharge. Left eye exhibits no discharge.  Neck: Normal range of motion. Neck supple. No JVD present. No tracheal deviation present. No thyromegaly present.  Cardiovascular: Normal rate, regular rhythm and normal heart sounds.   Pulmonary/Chest: No stridor. No respiratory distress. She has no wheezes.  Abdominal: Soft. Bowel sounds are normal. She exhibits no distension and no mass. There is no tenderness. There is no rebound and no guarding.  Musculoskeletal: She exhibits no edema or tenderness.  Lymphadenopathy:  She has no cervical adenopathy.  Neurological: She displays normal reflexes. No cranial nerve deficit. She exhibits normal muscle tone. Coordination normal.  Skin: No rash noted. No erythema.  Psychiatric: She has a normal mood and affect. Her behavior is normal. Judgment and thought content normal.    Lab Results  Component Value Date   WBC 7.7 02/19/2014   HGB 11.0* 02/19/2014   HCT 31.5*  02/19/2014   PLT 229 02/19/2014   GLUCOSE 90 11/03/2014   CHOL 147 12/25/2013   TRIG 152.0* 12/25/2013   HDL 47.10 12/25/2013   LDLDIRECT 145.3 01/27/2013   LDLCALC 70 12/25/2013   ALT 39* 02/19/2014   AST 28 02/19/2014   NA 140 11/03/2014   K 3.8 11/03/2014   CL 106 11/03/2014   CREATININE 0.84 11/03/2014   BUN 24* 11/03/2014   CO2 25 11/03/2014   TSH 1.54 12/25/2013    US Abdomen Complete  02/18/2014   CLINICAL DATA:  Nausea and vomiting. Gastritis. Urinary tract infection. Elevated liver enzymes.  EXAM: ULTRASOUND ABDOMEN COMPLETE  COMPARISON:  None.  FINDINGS: Gallbladder: No gallstones or wall thickening visualized. No sonographic Murphy sign noted. Wall thickness is within normal limits at 1.3 mm.  Common bile duct: Diameter: 2.5 mm, within normal limits.  Liver: A benign appearing cyst is noted within the right lobe of the liver, measuring 2.9 cm maximally.  IVC: No abnormality visualized.  Pancreas: Visualized portion unremarkable.  Spleen: The spleen is of normal size and echotexture measuring 8.7 cm, within normal limits.  Right Kidney: Length: 13.1 cm, within normal limits. Echogenicity within normal limits. No mass or hydronephrosis visualized.  Left Kidney: Length: 10.8 cm, within normal limits. Echogenicity within normal limits. No mass or hydronephrosis visualized.  Abdominal aorta: No aneurysm visualized.  Other findings: None.  IMPRESSION: 1. No acute or focal lesion to explain the patient's symptoms. 2. Benign appearing cyst in the right lobe of the liver.   Electronically Signed   By: Lawrence Santiago M.D.   On: 02/18/2014 12:26    Assessment & Plan:   Diagnoses and all orders for this visit:  Essential hypertension -     TSH; Future -     Vit D  25 hydroxy (rtn osteoporosis monitoring); Future -     Hepatic function panel; Future -     Basic metabolic panel; Future -     CBC with Differential/Platelet; Future -     Lipid panel; Future -     Urinalysis;  Future  Asthma, mild intermittent, uncomplicated -     TSH; Future -     Vit D  25 hydroxy (rtn osteoporosis monitoring); Future -     Hepatic function panel; Future -     Basic metabolic panel; Future -     CBC with Differential/Platelet; Future -     Lipid panel; Future -     Urinalysis; Future  Midline low back pain with sciatica, sciatica laterality unspecified -     TSH; Future -     Vit D  25 hydroxy (rtn osteoporosis monitoring); Future -     Hepatic function panel; Future -     Basic metabolic panel; Future -     CBC with Differential/Platelet; Future -     Lipid panel; Future -     Urinalysis; Future  Vitamin D deficiency -     TSH; Future -     Vit D  25 hydroxy (rtn osteoporosis monitoring); Future -  Hepatic function panel; Future -     Basic metabolic panel; Future -     CBC with Differential/Platelet; Future -     Lipid panel; Future -     Urinalysis; Future  Well adult exam -     TSH; Future -     Vit D  25 hydroxy (rtn osteoporosis monitoring); Future -     Hepatic function panel; Future -     Basic metabolic panel; Future -     CBC with Differential/Platelet; Future -     Lipid panel; Future -     Urinalysis; Future  Need for influenza vaccination -     Flu Vaccine QUAD 36+ mos IM  Other orders -     ALPRAZolam (XANAX) 0.25 MG tablet; Take 1 tablet (0.25 mg total) by mouth 2 (two) times daily. -     furosemide (LASIX) 20 MG tablet; Take 1 tablet (20 mg total) by mouth daily as needed for edema.  I have discontinued [name]'s ergocalciferol. I have also changed her ALPRAZolam and furosemide. Additionally, I am having her maintain her multivitamin with minerals, ALIGN, esomeprazole, spironolactone, carvedilol, ibuprofen, HYDROcodone-ibuprofen, and cholecalciferol.  Meds ordered this encounter  Medications  . ALPRAZolam (XANAX) 0.25 MG tablet    Sig: Take 1 tablet (0.25 mg total) by mouth 2 (two) times daily.    Dispense:  60 tablet     Refill:  3  . furosemide (LASIX) 20 MG tablet    Sig: Take 1 tablet (20 mg total) by mouth daily as needed for edema.    Dispense:  30 tablet    Refill:  5     Follow-up: Return in about 4 months (around 06/05/2015) for Wellness Exam.  Walker Kehr, MD

## 2015-02-03 NOTE — Assessment & Plan Note (Signed)
On Coreg, Furosemide, Spironolactone 

## 2015-02-03 NOTE — Assessment & Plan Note (Signed)
On Vit D Rx

## 2015-02-03 NOTE — Assessment & Plan Note (Signed)
No relapse 

## 2015-02-03 NOTE — Progress Notes (Signed)
Pre visit review using our clinic review tool, if applicable. No additional management support is needed unless otherwise documented below in the visit note. 

## 2015-02-03 NOTE — Assessment & Plan Note (Signed)
Ibuprofen Norco prn  Potential benefits of a long term opioids use as well as potential risks (i.e. addiction risk, apnea etc) and complications (i.e. Somnolence, constipation and others) were explained to the patient and were aknowledged.  

## 2015-02-25 ENCOUNTER — Telehealth: Payer: Self-pay | Admitting: Internal Medicine

## 2015-02-25 NOTE — Telephone Encounter (Signed)
Patient Name: Dawn Howard DOB: 04-Jul-1950 Initial Comment caller states she is having chest pain on the left side of her chest Nurse Assessment Nurse: Roosvelt Maser, RN, Barnetta Chapel Date/Time (Eastern Time): 02/25/2015 10:54:23 AM Confirm and document reason for call. If symptomatic, describe symptoms. ---caller states in the past hour she has had twinges of pain in her left chest, 4/10 that last a few seconds and then go away. no other symptoms. denies difficulty breathing, diaphoresis, dizziness. Has the patient traveled out of the country within the last 30 days? ---Not Applicable Does the patient have any new or worsening symptoms? ---Yes Will a triage be completed? ---Yes Related visit to physician within the last 2 weeks? ---No Does the PT have any chronic conditions? (i.e. diabetes, asthma, etc.) ---Yes List chronic conditions. ---heart murmur Guidelines Guideline Title Affirmed Question Affirmed Notes Chest Pain [1] Chest pain lasting <= 5 minutes AND [2] NO chest pain or cardiac symptoms now (Exceptions: pains lasting a few seconds) Final Disposition User See Physician within [place], RN, Barnetta Chapel Comments caller states her chances of going to ER/UC are 0% just wants to see dr Alain Marion No appts available with PCP, Schedule appt at 3:30 pm tomorrow 10/21 with Dr. Elna Breslow. Referrals REFERRED TO PCP OFFICE Disagree/Comply: Comply

## 2015-02-26 ENCOUNTER — Encounter: Payer: Self-pay | Admitting: Family

## 2015-02-26 ENCOUNTER — Other Ambulatory Visit (INDEPENDENT_AMBULATORY_CARE_PROVIDER_SITE_OTHER): Payer: Medicare Other

## 2015-02-26 ENCOUNTER — Ambulatory Visit (INDEPENDENT_AMBULATORY_CARE_PROVIDER_SITE_OTHER): Payer: Medicare Other | Admitting: Family

## 2015-02-26 VITALS — BP 120/90 | HR 75 | Temp 98.1°F | Ht 63.0 in | Wt 166.5 lb

## 2015-02-26 DIAGNOSIS — R0789 Other chest pain: Secondary | ICD-10-CM

## 2015-02-26 DIAGNOSIS — R079 Chest pain, unspecified: Secondary | ICD-10-CM | POA: Insufficient documentation

## 2015-02-26 DIAGNOSIS — R944 Abnormal results of kidney function studies: Secondary | ICD-10-CM

## 2015-02-26 LAB — COMPREHENSIVE METABOLIC PANEL
ALK PHOS: 124 U/L — AB (ref 39–117)
ALT: 20 U/L (ref 0–35)
AST: 15 U/L (ref 0–37)
Albumin: 4.1 g/dL (ref 3.5–5.2)
BILIRUBIN TOTAL: 0.5 mg/dL (ref 0.2–1.2)
BUN: 30 mg/dL — AB (ref 6–23)
CO2: 24 meq/L (ref 19–32)
Calcium: 10.2 mg/dL (ref 8.4–10.5)
Chloride: 104 mEq/L (ref 96–112)
Creatinine, Ser: 1.18 mg/dL (ref 0.40–1.20)
GFR: 48.62 mL/min — ABNORMAL LOW (ref >60.00)
GLUCOSE: 99 mg/dL (ref 70–99)
POTASSIUM: 4.2 meq/L (ref 3.5–5.1)
SODIUM: 136 meq/L (ref 135–145)
TOTAL PROTEIN: 7.3 g/dL (ref 6.0–8.3)

## 2015-02-26 LAB — CBC
HCT: 44.3 % (ref 36.0–46.0)
Hemoglobin: 14.9 g/dL (ref 12.0–15.0)
MCHC: 33.6 g/dL (ref 30.0–36.0)
MCV: 88.9 fl (ref 78.0–100.0)
Platelets: 250 10*3/uL (ref 150.0–400.0)
RBC: 4.99 Mil/uL (ref 3.87–5.11)
RDW: 13.2 % (ref 11.5–15.5)
WBC: 7.8 10*3/uL (ref 4.0–10.5)

## 2015-02-26 LAB — TROPONIN I: TNIDX: 0 ug/L (ref 0.00–0.06)

## 2015-02-26 NOTE — Patient Instructions (Signed)
Thank you for choosing [facility].  Summary/Instructions:  Please stop by the lab on the basement level of the building for your blood work. Your results will be released to [place] (or called to you) after review, usually within 72 hours after test completion. If any changes need to be made, you will be notified at that same time.  If your symptoms worsen or fail to improve, please contact our office for further instruction, or in case of emergency go directly to the emergency room at the closest medical facility.    Nonspecific Chest Pain  Chest pain can be caused by many different conditions. There is always a chance that your pain could be related to something serious, such as a heart attack or a blood clot in your lungs. Chest pain can also be caused by conditions that are not life-threatening. If you have chest pain, it is very important to follow up with your health care provider. CAUSES  Chest pain can be caused by:  Heartburn.  Pneumonia or bronchitis.  Anxiety or stress.  Inflammation around your heart (pericarditis) or lung (pleuritis or pleurisy).  A blood clot in your lung.  A collapsed lung (pneumothorax). It can develop suddenly on its own (spontaneous pneumothorax) or from trauma to the chest.  Shingles infection (varicella-zoster virus).  Heart attack.  Damage to the bones, muscles, and cartilage that make up your chest wall. This can include:  Bruised bones due to injury.  Strained muscles or cartilage due to frequent or repeated coughing or overwork.  Fracture to one or more ribs.  Sore cartilage due to inflammation (costochondritis). RISK FACTORS  Risk factors for chest pain may include:  Activities that increase your risk for trauma or injury to your chest.  Respiratory infections or conditions that cause frequent coughing.  Medical conditions or overeating that can cause heartburn.  Heart disease or family history of heart  disease.  Conditions or health behaviors that increase your risk of developing a blood clot.  Having had chicken pox (varicella zoster). SIGNS AND SYMPTOMS Chest pain can feel like:  Burning or tingling on the surface of your chest or deep in your chest.  Crushing, pressure, aching, or squeezing pain.  Dull or sharp pain that is worse when you move, cough, or take a deep breath.  Pain that is also felt in your back, neck, shoulder, or arm, or pain that spreads to any of these areas. Your chest pain may come and go, or it may stay constant. DIAGNOSIS Lab tests or other studies may be needed to find the cause of your pain. Your health care provider may have you take a test called an ambulatory ECG (electrocardiogram). An ECG records your heartbeat patterns at the time the test is performed. You may also have other tests, such as:  Transthoracic echocardiogram (TTE). During echocardiography, sound waves are used to create a picture of all of the heart structures and to look at how blood flows through your heart.  Transesophageal echocardiogram (TEE).This is a more advanced imaging test that obtains images from inside your body. It allows your health care provider to see your heart in finer detail.  Cardiac monitoring. This allows your health care provider to monitor your heart rate and rhythm in real time.  Holter monitor. This is a portable device that records your heartbeat and can help to diagnose abnormal heartbeats. It allows your health care provider to track your heart activity for several days, if needed.  Stress tests. These  can be done through exercise or by taking medicine that makes your heart beat more quickly.  Blood tests.  Imaging tests. TREATMENT  Your treatment depends on what is causing your chest pain. Treatment may include:  Medicines. These may include:  Acid blockers for heartburn.  Anti-inflammatory medicine.  Pain medicine for inflammatory  conditions.  Antibiotic medicine, if an infection is present.  Medicines to dissolve blood clots.  Medicines to treat coronary artery disease.  Supportive care for conditions that do not require medicines. This may include:  Resting.  Applying heat or cold packs to injured areas.  Limiting activities until pain decreases. HOME CARE INSTRUCTIONS  If you were prescribed an antibiotic medicine, finish it all even if you start to feel better.  Avoid any activities that bring on chest pain.  Do not use any tobacco products, including cigarettes, chewing tobacco, or electronic cigarettes. If you need help quitting, ask your health care provider.  Do not drink alcohol.  Take medicines only as directed by your health care provider.  Keep all follow-up visits as directed by your health care provider. This is important. This includes any further testing if your chest pain does not go away.  If heartburn is the cause for your chest pain, you may be told to keep your head raised (elevated) while sleeping. This reduces the chance that acid will go from your stomach into your esophagus.  Make lifestyle changes as directed by your health care provider. These may include:  Getting regular exercise. Ask your health care provider to suggest some activities that are safe for you.  Eating a heart-healthy diet. A registered dietitian can help you to learn healthy eating options.  Maintaining a healthy weight.  Managing diabetes, if necessary.  Reducing stress. SEEK MEDICAL CARE IF:  Your chest pain does not go away after treatment.  You have a rash with blisters on your chest.  You have a fever. SEEK IMMEDIATE MEDICAL CARE IF:   Your chest pain is worse.  You have an increasing cough, or you cough up blood.  You have severe abdominal pain.  You have severe weakness.  You faint.  You have chills.  You have sudden, unexplained chest discomfort.  You have sudden, unexplained  discomfort in your arms, back, neck, or jaw.  You have shortness of breath at any time.  You suddenly start to sweat, or your skin gets clammy.  You feel nauseous or you vomit.  You suddenly feel light-headed or dizzy.  Your heart begins to beat quickly, or it feels like it is skipping beats. These symptoms may represent a serious problem that is an emergency. Do not wait to see if the symptoms will go away. Get medical help right away. Call your local emergency services (911 in the [place] Do not drive yourself to the hospital.   This information is not intended to replace advice given to you by your health care provider. Make sure you discuss any questions you have with your health care provider.   Document Released: 02/01/2005 Document Revised: 05/15/2014 Document Reviewed: 11/28/2013 Elsevier Interactive Patient Education [employer].

## 2015-02-26 NOTE — Assessment & Plan Note (Addendum)
Symptoms of atypical transient chest pain of questionable origin. EKG remains abnormal but comparable to previous tracings and no evidence of ACS. Given resolution of symptoms, obtain Tropinin, CMET and CBC. Unlikely musculoskeletal, although cannot rule out possible anxiety. Follow up pending lab work or if symptoms return. Consider possible 2D echo or cardiology referral.

## 2015-02-26 NOTE — Progress Notes (Signed)
Subjective:    Patient ID: Dawn Howard, female    DOB: 19-May-1950, 64 y.o.   MRN: [id number]  Chief Complaint  Patient presents with  . Chest Pain    HPI:  Dawn Howard is a 64 y.o. female who  has a past medical history of GERD (gastroesophageal reflux disease); Hypertension; Prolonged Q-T interval on ECG; Syncope; Asthma; Low back pain; and Arthritis. and presents today for an acute office visit.   1.) Chest pain - Associated symptom of pain located in her chest started yesterday and was noted to be "a pain" that was not extremely sharp but was noticeable. It would last 2-3 seconds at a time and then gradually began to wear off. Denies diaphoresis, jaw pain or left arm pain. Denies paraoxsymal nocturnal dyspnea. Does have occasional reflux. Denies pain with exertion and stayed about the same the entire the entire time. Not currently experiencing symptoms at present.  Allergies  Allergen Reactions  . Acetaminophen Hives    Break out  . Codeine   . Diazepam   . Diphenhydramine Hcl   . Dyazide [Hydrochlorothiazide W-Triamterene]     Low K  . Penicillins     rash     Current Outpatient Prescriptions on File Prior to Visit  Medication Sig Dispense Refill  . ALPRAZolam (XANAX) 0.25 MG tablet Take 1 tablet (0.25 mg total) by mouth 2 (two) times daily. 60 tablet 3  . carvedilol (COREG) 12.5 MG tablet Take 1 tablet (12.5 mg total) by mouth 2 (two) times daily with a meal. 60 tablet 11  . cholecalciferol (VITAMIN D) 1000 UNITS tablet Take 2 tablets (2,000 Units total) by mouth daily. 100 tablet 3  . furosemide (LASIX) 20 MG tablet Take 1 tablet (20 mg total) by mouth daily as needed for edema. 30 tablet 5  . HYDROcodone-ibuprofen (VICOPROFEN) 7.5-200 MG per tablet Take 1 tablet by mouth 2 (two) times daily as needed.  0  . ibuprofen (ADVIL,MOTRIN) 600 MG tablet Take 1 tablet (600 mg total) by mouth every 8 (eight) hours as needed for headache or moderate pain. 60 tablet 3  .  Multiple Vitamin (MULITIVITAMIN WITH MINERALS) TABS Take 1 tablet by mouth daily.    Marland Kitchen spironolactone (ALDACTONE) 50 MG tablet Take 1 tablet (50 mg total) by mouth daily. 30 tablet 11   No current facility-administered medications on file prior to visit.   Past Medical History  Diagnosis Date  . GERD (gastroesophageal reflux disease)   . Hypertension   . 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 Syncope   . Asthma   . Low back pain   . Arthritis     Review of Systems  Constitutional: Negative for diaphoresis and fatigue.  Cardiovascular: Positive for chest pain. Negative for palpitations and leg swelling.      Objective:    BP 120/90 mmHg  Pulse 75  Temp(Src) 98.1 F (36.7 C) (Oral)  Ht 5\' 3"  (1.6 m)  Wt 166 lb 8 oz (75.524 kg)  BMI 29.50 kg/m2  SpO2 97% Nursing note and vital signs reviewed.   Physical Exam  Constitutional: She is oriented to person, place, and time. She appears well-developed and well-nourished. No distress.  Cardiovascular: Normal rate, regular rhythm, normal heart sounds and intact distal pulses.  Exam reveals no gallop and no friction rub.   No murmur heard. There is moderate, non-pitting edema which is chronic and unchanged.   Pulmonary/Chest: Effort normal and breath sounds normal. She has no wheezes. She  has no rales. She exhibits no tenderness.  Abdominal: Soft. Normal appearance and bowel sounds are normal. There is no hepatosplenomegaly. There is no tenderness. There is no rigidity, no rebound, no guarding, no tenderness at McBurney's point and negative Murphy's sign.  Neurological: She is alert and oriented to person, place, and time.  Skin: Skin is warm and dry.  Psychiatric: She has a normal mood and affect. Her behavior is normal. Judgment and thought content normal.    Previous and current EKG's were individually interpreted by this provider.     Assessment & Plan:   Problem List Items Addressed This Visit      Other   Chest pain - Primary      Symptoms of atypical transient chest pain of questionable origin. EKG remains abnormal but comparable to previous tracings and no evidence of ACS. Given resolution of symptoms, obtain Tropinin, CMET and CBC. Unlikely musculoskeletal, although cannot rule out possible anxiety. Follow up pending lab work or if symptoms return. Consider possible 2D echo or cardiology referral.       Relevant Orders   EKG 12-Lead (Completed)   Troponin I   CBC   Comprehensive metabolic panel

## 2015-02-26 NOTE — Progress Notes (Signed)
Pre visit review using our clinic review tool, if applicable. No additional management support is needed unless otherwise documented below in the visit note. 

## 2015-02-28 ENCOUNTER — Encounter: Payer: Self-pay | Admitting: Family

## 2015-03-08 ENCOUNTER — Other Ambulatory Visit (INDEPENDENT_AMBULATORY_CARE_PROVIDER_SITE_OTHER): Payer: Medicare Other

## 2015-03-08 ENCOUNTER — Encounter: Payer: Self-pay | Admitting: Family

## 2015-03-08 DIAGNOSIS — R944 Abnormal results of kidney function studies: Secondary | ICD-10-CM | POA: Diagnosis not present

## 2015-03-08 LAB — BASIC METABOLIC PANEL
BUN: 36 mg/dL — AB (ref 6–23)
CO2: 20 mEq/L (ref 19–32)
Calcium: 10 mg/dL (ref 8.4–10.5)
Chloride: 105 mEq/L (ref 96–112)
Creatinine, Ser: 1.16 mg/dL (ref 0.40–1.20)
GFR: 49.59 mL/min — AB (ref >60.00)
Glucose, Bld: 87 mg/dL (ref 70–99)
POTASSIUM: 4.1 meq/L (ref 3.5–5.1)
SODIUM: 137 meq/L (ref 135–145)

## 2015-03-27 DIAGNOSIS — M50322 Other cervical disc degeneration at C5-C6 level: Secondary | ICD-10-CM | POA: Diagnosis not present

## 2015-03-27 DIAGNOSIS — Z79891 Long term (current) use of opiate analgesic: Secondary | ICD-10-CM | POA: Diagnosis not present

## 2015-03-27 DIAGNOSIS — M5136 Other intervertebral disc degeneration, lumbar region: Secondary | ICD-10-CM | POA: Diagnosis not present

## 2015-03-27 DIAGNOSIS — G894 Chronic pain syndrome: Secondary | ICD-10-CM | POA: Diagnosis not present

## 2015-06-08 ENCOUNTER — Encounter: Payer: Self-pay | Admitting: Internal Medicine

## 2015-06-08 ENCOUNTER — Other Ambulatory Visit (INDEPENDENT_AMBULATORY_CARE_PROVIDER_SITE_OTHER): Payer: Medicare Other

## 2015-06-08 ENCOUNTER — Ambulatory Visit (INDEPENDENT_AMBULATORY_CARE_PROVIDER_SITE_OTHER): Payer: Medicare Other | Admitting: Internal Medicine

## 2015-06-08 VITALS — BP 130/80 | HR 98 | Wt 164.0 lb

## 2015-06-08 DIAGNOSIS — K047 Periapical abscess without sinus: Secondary | ICD-10-CM

## 2015-06-08 DIAGNOSIS — E559 Vitamin D deficiency, unspecified: Secondary | ICD-10-CM

## 2015-06-08 DIAGNOSIS — J4520 Mild intermittent asthma, uncomplicated: Secondary | ICD-10-CM | POA: Diagnosis not present

## 2015-06-08 DIAGNOSIS — I10 Essential (primary) hypertension: Secondary | ICD-10-CM

## 2015-06-08 DIAGNOSIS — M5440 Lumbago with sciatica, unspecified side: Secondary | ICD-10-CM

## 2015-06-08 DIAGNOSIS — Z0000 Encounter for general adult medical examination without abnormal findings: Secondary | ICD-10-CM

## 2015-06-08 LAB — CBC WITH DIFFERENTIAL/PLATELET
BASOS ABS: 0 10*3/uL (ref 0.0–0.1)
Basophils Relative: 0.4 % (ref 0.0–3.0)
EOS ABS: 0.2 10*3/uL (ref 0.0–0.7)
Eosinophils Relative: 2.1 % (ref 0.0–5.0)
HEMATOCRIT: 46.2 % — AB (ref 36.0–46.0)
Hemoglobin: 15.6 g/dL — ABNORMAL HIGH (ref 12.0–15.0)
LYMPHS ABS: 1.6 10*3/uL (ref 0.7–4.0)
LYMPHS PCT: 14.9 % (ref 12.0–46.0)
MCHC: 33.7 g/dL (ref 30.0–36.0)
MCV: 89.5 fl (ref 78.0–100.0)
Monocytes Absolute: 0.8 10*3/uL (ref 0.1–1.0)
Monocytes Relative: 7 % (ref 3.0–12.0)
NEUTROS ABS: 8.3 10*3/uL — AB (ref 1.4–7.7)
NEUTROS PCT: 75.6 % (ref 43.0–77.0)
PLATELETS: 283 10*3/uL (ref 150.0–400.0)
RBC: 5.16 Mil/uL — ABNORMAL HIGH (ref 3.87–5.11)
RDW: 12.8 % (ref 11.5–15.5)
WBC: 10.9 10*3/uL — ABNORMAL HIGH (ref 4.0–10.5)

## 2015-06-08 LAB — TSH: TSH: 1.71 u[IU]/mL (ref 0.35–4.50)

## 2015-06-08 LAB — URINALYSIS, ROUTINE W REFLEX MICROSCOPIC
Bilirubin Urine: NEGATIVE
KETONES UR: NEGATIVE
LEUKOCYTES UA: NEGATIVE
Nitrite: NEGATIVE
PH: 6 (ref 5.0–8.0)
SPECIFIC GRAVITY, URINE: 1.02 (ref 1.000–1.030)
Total Protein, Urine: NEGATIVE
URINE GLUCOSE: NEGATIVE
UROBILINOGEN UA: 0.2 (ref 0.0–1.0)

## 2015-06-08 LAB — LIPID PANEL
Cholesterol: 226 mg/dL — ABNORMAL HIGH (ref 0–200)
HDL: 53.8 mg/dL (ref >39.00)
LDL Cholesterol: 142 mg/dL — ABNORMAL HIGH (ref 0–99)
NonHDL: 172.15
TRIGLYCERIDES: 152 mg/dL — AB (ref 0.0–149.0)
Total CHOL/HDL Ratio: 4
VLDL: 30.4 mg/dL (ref 0.0–40.0)

## 2015-06-08 LAB — VITAMIN D 25 HYDROXY (VIT D DEFICIENCY, FRACTURES): VITD: 27.79 ng/mL — ABNORMAL LOW (ref 30.00–100.00)

## 2015-06-08 LAB — BASIC METABOLIC PANEL
BUN: 31 mg/dL — AB (ref 6–23)
CALCIUM: 10.4 mg/dL (ref 8.4–10.5)
CO2: 24 mEq/L (ref 19–32)
CREATININE: 0.93 mg/dL (ref 0.40–1.20)
Chloride: 105 mEq/L (ref 96–112)
GFR: 63.94 mL/min (ref >60.00)
GLUCOSE: 91 mg/dL (ref 70–99)
POTASSIUM: 4.1 meq/L (ref 3.5–5.1)
Sodium: 139 mEq/L (ref 135–145)

## 2015-06-08 LAB — HEPATIC FUNCTION PANEL
ALBUMIN: 4.3 g/dL (ref 3.5–5.2)
ALT: 28 U/L (ref 0–35)
AST: 20 U/L (ref 0–37)
Alkaline Phosphatase: 101 U/L (ref 39–117)
BILIRUBIN DIRECT: 0.1 mg/dL (ref 0.0–0.3)
TOTAL PROTEIN: 7.6 g/dL (ref 6.0–8.3)
Total Bilirubin: 0.7 mg/dL (ref 0.2–1.2)

## 2015-06-08 MED ORDER — DOXYCYCLINE HYCLATE 100 MG PO TABS: 100.0000 mg | ORAL_TABLET | Freq: Two times a day (BID) | ORAL | Status: DC

## 2015-06-08 NOTE — Assessment & Plan Note (Signed)
Vit D 

## 2015-06-08 NOTE — Assessment & Plan Note (Signed)
On Coreg, Spironolactone Labs

## 2015-06-08 NOTE — Progress Notes (Signed)
Pre visit review using our clinic review tool, if applicable. No additional management support is needed unless otherwise documented below in the visit note. 

## 2015-06-08 NOTE — Progress Notes (Signed)
Subjective:  Patient ID: Dawn Howard, female    DOB: [birth year]  Age: 65 y.o. MRN: [id number]  CC: No chief complaint on file.   HPI Prince Nayyar presents for anxiety, HTN f/u. C/o dental abscess developing on the left  Outpatient Prescriptions Prior to Visit  Medication Sig Dispense Refill  . ALPRAZolam (XANAX) 0.25 MG tablet Take 1 tablet (0.25 mg total) by mouth 2 (two) times daily. 60 tablet 3  . carvedilol (COREG) 12.5 MG tablet Take 1 tablet (12.5 mg total) by mouth 2 (two) times daily with a meal. 60 tablet 11  . cholecalciferol (VITAMIN D) 1000 UNITS tablet Take 2 tablets (2,000 Units total) by mouth daily. 100 tablet 3  . furosemide (LASIX) 20 MG tablet Take 1 tablet (20 mg total) by mouth daily as needed for edema. 30 tablet 5  . HYDROcodone-ibuprofen (VICOPROFEN) 7.5-200 MG per tablet Take 1 tablet by mouth 2 (two) times daily as needed.  0  . ibuprofen (ADVIL,MOTRIN) 600 MG tablet Take 1 tablet (600 mg total) by mouth every 8 (eight) hours as needed for headache or moderate pain. 60 tablet 3  . Multiple Vitamin (MULITIVITAMIN WITH MINERALS) TABS Take 1 tablet by mouth daily.    Marland Kitchen spironolactone (ALDACTONE) 50 MG tablet Take 1 tablet (50 mg total) by mouth daily. 30 tablet 11   No facility-administered medications prior to visit.    ROS Review of Systems  Constitutional: Negative for fever, chills, activity change, appetite change, fatigue and unexpected weight change.  HENT: Negative for congestion, mouth sores and sinus pressure.   Eyes: Negative for visual disturbance.  Respiratory: Negative for cough and chest tightness.   Gastrointestinal: Negative for nausea, abdominal pain and diarrhea.  Genitourinary: Negative for frequency, difficulty urinating and vaginal pain.  Musculoskeletal: Negative for back pain and gait problem.  Skin: Negative for pallor and rash.  Neurological: Negative for dizziness, tremors, weakness, numbness and headaches.    Psychiatric/Behavioral: Negative for confusion and sleep disturbance. The patient is not nervous/anxious.     Objective:  BP 148/92 mmHg  Pulse 98  Wt 164 lb (74.39 kg)  SpO2 97%  BP Readings from Last 3 Encounters:  06/08/15 148/92  02/26/15 120/90  02/03/15 139/84    Wt Readings from Last 3 Encounters:  06/08/15 164 lb (74.39 kg)  02/26/15 166 lb 8 oz (75.524 kg)  02/03/15 169 lb (76.658 kg)    Physical Exam  Constitutional: She appears well-developed. No distress.  HENT:  Head: Normocephalic.  Right Ear: External ear normal.  Left Ear: External ear normal.  Nose: Nose normal.  Mouth/Throat: Oropharynx is clear and moist.  Eyes: Conjunctivae are normal. Pupils are equal, round, and reactive to light. Right eye exhibits no discharge. Left eye exhibits no discharge.  Neck: Normal range of motion. Neck supple. No JVD present. No tracheal deviation present. No thyromegaly present.  Cardiovascular: Normal rate, regular rhythm and normal heart sounds.   Pulmonary/Chest: No stridor. No respiratory distress. She has no wheezes.  Abdominal: Soft. Bowel sounds are normal. She exhibits no distension and no mass. There is no tenderness. There is no rebound and no guarding.  Musculoskeletal: She exhibits no edema or tenderness.  Lymphadenopathy:    She has no cervical adenopathy.  Neurological: She displays normal reflexes. No cranial nerve deficit. She exhibits normal muscle tone. Coordination normal.  Skin: No rash noted. There is erythema.  Psychiatric: She has a normal mood and affect. Her behavior is normal. Judgment and thought content normal.  L lower cheek infiltrate  Lab Results  Component Value Date   WBC 7.8 02/26/2015   HGB 14.9 02/26/2015   HCT 44.3 02/26/2015   PLT 250.0 02/26/2015   GLUCOSE 87 03/08/2015   CHOL 147 12/25/2013   TRIG 152.0* 12/25/2013   HDL 47.10 12/25/2013   LDLDIRECT 145.3 01/27/2013   LDLCALC 70 12/25/2013   ALT 20 02/26/2015   AST 15  02/26/2015   NA 137 03/08/2015   K 4.1 03/08/2015   CL 105 03/08/2015   CREATININE 1.16 03/08/2015   BUN 36* 03/08/2015   CO2 20 03/08/2015   TSH 1.54 12/25/2013    US Abdomen Complete  02/18/2014  CLINICAL DATA:  Nausea and vomiting. Gastritis. Urinary tract infection. Elevated liver enzymes. EXAM: ULTRASOUND ABDOMEN COMPLETE COMPARISON:  None. FINDINGS: Gallbladder: No gallstones or wall thickening visualized. No sonographic Murphy sign noted. Wall thickness is within normal limits at 1.3 mm. Common bile duct: Diameter: 2.5 mm, within normal limits. Liver: A benign appearing cyst is noted within the right lobe of the liver, measuring 2.9 cm maximally. IVC: No abnormality visualized. Pancreas: Visualized portion unremarkable. Spleen: The spleen is of normal size and echotexture measuring 8.7 cm, within normal limits. Right Kidney: Length: 13.1 cm, within normal limits. Echogenicity within normal limits. No mass or hydronephrosis visualized. Left Kidney: Length: 10.8 cm, within normal limits. Echogenicity within normal limits. No mass or hydronephrosis visualized. Abdominal aorta: No aneurysm visualized. Other findings: None. IMPRESSION: 1. No acute or focal lesion to explain the patient's symptoms. 2. Benign appearing cyst in the right lobe of the liver. Electronically Signed   By: Lawrence Santiago M.D.   On: 02/18/2014 12:26    Assessment & Plan:   There are no diagnoses linked to this encounter. I am having [name] maintain her multivitamin with minerals, spironolactone, carvedilol, ibuprofen, HYDROcodone-ibuprofen, cholecalciferol, ALPRAZolam, and furosemide.  No orders of the defined types were placed in this encounter.     Follow-up: No Follow-up on file.  Walker Kehr, MD

## 2015-06-08 NOTE — Assessment & Plan Note (Signed)
Dr Nelva Bush [facility] Ibuprofen rare Norco prn  Potential benefits of a long term opioids use as well as potential risks (i.e. addiction risk, apnea etc) and complications (i.e. Somnolence, constipation and others) were explained to the patient and were aknowledged.

## 2015-06-08 NOTE — Assessment & Plan Note (Signed)
Start po abx F/u w/dentist

## 2015-06-09 ENCOUNTER — Other Ambulatory Visit: Payer: Self-pay | Admitting: Internal Medicine

## 2015-06-09 MED ORDER — ERGOCALCIFEROL 1.25 MG (50000 UT) PO CAPS: 50000.0000 [IU] | ORAL_CAPSULE | ORAL | Status: DC

## 2015-06-19 DIAGNOSIS — M5136 Other intervertebral disc degeneration, lumbar region: Secondary | ICD-10-CM | POA: Diagnosis not present

## 2015-06-19 DIAGNOSIS — Z79891 Long term (current) use of opiate analgesic: Secondary | ICD-10-CM | POA: Diagnosis not present

## 2015-06-19 DIAGNOSIS — G894 Chronic pain syndrome: Secondary | ICD-10-CM | POA: Diagnosis not present

## 2015-06-19 DIAGNOSIS — M50322 Other cervical disc degeneration at C5-C6 level: Secondary | ICD-10-CM | POA: Diagnosis not present

## 2015-07-06 ENCOUNTER — Other Ambulatory Visit: Payer: Self-pay | Admitting: Internal Medicine

## 2015-08-25 DIAGNOSIS — H17823 Peripheral opacity of cornea, bilateral: Secondary | ICD-10-CM | POA: Diagnosis not present

## 2015-08-25 DIAGNOSIS — H25813 Combined forms of age-related cataract, bilateral: Secondary | ICD-10-CM | POA: Diagnosis not present

## 2015-08-25 DIAGNOSIS — D3131 Benign neoplasm of right choroid: Secondary | ICD-10-CM | POA: Diagnosis not present

## 2015-09-09 ENCOUNTER — Encounter: Payer: Self-pay | Admitting: Internal Medicine

## 2015-09-09 ENCOUNTER — Ambulatory Visit (INDEPENDENT_AMBULATORY_CARE_PROVIDER_SITE_OTHER): Payer: Medicare Other | Admitting: Internal Medicine

## 2015-09-09 ENCOUNTER — Ambulatory Visit (INDEPENDENT_AMBULATORY_CARE_PROVIDER_SITE_OTHER)
Admission: RE | Admit: 2015-09-09 | Discharge: 2015-09-09 | Disposition: A | Discharge status: Home / Self Care | Payer: Medicare Other | Source: Ambulatory Visit | Attending: Internal Medicine | Admitting: Internal Medicine

## 2015-09-09 VITALS — BP 130/60 | HR 85 | Ht 63.0 in | Wt 168.0 lb

## 2015-09-09 DIAGNOSIS — D485 Neoplasm of uncertain behavior of skin: Secondary | ICD-10-CM

## 2015-09-09 DIAGNOSIS — Z0000 Encounter for general adult medical examination without abnormal findings: Secondary | ICD-10-CM | POA: Diagnosis not present

## 2015-09-09 DIAGNOSIS — I10 Essential (primary) hypertension: Secondary | ICD-10-CM | POA: Diagnosis not present

## 2015-09-09 DIAGNOSIS — R0789 Other chest pain: Secondary | ICD-10-CM

## 2015-09-09 DIAGNOSIS — G8929 Other chronic pain: Secondary | ICD-10-CM

## 2015-09-09 DIAGNOSIS — R079 Chest pain, unspecified: Secondary | ICD-10-CM | POA: Diagnosis not present

## 2015-09-09 DIAGNOSIS — M5440 Lumbago with sciatica, unspecified side: Secondary | ICD-10-CM

## 2015-09-09 DIAGNOSIS — E559 Vitamin D deficiency, unspecified: Secondary | ICD-10-CM

## 2015-09-09 DIAGNOSIS — Z1239 Encounter for other screening for malignant neoplasm of breast: Secondary | ICD-10-CM

## 2015-09-09 NOTE — Assessment & Plan Note (Signed)
MS[facility] Ibuprofen rare Norco prn  Potential benefits of a long term opioids use as well as potential risks (i.e. addiction risk, apnea etc) and complications (i.e. Somnolence, constipation and others) were explained to the patient and were aknowledged.

## 2015-09-09 NOTE — Assessment & Plan Note (Signed)
Here for medicare wellness/physical  Diet: heart healthy  Physical activity: not sedentary  Depression/mood screen: negative - sad/grieving Hearing: intact to whispered voice  Visual acuity: grossly normal, performs annual eye exam  ADLs: capable  Fall risk: low to none  Home safety: good  Cognitive evaluation: intact to orientation, naming, recall and repetition  EOL planning: adv directives, full code/ I agree  I have personally reviewed and have noted  1. The patient's medical, surgical and social history  2. Their use of alcohol, tobacco or illicit drugs  3. Their current medications and supplements  4. The patient's functional ability including ADL's, fall risks, home safety risks and hearing or visual impairment.  5. Diet and physical activities  6. Evidence for depression or mood disorders 7. The roster of all physicians providing medical care to patient - is listed in the Snapshot section of the chart and reviewed today.    Today patient counseled on age appropriate routine health concerns for screening and prevention, each reviewed and up to date or declined. Immunizations reviewed and up to date or declined. Labs ordered and reviewed. Risk factors for depression reviewed and negative. Hearing function and visual acuity are intact. ADLs screened and addressed as needed. Functional ability and level of safety reviewed and appropriate. Education, counseling and referrals performed based on assessed risks today. Patient provided with a copy of personalized plan for preventive services.

## 2015-09-09 NOTE — Progress Notes (Signed)
Pre visit review using our clinic review tool, if applicable. No additional management support is needed unless otherwise documented below in the visit note. 

## 2015-09-09 NOTE — Progress Notes (Signed)
Subjective:  Patient ID: Dawn Howard, female    DOB: Jul 25, 1950  Age: 65 y.o. MRN: [id number]  CC: No chief complaint on file.   HPI Dawn Howard presents for a well exam. F/u anxiety, HTN, OA . C/o sadness - 6 people died in her circle...grieving. Pt had a CP in [place] - April 21; no CP now  Outpatient Prescriptions Prior to Visit  Medication Sig Dispense Refill  . ALPRAZolam (XANAX) 0.25 MG tablet Take 1 tablet (0.25 mg total) by mouth 2 (two) times daily. 60 tablet 3  . carvedilol (COREG) 12.5 MG tablet Take 1 tablet (12.5 mg total) by mouth 2 (two) times daily with a meal. 60 tablet 11  . cholecalciferol (VITAMIN D) 1000 UNITS tablet Take 2 tablets (2,000 Units total) by mouth daily. 100 tablet 3  . doxycycline (VIBRA-TABS) 100 MG tablet Take 1 tablet (100 mg total) by mouth 2 (two) times daily. 20 tablet 0  . ergocalciferol (VITAMIN D2) 50000 units capsule Take 1 capsule (50,000 Units total) by mouth once a week. 6 capsule 0  . HYDROcodone-ibuprofen (VICOPROFEN) 7.5-200 MG per tablet Take 1 tablet by mouth 2 (two) times daily as needed.  0  . ibuprofen (ADVIL,MOTRIN) 600 MG tablet Take 1 tablet (600 mg total) by mouth every 8 (eight) hours as needed for headache or moderate pain. 60 tablet 3  . Multiple Vitamin (MULITIVITAMIN WITH MINERALS) TABS Take 1 tablet by mouth daily.    Marland Kitchen spironolactone (ALDACTONE) 50 MG tablet TAKE 1 TABLET BY MOUTH DAILY 30 tablet 11   No facility-administered medications prior to visit.    ROS Review of Systems  Constitutional: Negative for chills, activity change, appetite change, fatigue and unexpected weight change.  HENT: Negative for congestion, mouth sores and sinus pressure.   Eyes: Negative for visual disturbance.  Respiratory: Negative for cough and chest tightness.   Gastrointestinal: Negative for nausea and abdominal pain.  Genitourinary: Negative for frequency, difficulty urinating and vaginal pain.  Musculoskeletal: Positive  for arthralgias. Negative for back pain and gait problem.  Skin: Negative for pallor and rash.  Neurological: Negative for dizziness, tremors, weakness, numbness and headaches.  Psychiatric/Behavioral: Negative for confusion and sleep disturbance. The patient is nervous/anxious.     Objective:  BP 130/60 mmHg  Pulse 85  Ht 5\' 3"  (1.6 m)  Wt 168 lb (76.204 kg)  BMI 29.77 kg/m2  SpO2 96%  BP Readings from Last 3 Encounters:  09/09/15 130/60  06/08/15 130/80  02/26/15 120/90    Wt Readings from Last 3 Encounters:  09/09/15 168 lb (76.204 kg)  06/08/15 164 lb (74.39 kg)  02/26/15 166 lb 8 oz (75.524 kg)    Physical Exam  Constitutional: She appears well-developed. No distress.  HENT:  Head: Normocephalic.  Right Ear: External ear normal.  Left Ear: External ear normal.  Nose: Nose normal.  Mouth/Throat: Oropharynx is clear and moist.  Eyes: Conjunctivae are normal. Pupils are equal, round, and reactive to light. Right eye exhibits no discharge. Left eye exhibits no discharge.  Neck: Normal range of motion. Neck supple. No JVD present. No tracheal deviation present. No thyromegaly present.  Cardiovascular: Normal rate, regular rhythm and normal heart sounds.   Pulmonary/Chest: No stridor. No respiratory distress. She has no wheezes.  Abdominal: Soft. Bowel sounds are normal. She exhibits no distension and no mass. There is no tenderness. There is no rebound and no guarding.  Musculoskeletal: She exhibits no edema or tenderness.  Lymphadenopathy:    She  has no cervical adenopathy.  Neurological: She displays normal reflexes. No cranial nerve deficit. She exhibits normal muscle tone. Coordination normal.  Skin: No rash noted. No erythema.  Psychiatric: She has a normal mood and affect. Her behavior is normal. Judgment and thought content normal.  L thigh cherry angioma 4 mm  Procedure: EKG Indication: chest pain Impression: NSR. Low voltage. No acute/new changes.   Lab  Results  Component Value Date   WBC 10.9* 06/08/2015   HGB 15.6* 06/08/2015   HCT 46.2* 06/08/2015   PLT 283.0 06/08/2015   GLUCOSE 91 06/08/2015   CHOL 226* 06/08/2015   TRIG 152.0* 06/08/2015   HDL 53.80 06/08/2015   LDLDIRECT 145.3 01/27/2013   LDLCALC 142* 06/08/2015   ALT 28 06/08/2015   AST 20 06/08/2015   NA 139 06/08/2015   K 4.1 06/08/2015   CL 105 06/08/2015   CREATININE 0.93 06/08/2015   BUN 31* 06/08/2015   CO2 24 06/08/2015   TSH 1.71 06/08/2015    US Abdomen Complete  02/18/2014  CLINICAL DATA:  Nausea and vomiting. Gastritis. Urinary tract infection. Elevated liver enzymes. EXAM: ULTRASOUND ABDOMEN COMPLETE COMPARISON:  None. FINDINGS: Gallbladder: No gallstones or wall thickening visualized. No sonographic Murphy sign noted. Wall thickness is within normal limits at 1.3 mm. Common bile duct: Diameter: 2.5 mm, within normal limits. Liver: A benign appearing cyst is noted within the right lobe of the liver, measuring 2.9 cm maximally. IVC: No abnormality visualized. Pancreas: Visualized portion unremarkable. Spleen: The spleen is of normal size and echotexture measuring 8.7 cm, within normal limits. Right Kidney: Length: 13.1 cm, within normal limits. Echogenicity within normal limits. No mass or hydronephrosis visualized. Left Kidney: Length: 10.8 cm, within normal limits. Echogenicity within normal limits. No mass or hydronephrosis visualized. Abdominal aorta: No aneurysm visualized. Other findings: None. IMPRESSION: 1. No acute or focal lesion to explain the patient's symptoms. 2. Benign appearing cyst in the right lobe of the liver. Electronically Signed   By: Lawrence Santiago M.D.   On: 02/18/2014 12:26    Assessment & Plan:   There are no diagnoses linked to this encounter. I am having [name] maintain her multivitamin with minerals, carvedilol, ibuprofen, HYDROcodone-ibuprofen, cholecalciferol, ALPRAZolam, doxycycline, ergocalciferol, and spironolactone.  No  orders of the defined types were placed in this encounter.     Follow-up: No Follow-up on file.  Walker Kehr, MD

## 2015-09-09 NOTE — Assessment & Plan Note (Signed)
On Coreg, Spironolactone 

## 2015-09-09 NOTE — Assessment & Plan Note (Signed)
5/17 ?etiology EKG CXR Pt declined a stress test

## 2015-09-09 NOTE — Patient Instructions (Signed)
Preventive Care for Adults, Female A healthy lifestyle and preventive care can promote health and wellness. Preventive health guidelines for women include the following key practices.  A routine yearly physical is a good way to check with your health care provider about your health and preventive screening. It is a chance to share any concerns and updates on your health and to receive a thorough exam.  Visit your dentist for a routine exam and preventive care every 6 months. Brush your teeth twice a day and floss once a day. Good oral hygiene prevents tooth decay and gum disease.  The frequency of eye exams is based on your age, health, family medical history, use of contact lenses, and other factors. Follow your health care provider's recommendations for frequency of eye exams.  Eat a healthy diet. Foods like vegetables, fruits, whole grains, low-fat dairy products, and lean protein foods contain the nutrients you need without too many calories. Decrease your intake of foods high in solid fats, added sugars, and salt. Eat the right amount of calories for you.Get information about a proper diet from your health care provider, if necessary.  Regular physical exercise is one of the most important things you can do for your health. Most adults should get at least 150 minutes of moderate-intensity exercise (any activity that increases your heart rate and causes you to sweat) each week. In addition, most adults need muscle-strengthening exercises on 2 or more days a week.  Maintain a healthy weight. The body mass index (BMI) is a screening tool to identify possible weight problems. It provides an estimate of body fat based on height and weight. Your health care provider can find your BMI and can help you achieve or maintain a healthy weight.For adults 20 years and older:  A BMI below 18.5 is considered underweight.  A BMI of 18.5 to 24.9 is normal.  A BMI of 25 to 29.9 is considered overweight.  A  BMI of 30 and above is considered obese.  Maintain normal blood lipids and cholesterol levels by exercising and minimizing your intake of saturated fat. Eat a balanced diet with plenty of fruit and vegetables. Blood tests for lipids and cholesterol should begin at age 45 and be repeated every 5 years. If your lipid or cholesterol levels are high, you are over 50, or you are at high risk for heart disease, you may need your cholesterol levels checked more frequently.Ongoing high lipid and cholesterol levels should be treated with medicines if diet and exercise are not working.  If you smoke, find out from your health care provider how to quit. If you do not use tobacco, do not start.  Lung cancer screening is recommended for adults aged 45-80 years who are at high risk for developing lung cancer because of a history of smoking. A yearly low-dose CT scan of the lungs is recommended for people who have at least a 30-pack-year history of smoking and are a current smoker or have quit within the past 15 years. A pack year of smoking is smoking an average of 1 pack of cigarettes a day for 1 year (for example: 1 pack a day for 30 years or 2 packs a day for 15 years). Yearly screening should continue until the smoker has stopped smoking for at least 15 years. Yearly screening should be stopped for people who develop a health problem that would prevent them from having lung cancer treatment.  If you are pregnant, do not drink alcohol. If you are  breastfeeding, be very cautious about drinking alcohol. If you are not pregnant and choose to drink alcohol, do not have more than 1 drink per day. One drink is considered to be 12 ounces (355 mL) of beer, 5 ounces (148 mL) of wine, or 1.5 ounces (44 mL) of liquor.  Avoid use of street drugs. Do not share needles with anyone. Ask for help if you need support or instructions about stopping the use of drugs.  High blood pressure causes heart disease and increases the risk  of stroke. Your blood pressure should be checked at least every 1 to 2 years. Ongoing high blood pressure should be treated with medicines if weight loss and exercise do not work.  If you are 55-79 years old, ask your health care provider if you should take aspirin to prevent strokes.  Diabetes screening is done by taking a blood sample to check your blood glucose level after you have not eaten for a certain period of time (fasting). If you are not overweight and you do not have risk factors for diabetes, you should be screened once every 3 years starting at age 45. If you are overweight or obese and you are 40-70 years of age, you should be screened for diabetes every year as part of your cardiovascular risk assessment.  Breast cancer screening is essential preventive care for women. You should practice "breast self-awareness." This means understanding the normal appearance and feel of your breasts and may include breast self-examination. Any changes detected, no matter how small, should be reported to a health care provider. Women in their 20s and 30s should have a clinical breast exam (CBE) by a health care provider as part of a regular health exam every 1 to 3 years. After age 40, women should have a CBE every year. Starting at age 40, women should consider having a mammogram (breast X-ray test) every year. Women who have a family history of breast cancer should talk to their health care provider about genetic screening. Women at a high risk of breast cancer should talk to their health care providers about having an MRI and a mammogram every year.  Breast cancer gene (BRCA)-related cancer risk assessment is recommended for women who have family members with BRCA-related cancers. BRCA-related cancers include breast, ovarian, tubal, and peritoneal cancers. Having family members with these cancers may be associated with an increased risk for harmful changes (mutations) in the breast cancer genes BRCA1 and  BRCA2. Results of the assessment will determine the need for genetic counseling and BRCA1 and BRCA2 testing.  Your health care provider may recommend that you be screened regularly for cancer of the pelvic organs (ovaries, uterus, and vagina). This screening involves a pelvic examination, including checking for microscopic changes to the surface of your cervix (Pap test). You may be encouraged to have this screening done every 3 years, beginning at age 21.  For women ages 30-65, health care providers may recommend pelvic exams and Pap testing every 3 years, or they may recommend the Pap and pelvic exam, combined with testing for human papilloma virus (HPV), every 5 years. Some types of HPV increase your risk of cervical cancer. Testing for HPV may also be done on women of any age with unclear Pap test results.  Other health care providers may not recommend any screening for nonpregnant women who are considered low risk for pelvic cancer and who do not have symptoms. Ask your health care provider if a screening pelvic exam is right for   you.  If you have had past treatment for cervical cancer or a condition that could lead to cancer, you need Pap tests and screening for cancer for at least 20 years after your treatment. If Pap tests have been discontinued, your risk factors (such as having a new sexual partner) need to be reassessed to determine if screening should resume. Some women have medical problems that increase the chance of getting cervical cancer. In these cases, your health care provider may recommend more frequent screening and Pap tests.  Colorectal cancer can be detected and often prevented. Most routine colorectal cancer screening begins at the age of 50 years and continues through age 75 years. However, your health care provider may recommend screening at an earlier age if you have risk factors for colon cancer. On a yearly basis, your health care provider may provide home test kits to check  for hidden blood in the stool. Use of a small camera at the end of a tube, to directly examine the colon (sigmoidoscopy or colonoscopy), can detect the earliest forms of colorectal cancer. Talk to your health care provider about this at age 50, when routine screening begins. Direct exam of the colon should be repeated every 5-10 years through age 75 years, unless early forms of precancerous polyps or small growths are found.  People who are at an increased risk for hepatitis B should be screened for this virus. You are considered at high risk for hepatitis B if:  You were born in a country where hepatitis B occurs often. Talk with your health care provider about which countries are considered high risk.  Your parents were born in a high-risk country and you have not received a shot to protect against hepatitis B (hepatitis B vaccine).  You have HIV or AIDS.  You use needles to inject street drugs.  You live with, or have sex with, someone who has hepatitis B.  You get hemodialysis treatment.  You take certain medicines for conditions like cancer, organ transplantation, and autoimmune conditions.  Hepatitis C blood testing is recommended for all people born from 1945 through 1965 and any individual with known risks for hepatitis C.  Practice safe sex. Use condoms and avoid high-risk sexual practices to reduce the spread of sexually transmitted infections (STIs). STIs include gonorrhea, chlamydia, syphilis, trichomonas, herpes, HPV, and human immunodeficiency virus (HIV). Herpes, HIV, and HPV are viral illnesses that have no cure. They can result in disability, cancer, and death.  You should be screened for sexually transmitted illnesses (STIs) including gonorrhea and chlamydia if:  You are sexually active and are younger than 24 years.  You are older than 24 years and your health care provider tells you that you are at risk for this type of infection.  Your sexual activity has changed  since you were last screened and you are at an increased risk for chlamydia or gonorrhea. Ask your health care provider if you are at risk.  If you are at risk of being infected with HIV, it is recommended that you take a prescription medicine daily to prevent HIV infection. This is called preexposure prophylaxis (PrEP). You are considered at risk if:  You are sexually active and do not regularly use condoms or know the HIV status of your partner(s).  You take drugs by injection.  You are sexually active with a partner who has HIV.  Talk with your health care provider about whether you are at high risk of being infected with HIV. If   you choose to begin PrEP, you should first be tested for HIV. You should then be tested every 3 months for as long as you are taking PrEP.  Osteoporosis is a disease in which the bones lose minerals and strength with aging. This can result in serious bone fractures or breaks. The risk of osteoporosis can be identified using a bone density scan. Women ages 67 years and over and women at risk for fractures or osteoporosis should discuss screening with their health care providers. Ask your health care provider whether you should take a calcium supplement or vitamin D to reduce the rate of osteoporosis.  Menopause can be associated with physical symptoms and risks. Hormone replacement therapy is available to decrease symptoms and risks. You should talk to your health care provider about whether hormone replacement therapy is right for you.  Use sunscreen. Apply sunscreen liberally and repeatedly throughout the day. You should seek shade when your shadow is shorter than you. Protect yourself by wearing long sleeves, pants, a wide-brimmed hat, and sunglasses year round, whenever you are outdoors.  Once a month, do a whole body skin exam, using a mirror to look at the skin on your back. Tell your health care provider of new moles, moles that have irregular borders, moles that  are larger than a pencil eraser, or moles that have changed in shape or color.  Stay current with required vaccines (immunizations).  Influenza vaccine. All adults should be immunized every year.  Tetanus, diphtheria, and acellular pertussis (Td, Tdap) vaccine. Pregnant women should receive 1 dose of Tdap vaccine during each pregnancy. The dose should be obtained regardless of the length of time since the last dose. Immunization is preferred during the 27th-36th week of gestation. An adult who has not previously received Tdap or who does not know her vaccine status should receive 1 dose of Tdap. This initial dose should be followed by tetanus and diphtheria toxoids (Td) booster doses every 10 years. Adults with an unknown or incomplete history of completing a 3-dose immunization series with Td-containing vaccines should begin or complete a primary immunization series including a Tdap dose. Adults should receive a Td booster every 10 years.  Varicella vaccine. An adult without evidence of immunity to varicella should receive 2 doses or a second dose if she has previously received 1 dose. Pregnant females who do not have evidence of immunity should receive the first dose after pregnancy. This first dose should be obtained before leaving the health care facility. The second dose should be obtained 4-8 weeks after the first dose.  Human papillomavirus (HPV) vaccine. Females aged 13-26 years who have not received the vaccine previously should obtain the 3-dose series. The vaccine is not recommended for use in pregnant females. However, pregnancy testing is not needed before receiving a dose. If a female is found to be pregnant after receiving a dose, no treatment is needed. In that case, the remaining doses should be delayed until after the pregnancy. Immunization is recommended for any person with an immunocompromised condition through the age of 61 years if she did not get any or all doses earlier. During the  3-dose series, the second dose should be obtained 4-8 weeks after the first dose. The third dose should be obtained 24 weeks after the first dose and 16 weeks after the second dose.  Zoster vaccine. One dose is recommended for adults aged 30 years or older unless certain conditions are present.  Measles, mumps, and rubella (MMR) vaccine. Adults born  before 1957 generally are considered immune to measles and mumps. Adults born in 1957 or later should have 1 or more doses of MMR vaccine unless there is a contraindication to the vaccine or there is laboratory evidence of immunity to each of the three diseases. A routine second dose of MMR vaccine should be obtained at least 28 days after the first dose for students attending postsecondary schools, health care workers, or international travelers. People who received inactivated measles vaccine or an unknown type of measles vaccine during 1963-1967 should receive 2 doses of MMR vaccine. People who received inactivated mumps vaccine or an unknown type of mumps vaccine before 1979 and are at high risk for mumps infection should consider immunization with 2 doses of MMR vaccine. For females of childbearing age, rubella immunity should be determined. If there is no evidence of immunity, females who are not pregnant should be vaccinated. If there is no evidence of immunity, females who are pregnant should delay immunization until after pregnancy. Unvaccinated health care workers born before 1957 who lack laboratory evidence of measles, mumps, or rubella immunity or laboratory confirmation of disease should consider measles and mumps immunization with 2 doses of MMR vaccine or rubella immunization with 1 dose of MMR vaccine.  Pneumococcal 13-valent conjugate (PCV13) vaccine. When indicated, a person who is uncertain of his immunization history and has no record of immunization should receive the PCV13 vaccine. All adults 65 years of age and older should receive this  vaccine. An adult aged 19 years or older who has certain medical conditions and has not been previously immunized should receive 1 dose of PCV13 vaccine. This PCV13 should be followed with a dose of pneumococcal polysaccharide (PPSV23) vaccine. Adults who are at high risk for pneumococcal disease should obtain the PPSV23 vaccine at least 8 weeks after the dose of PCV13 vaccine. Adults older than 67 years of age who have normal immune system function should obtain the PPSV23 vaccine dose at least 1 year after the dose of PCV13 vaccine.  Pneumococcal polysaccharide (PPSV23) vaccine. When PCV13 is also indicated, PCV13 should be obtained first. All adults aged 65 years and older should be immunized. An adult younger than age 65 years who has certain medical conditions should be immunized. Any person who resides in a nursing home or long-term care facility should be immunized. An adult smoker should be immunized. People with an immunocompromised condition and certain other conditions should receive both PCV13 and PPSV23 vaccines. People with human immunodeficiency virus (HIV) infection should be immunized as soon as possible after diagnosis. Immunization during chemotherapy or radiation therapy should be avoided. Routine use of PPSV23 vaccine is not recommended for American Indians, [place] Natives, or people younger than 65 years unless there are medical conditions that require PPSV23 vaccine. When indicated, people who have unknown immunization and have no record of immunization should receive PPSV23 vaccine. One-time revaccination 5 years after the first dose of PPSV23 is recommended for people aged 19-64 years who have chronic kidney failure, nephrotic syndrome, asplenia, or immunocompromised conditions. People who received 1-2 doses of PPSV23 before age 65 years should receive another dose of PPSV23 vaccine at age 65 years or later if at least 5 years have passed since the previous dose. Doses of PPSV23 are not  needed for people immunized with PPSV23 at or after age 65 years.  Meningococcal vaccine. Adults with asplenia or persistent complement component deficiencies should receive 2 doses of quadrivalent meningococcal conjugate (MenACWY-D) vaccine. The doses should be obtained   at least 2 months apart. Microbiologists working with certain meningococcal bacteria, [place] recruits, people at risk during an outbreak, and people who travel to or live in countries with a high rate of meningitis should be immunized. A first-year college student up through age 34 years who is living in a residence hall should receive a dose if she did not receive a dose on or after her 16th birthday. Adults who have certain high-risk conditions should receive one or more doses of vaccine.  Hepatitis A vaccine. Adults who wish to be protected from this disease, have certain high-risk conditions, work with hepatitis A-infected animals, work in hepatitis A research labs, or travel to or work in countries with a high rate of hepatitis A should be immunized. Adults who were previously unvaccinated and who anticipate close contact with an international adoptee during the first 60 days after arrival in the [place] from a country with a high rate of hepatitis A should be immunized.  Hepatitis B vaccine. Adults who wish to be protected from this disease, have certain high-risk conditions, may be exposed to blood or other infectious body fluids, are household contacts or sex partners of hepatitis B positive people, are clients or workers in certain care facilities, or travel to or work in countries with a high rate of hepatitis B should be immunized.  Haemophilus influenzae type b (Hib) vaccine. A previously unvaccinated person with asplenia or sickle cell disease or having a scheduled splenectomy should receive 1 dose of Hib vaccine. Regardless of previous immunization, a recipient of a hematopoietic stem cell transplant should receive a  3-dose series 6-12 months after her successful transplant. Hib vaccine is not recommended for adults with HIV infection. Preventive Services / Frequency Ages 35 to 4 years  Blood pressure check.** / Every 3-5 years.  Lipid and cholesterol check.** / Every 5 years beginning at age 60.  Clinical breast exam.** / Every 3 years for women in their 71s and 10s.  BRCA-related cancer risk assessment.** / For women who have family members with a BRCA-related cancer (breast, ovarian, tubal, or peritoneal cancers).  Pap test.** / Every 2 years from ages 76 through 26. Every 3 years starting at age 61 through age 76 or 93 with a history of 3 consecutive normal Pap tests.  HPV screening.** / Every 3 years from ages 37 through ages 60 to 51 with a history of 3 consecutive normal Pap tests.  Hepatitis C blood test.** / For any individual with known risks for hepatitis C.  Skin self-exam. / Monthly.  Influenza vaccine. / Every year.  Tetanus, diphtheria, and acellular pertussis (Tdap, Td) vaccine.** / Consult your health care provider. Pregnant women should receive 1 dose of Tdap vaccine during each pregnancy. 1 dose of Td every 10 years.  Varicella vaccine.** / Consult your health care provider. Pregnant females who do not have evidence of immunity should receive the first dose after pregnancy.  HPV vaccine. / 3 doses over 6 months, if 93 and younger. The vaccine is not recommended for use in pregnant females. However, pregnancy testing is not needed before receiving a dose.  Measles, mumps, rubella (MMR) vaccine.** / You need at least 1 dose of MMR if you were born in 1957 or later. You may also need a 2nd dose. For females of childbearing age, rubella immunity should be determined. If there is no evidence of immunity, females who are not pregnant should be vaccinated. If there is no evidence of immunity, females who are  pregnant should delay immunization until after pregnancy.  Pneumococcal  13-valent conjugate (PCV13) vaccine.** / Consult your health care provider.  Pneumococcal polysaccharide (PPSV23) vaccine.** / 1 to 2 doses if you smoke cigarettes or if you have certain conditions.  Meningococcal vaccine.** / 1 dose if you are age 68 to 8 years and a Market researcher living in a residence hall, or have one of several medical conditions, you need to get vaccinated against meningococcal disease. You may also need additional booster doses.  Hepatitis A vaccine.** / Consult your health care provider.  Hepatitis B vaccine.** / Consult your health care provider.  Haemophilus influenzae type b (Hib) vaccine.** / Consult your health care provider. Ages 7 to 53 years  Blood pressure check.** / Every year.  Lipid and cholesterol check.** / Every 5 years beginning at age 25 years.  Lung cancer screening. / Every year if you are aged 11-80 years and have a 30-pack-year history of smoking and currently smoke or have quit within the past 15 years. Yearly screening is stopped once you have quit smoking for at least 15 years or develop a health problem that would prevent you from having lung cancer treatment.  Clinical breast exam.** / Every year after age 48 years.  BRCA-related cancer risk assessment.** / For women who have family members with a BRCA-related cancer (breast, ovarian, tubal, or peritoneal cancers).  Mammogram.** / Every year beginning at age 41 years and continuing for as long as you are in good health. Consult with your health care provider.  Pap test.** / Every 3 years starting at age 65 years through age 37 or 70 years with a history of 3 consecutive normal Pap tests.  HPV screening.** / Every 3 years from ages 72 years through ages 60 to 40 years with a history of 3 consecutive normal Pap tests.  Fecal occult blood test (FOBT) of stool. / Every year beginning at age 21 years and continuing until age 5 years. You may not need to do this test if you get  a colonoscopy every 10 years.  Flexible sigmoidoscopy or colonoscopy.** / Every 5 years for a flexible sigmoidoscopy or every 10 years for a colonoscopy beginning at age 35 years and continuing until age 48 years.  Hepatitis C blood test.** / For all people born from [birth year] through 1965 and any individual with known risks for hepatitis C.  Skin self-exam. / Monthly.  Influenza vaccine. / Every year.  Tetanus, diphtheria, and acellular pertussis (Tdap/Td) vaccine.** / Consult your health care provider. Pregnant women should receive 1 dose of Tdap vaccine during each pregnancy. 1 dose of Td every 10 years.  Varicella vaccine.** / Consult your health care provider. Pregnant females who do not have evidence of immunity should receive the first dose after pregnancy.  Zoster vaccine.** / 1 dose for adults aged 30 years or older.  Measles, mumps, rubella (MMR) vaccine.** / You need at least 1 dose of MMR if you were born in 1957 or later. You may also need a second dose. For females of childbearing age, rubella immunity should be determined. If there is no evidence of immunity, females who are not pregnant should be vaccinated. If there is no evidence of immunity, females who are pregnant should delay immunization until after pregnancy.  Pneumococcal 13-valent conjugate (PCV13) vaccine.** / Consult your health care provider.  Pneumococcal polysaccharide (PPSV23) vaccine.** / 1 to 2 doses if you smoke cigarettes or if you have certain conditions.  Meningococcal vaccine.** /  Consult your health care provider.  Hepatitis A vaccine.** / Consult your health care provider.  Hepatitis B vaccine.** / Consult your health care provider.  Haemophilus influenzae type b (Hib) vaccine.** / Consult your health care provider. Ages 64 years and over  Blood pressure check.** / Every year.  Lipid and cholesterol check.** / Every 5 years beginning at age 23 years.  Lung cancer screening. / Every year if you  are aged 16-80 years and have a 30-pack-year history of smoking and currently smoke or have quit within the past 15 years. Yearly screening is stopped once you have quit smoking for at least 15 years or develop a health problem that would prevent you from having lung cancer treatment.  Clinical breast exam.** / Every year after age 74 years.  BRCA-related cancer risk assessment.** / For women who have family members with a BRCA-related cancer (breast, ovarian, tubal, or peritoneal cancers).  Mammogram.** / Every year beginning at age 44 years and continuing for as long as you are in good health. Consult with your health care provider.  Pap test.** / Every 3 years starting at age 58 years through age 22 or 39 years with 3 consecutive normal Pap tests. Testing can be stopped between 65 and 70 years with 3 consecutive normal Pap tests and no abnormal Pap or HPV tests in the past 10 years.  HPV screening.** / Every 3 years from ages 64 years through ages 70 or 61 years with a history of 3 consecutive normal Pap tests. Testing can be stopped between 65 and 70 years with 3 consecutive normal Pap tests and no abnormal Pap or HPV tests in the past 10 years.  Fecal occult blood test (FOBT) of stool. / Every year beginning at age 40 years and continuing until age 27 years. You may not need to do this test if you get a colonoscopy every 10 years.  Flexible sigmoidoscopy or colonoscopy.** / Every 5 years for a flexible sigmoidoscopy or every 10 years for a colonoscopy beginning at age 7 years and continuing until age 32 years.  Hepatitis C blood test.** / For all people born from [birth year] through 1967 and any individual with known risks for hepatitis C.  Osteoporosis screening.** / A one-time screening for women ages 30 years and over and women at risk for fractures or osteoporosis.  Skin self-exam. / Monthly.  Influenza vaccine. / Every year.  Tetanus, diphtheria, and acellular pertussis (Tdap/Td)  vaccine.** / 1 dose of Td every 10 years.  Varicella vaccine.** / Consult your health care provider.  Zoster vaccine.** / 1 dose for adults aged 35 years or older.  Pneumococcal 13-valent conjugate (PCV13) vaccine.** / Consult your health care provider.  Pneumococcal polysaccharide (PPSV23) vaccine.** / 1 dose for all adults aged 46 years and older.  Meningococcal vaccine.** / Consult your health care provider.  Hepatitis A vaccine.** / Consult your health care provider.  Hepatitis B vaccine.** / Consult your health care provider.  Haemophilus influenzae type b (Hib) vaccine.** / Consult your health care provider. ** Family history and personal history of risk and conditions may change your health care provider's recommendations.   This information is not intended to replace advice given to you by your health care provider. Make sure you discuss any questions you have with your health care provider.   Document Released: 06/20/2001 Document Revised: 05/15/2014 Document Reviewed: 09/19/2010 Elsevier Interactive Patient Education [employer].

## 2015-09-09 NOTE — Assessment & Plan Note (Signed)
5.17 irritated cherry angioma, L prox thigh Will bx if it did not fall off

## 2015-09-09 NOTE — Progress Notes (Deleted)
   Subjective:    Patient ID: Dawn Howard, female    DOB: August 25, 1950, 65 y.o.   MRN: [id number]  HPI    Review of Systems     Objective:   Physical Exam        Assessment & Plan:

## 2015-09-17 DIAGNOSIS — H25813 Combined forms of age-related cataract, bilateral: Secondary | ICD-10-CM | POA: Diagnosis not present

## 2015-09-17 DIAGNOSIS — H17821 Peripheral opacity of cornea, right eye: Secondary | ICD-10-CM | POA: Diagnosis not present

## 2015-09-17 DIAGNOSIS — H43393 Other vitreous opacities, bilateral: Secondary | ICD-10-CM | POA: Diagnosis not present

## 2015-09-23 ENCOUNTER — Ambulatory Visit: Payer: Medicare Other

## 2015-10-07 DIAGNOSIS — H2511 Age-related nuclear cataract, right eye: Secondary | ICD-10-CM | POA: Diagnosis not present

## 2015-10-14 ENCOUNTER — Ambulatory Visit
Admission: RE | Admit: 2015-10-14 | Discharge: 2015-10-14 | Disposition: A | Discharge status: Home / Self Care | Payer: Medicare Other | Source: Ambulatory Visit | Attending: Internal Medicine | Admitting: Internal Medicine

## 2015-10-14 DIAGNOSIS — Z1231 Encounter for screening mammogram for malignant neoplasm of breast: Secondary | ICD-10-CM | POA: Diagnosis not present

## 2015-10-18 DIAGNOSIS — H2512 Age-related nuclear cataract, left eye: Secondary | ICD-10-CM | POA: Diagnosis not present

## 2015-10-21 DIAGNOSIS — H268 Other specified cataract: Secondary | ICD-10-CM | POA: Diagnosis not present

## 2015-10-21 DIAGNOSIS — H2512 Age-related nuclear cataract, left eye: Secondary | ICD-10-CM | POA: Diagnosis not present

## 2015-10-30 DIAGNOSIS — G894 Chronic pain syndrome: Secondary | ICD-10-CM | POA: Diagnosis not present

## 2015-10-30 DIAGNOSIS — M5136 Other intervertebral disc degeneration, lumbar region: Secondary | ICD-10-CM | POA: Diagnosis not present

## 2015-10-30 DIAGNOSIS — Z79891 Long term (current) use of opiate analgesic: Secondary | ICD-10-CM | POA: Diagnosis not present

## 2015-10-30 DIAGNOSIS — M50322 Other cervical disc degeneration at C5-C6 level: Secondary | ICD-10-CM | POA: Diagnosis not present

## 2015-11-25 ENCOUNTER — Ambulatory Visit: Payer: Medicare Other

## 2015-12-28 ENCOUNTER — Telehealth: Payer: Self-pay | Admitting: Internal Medicine

## 2015-12-28 NOTE — Telephone Encounter (Signed)
Patient Name: Dawn Howard DOB: 1950/07/12 Initial Comment Sudden foot pain starting last night. Has appt Thursday, may be gout Nurse Assessment Nurse: Ronnald Ramp, RN, Miranda Date/Time (Eastern Time): 12/28/2015 11:53:14 AM Confirm and document reason for call. If symptomatic, describe symptoms. You must click the next button to save text entered. ---Caller states she started having pain in her left big toe last night. Has the patient traveled out of the country within the last 30 days? ---Not Applicable Does the patient have any new or worsening symptoms? ---Yes Will a triage be completed? ---Yes Related visit to physician within the last 2 weeks? ---No Does the PT have any chronic conditions? (i.e. diabetes, asthma, etc.) ---Yes List chronic conditions. ---Hx of possible gout, stage 3 kidney failure, HTN, Chronic back pain Is this a behavioral health or substance abuse call? ---No Guidelines Guideline Title Affirmed Question Affirmed Notes Toe Pain [1] SEVERE pain (e.g., excruciating, unable to do any normal activities) AND [2] not improved after 2 hours of pain medicine Final Disposition User See Physician within 4 Hours (or PCP triage) Ronnald Ramp, RN, Miranda Comments Pt rate her pain as 10 out 10 even after taking Vicoprofen. Pt already has an appt scheduled for Thursday at 10 am with Dr. Alain Marion. She states she does not have a way to get to the office today because she is keeping her grandchildren. [email or web address] Recommendations for specific foods or supplements include the following: Highpurine vegetables. Studies have shown that vegetables high in purines do not increase the risk of gout or recurring gout attacks. A healthy diet based on lots of fruits and vegetables can include highpurine vegetables, such as asparagus, spinach, peas, cauliflower or mushrooms. You can also eat  beans or lentils, which are moderately high in purines but are also a good source of protein. Organ and glandular meats. Avoid meats such as liver, kidney and sweetbreads, which have high purine levels and contribute to high blood levels of uric acid. Selected seafood. Avoid the following types of seafood, which are higher in purines than others: PLEASE NOTE: All timestamps contained within this report are represented as [place] Standard Time. CONFIDENTIALTY NOTICE: This fax transmission is intended only for the addressee. It contains information that is legally privileged, confidential or otherwise protected from use or disclosure. If you are not the intended recipient, you are strictly prohibited from reviewing, disclosing, copying using or disseminating any of this information or taking any action in reliance on or regarding this information. If you have received this fax in error, please notify us immediately by telephone so that we can arrange for its return to [place]. Phone: [phone], Toll-Free: [phone], Fax: [phone] Page: 2 of 2 Call Id: [id number] Comments anchovies, herring, sardines, mussels, scallops, trout, haddock, mackerel and tuna. Alcohol. The metabolism of alcohol in your body is thought to increase uric acid production, and alcohol contributes to dehydration. Beer is associated with an increased risk of gout and recurring attacks, as are distilled liquors to some extent. The effect of wine is not as well-understood. If you drink alcohol, talk to your doctor about what is appropriate for you. Vitamin C. Vitamin C may help lower uric acid levels. Talk to your doctor about whether a 500-milligram vitamin C supplement fits into your diet and medication plan. Coffee. Some research suggests that moderate coffee consumption may be associated with a reduced risk of gout, particularly with regular caffeinated coffee. Drinking coffee may not be appropriate for other  medical conditions. Talk to your doctor about  how much coffee is right for you. Cherries. There is some evidence that eating cherries is associated with a reduced risk of gout attacks. Referrals REFERRED TO PCP OFFICE Disagree/Comply: Disagree Disagree/Comply Reason: Unable to find babysitting

## 2015-12-30 ENCOUNTER — Encounter: Payer: Self-pay | Admitting: Internal Medicine

## 2015-12-30 ENCOUNTER — Other Ambulatory Visit (INDEPENDENT_AMBULATORY_CARE_PROVIDER_SITE_OTHER): Payer: Medicare Other

## 2015-12-30 ENCOUNTER — Ambulatory Visit (INDEPENDENT_AMBULATORY_CARE_PROVIDER_SITE_OTHER): Payer: Medicare Other | Admitting: Internal Medicine

## 2015-12-30 DIAGNOSIS — N189 Chronic kidney disease, unspecified: Secondary | ICD-10-CM | POA: Diagnosis not present

## 2015-12-30 DIAGNOSIS — M25579 Pain in unspecified ankle and joints of unspecified foot: Secondary | ICD-10-CM | POA: Insufficient documentation

## 2015-12-30 DIAGNOSIS — M25572 Pain in left ankle and joints of left foot: Secondary | ICD-10-CM

## 2015-12-30 DIAGNOSIS — K219 Gastro-esophageal reflux disease without esophagitis: Secondary | ICD-10-CM | POA: Diagnosis not present

## 2015-12-30 DIAGNOSIS — E876 Hypokalemia: Secondary | ICD-10-CM

## 2015-12-30 DIAGNOSIS — I10 Essential (primary) hypertension: Secondary | ICD-10-CM | POA: Diagnosis not present

## 2015-12-30 LAB — BASIC METABOLIC PANEL
BUN: 31 mg/dL — ABNORMAL HIGH (ref 6–23)
CO2: 28 meq/L (ref 19–32)
Calcium: 9.9 mg/dL (ref 8.4–10.5)
Chloride: 104 mEq/L (ref 96–112)
Creatinine, Ser: 1.14 mg/dL (ref 0.40–1.20)
GFR: 50.47 mL/min — AB (ref >60.00)
GLUCOSE: 90 mg/dL (ref 70–99)
POTASSIUM: 4.2 meq/L (ref 3.5–5.1)
SODIUM: 138 meq/L (ref 135–145)

## 2015-12-30 LAB — HEPATIC FUNCTION PANEL
ALBUMIN: 4.2 g/dL (ref 3.5–5.2)
ALT: 26 U/L (ref 0–35)
AST: 17 U/L (ref 0–37)
Alkaline Phosphatase: 84 U/L (ref 39–117)
BILIRUBIN TOTAL: 0.5 mg/dL (ref 0.2–1.2)
Bilirubin, Direct: 0.1 mg/dL (ref 0.0–0.3)
Total Protein: 7.1 g/dL (ref 6.0–8.3)

## 2015-12-30 LAB — URIC ACID: URIC ACID, SERUM: 8.3 mg/dL — AB (ref 2.4–7.0)

## 2015-12-30 MED ORDER — FAMOTIDINE 20 MG PO TABS: 20.0000 mg | ORAL_TABLET | Freq: Two times a day (BID) | ORAL | 5 refills | Status: DC

## 2015-12-30 MED ORDER — OXYCODONE HCL 5 MG PO TABS: 2.5000 mg | ORAL_TABLET | Freq: Four times a day (QID) | ORAL | 0 refills | Status: DC | PRN

## 2015-12-30 MED ORDER — PREDNISONE 10 MG PO TABS
ORAL_TABLET | ORAL | 1 refills | Status: DC
Start: 2015-12-30 — End: 2018-04-15

## 2015-12-30 NOTE — Patient Instructions (Signed)

## 2015-12-30 NOTE — Assessment & Plan Note (Signed)
Labs

## 2015-12-30 NOTE — Assessment & Plan Note (Signed)
On Coreg, Spironolactone 

## 2015-12-30 NOTE — Assessment & Plan Note (Signed)
8/17 L foot 1st MTP ?gout Take prednisone 40 mg on day 1; then 20 mg on day 2; and then 10 mg on days 3-4 prn gout. Take pc  Potential benefits of  steroid  use as well as potential risks  and complications were explained to the patient and were aknowledged.

## 2015-12-30 NOTE — Progress Notes (Signed)
Subjective:  Patient ID: Dawn Howard, female    DOB: [birth year]  Age: 65 y.o. MRN: [id number]  CC: Follow-up (4 mos) and Toe Pain (reddness-started Mon night)   HPI Dawn Howard presents for HTN, anxiety f/u. C/o trouble swallowing x 6 mo off and on . C/o recurrent stye x2 C/o L foot severe pain x 4 d - acute  Outpatient Medications Prior to Visit  Medication Sig Dispense Refill  . ALPRAZolam (XANAX) 0.25 MG tablet Take 1 tablet (0.25 mg total) by mouth 2 (two) times daily. 60 tablet 3  . carvedilol (COREG) 12.5 MG tablet Take 1 tablet (12.5 mg total) by mouth 2 (two) times daily with a meal. 60 tablet 11  . ergocalciferol (VITAMIN D2) 50000 units capsule Take 1 capsule (50,000 Units total) by mouth once a week. 6 capsule 0  . HYDROcodone-ibuprofen (VICOPROFEN) 7.5-200 MG per tablet Take 1 tablet by mouth 2 (two) times daily as needed.  0  . ibuprofen (ADVIL,MOTRIN) 600 MG tablet Take 1 tablet (600 mg total) by mouth every 8 (eight) hours as needed for headache or moderate pain. 60 tablet 3  . Multiple Vitamin (MULITIVITAMIN WITH MINERALS) TABS Take 1 tablet by mouth daily.    Marland Kitchen spironolactone (ALDACTONE) 50 MG tablet TAKE 1 TABLET BY MOUTH DAILY 30 tablet 11  . doxycycline (VIBRA-TABS) 100 MG tablet Take 1 tablet (100 mg total) by mouth 2 (two) times daily. (Patient not taking: Reported on 12/30/2015) 20 tablet 0   No facility-administered medications prior to visit.     ROS Review of Systems  Constitutional: Negative for activity change, appetite change, chills, fatigue and unexpected weight change.  HENT: Negative for congestion, mouth sores and sinus pressure.   Eyes: Negative for visual disturbance.  Respiratory: Negative for cough, chest tightness and wheezing.   Cardiovascular: Negative for chest pain, palpitations and leg swelling.  Gastrointestinal: Negative for abdominal pain, constipation, nausea and vomiting.  Genitourinary: Negative for difficulty urinating,  frequency and vaginal pain.  Musculoskeletal: Negative for back pain and gait problem.  Skin: Negative for pallor and rash.  Neurological: Negative for dizziness, tremors, weakness, numbness and headaches.  Psychiatric/Behavioral: Negative for confusion and sleep disturbance.    Objective:  BP 110/70 (BP Location: Left Arm, Patient Position: Sitting, Cuff Size: Normal)   Pulse 79   Temp 97.7 F (36.5 C) (Oral)   Ht 5\' 3"  (1.6 m)   Wt 168 lb (76.2 kg)   SpO2 98%   BMI 29.76 kg/m   BP Readings from Last 3 Encounters:  12/30/15 110/70  09/09/15 130/60  06/08/15 130/80    Wt Readings from Last 3 Encounters:  12/30/15 168 lb (76.2 kg)  09/09/15 168 lb (76.2 kg)  06/08/15 164 lb (74.4 kg)    Physical Exam  Constitutional: She appears well-developed. No distress.  HENT:  Head: Normocephalic.  Right Ear: External ear normal.  Left Ear: External ear normal.  Nose: Nose normal.  Mouth/Throat: Oropharynx is clear and moist.  Eyes: Conjunctivae are normal. Pupils are equal, round, and reactive to light. Right eye exhibits no discharge. Left eye exhibits no discharge.  Neck: Normal range of motion. Neck supple. No JVD present. No tracheal deviation present. No thyromegaly present.  Cardiovascular: Normal rate, regular rhythm and normal heart sounds.   Pulmonary/Chest: No stridor. No respiratory distress. She has no wheezes.  Abdominal: Soft. Bowel sounds are normal. She exhibits no distension and no mass. There is no tenderness. There is no rebound and no guarding.  Musculoskeletal: She exhibits no edema or tenderness.  Lymphadenopathy:    She has no cervical adenopathy.  Neurological: She displays normal reflexes. No cranial nerve deficit. She exhibits normal muscle tone. Coordination normal.  Skin: No rash noted. No erythema.  Psychiatric: She has a normal mood and affect. Her behavior is normal. Judgment and thought content normal.  L 1st MTP is red, swollen   Lab Results    Component Value Date   WBC 10.9 (H) 06/08/2015   HGB 15.6 (H) 06/08/2015   HCT 46.2 (H) 06/08/2015   PLT 283.0 06/08/2015   GLUCOSE 91 06/08/2015   CHOL 226 (H) 06/08/2015   TRIG 152.0 (H) 06/08/2015   HDL 53.80 06/08/2015   LDLDIRECT 145.3 01/27/2013   LDLCALC 142 (H) 06/08/2015   ALT 28 06/08/2015   AST 20 06/08/2015   NA 139 06/08/2015   K 4.1 06/08/2015   CL 105 06/08/2015   CREATININE 0.93 06/08/2015   BUN 31 (H) 06/08/2015   CO2 24 06/08/2015   TSH 1.71 06/08/2015    Mm Digital Screening Bilateral  Result Date: 10/15/2015 CLINICAL DATA:  Screening. EXAM: DIGITAL SCREENING BILATERAL MAMMOGRAM WITH CAD COMPARISON:  None. ACR Breast Density Category b: There are scattered areas of fibroglandular density. FINDINGS: There are no findings suspicious for malignancy. Images were processed with CAD. IMPRESSION: No mammographic evidence of malignancy. A result letter of this screening mammogram will be mailed directly to the patient. RECOMMENDATION: Screening mammogram in one year. (Code:SM-B-01Y) BI-RADS CATEGORY  1: Negative. Electronically Signed   By: Abelardo Diesel M.D.   On: 10/15/2015 12:16    Assessment & Plan:   There are no diagnoses linked to this encounter. I am having [name] maintain her multivitamin with minerals, carvedilol, ibuprofen, HYDROcodone-ibuprofen, ALPRAZolam, doxycycline, ergocalciferol, and spironolactone.  No orders of the defined types were placed in this encounter.    Follow-up: No Follow-up on file.  Walker Kehr, MD

## 2015-12-30 NOTE — Assessment & Plan Note (Addendum)
Mild Reduce NSAIDs

## 2015-12-30 NOTE — Assessment & Plan Note (Signed)
Mild dysphagia - pt declined GI ref Pepcid

## 2015-12-30 NOTE — Progress Notes (Signed)
Pre visit review using our clinic review tool, if applicable. No additional management support is needed unless otherwise documented below in the visit note. 

## 2016-01-04 ENCOUNTER — Telehealth: Payer: Self-pay | Admitting: Internal Medicine

## 2016-01-04 MED ORDER — ALLOPURINOL 100 MG PO TABS: 100.0000 mg | ORAL_TABLET | Freq: Every day | ORAL | 6 refills | Status: DC

## 2016-01-04 MED ORDER — COLCHICINE 0.6 MG PO TABS: 0.6000 mg | ORAL_TABLET | Freq: Two times a day (BID) | ORAL | 1 refills | Status: DC

## 2016-01-04 NOTE — Telephone Encounter (Signed)
Please follow up with patient.  Gout cleared up some and has now started back up.  Patient would like to know what med she needs to take.

## 2016-01-04 NOTE — Telephone Encounter (Signed)
Start Colchicine. In two days add Allopurinol. Rx's emailed Keep ROV Thx

## 2016-01-05 NOTE — Telephone Encounter (Signed)
Notified pt w/MD response. Pt want to know should she continue taking the prednisone. She states her foot is so painful she can hardly walk...Dawn Howard

## 2016-01-06 NOTE — Telephone Encounter (Signed)
Called pt no answer LMOM w/MD response../lmb 

## 2016-01-06 NOTE — Telephone Encounter (Signed)
Yes, take another round of Prednisone plus two other new meds Thx

## 2016-01-11 ENCOUNTER — Ambulatory Visit: Payer: Medicare Other | Admitting: Internal Medicine

## 2016-01-20 ENCOUNTER — Other Ambulatory Visit: Payer: Self-pay | Admitting: *Deleted

## 2016-01-20 MED ORDER — CARVEDILOL 12.5 MG PO TABS: 12.5000 mg | ORAL_TABLET | Freq: Two times a day (BID) | ORAL | 1 refills | Status: DC

## 2016-02-03 DIAGNOSIS — Z79891 Long term (current) use of opiate analgesic: Secondary | ICD-10-CM | POA: Diagnosis not present

## 2016-02-03 DIAGNOSIS — M50322 Other cervical disc degeneration at C5-C6 level: Secondary | ICD-10-CM | POA: Diagnosis not present

## 2016-02-03 DIAGNOSIS — M5136 Other intervertebral disc degeneration, lumbar region: Secondary | ICD-10-CM | POA: Diagnosis not present

## 2016-02-03 DIAGNOSIS — G894 Chronic pain syndrome: Secondary | ICD-10-CM | POA: Diagnosis not present

## 2016-02-14 ENCOUNTER — Encounter: Payer: Self-pay | Admitting: Internal Medicine

## 2016-02-14 ENCOUNTER — Ambulatory Visit (INDEPENDENT_AMBULATORY_CARE_PROVIDER_SITE_OTHER): Payer: Medicare Other | Admitting: Internal Medicine

## 2016-02-14 ENCOUNTER — Other Ambulatory Visit (INDEPENDENT_AMBULATORY_CARE_PROVIDER_SITE_OTHER): Payer: Medicare Other

## 2016-02-14 VITALS — BP 110/72 | HR 77 | Temp 98.1°F | Wt 167.0 lb

## 2016-02-14 DIAGNOSIS — Z23 Encounter for immunization: Secondary | ICD-10-CM | POA: Diagnosis not present

## 2016-02-14 DIAGNOSIS — N189 Chronic kidney disease, unspecified: Secondary | ICD-10-CM

## 2016-02-14 DIAGNOSIS — I10 Essential (primary) hypertension: Secondary | ICD-10-CM

## 2016-02-14 DIAGNOSIS — R601 Generalized edema: Secondary | ICD-10-CM

## 2016-02-14 DIAGNOSIS — G8929 Other chronic pain: Secondary | ICD-10-CM

## 2016-02-14 DIAGNOSIS — M25572 Pain in left ankle and joints of left foot: Secondary | ICD-10-CM | POA: Diagnosis not present

## 2016-02-14 DIAGNOSIS — M5440 Lumbago with sciatica, unspecified side: Secondary | ICD-10-CM

## 2016-02-14 LAB — URIC ACID: Uric Acid, Serum: 8.7 mg/dL — ABNORMAL HIGH (ref 2.4–7.0)

## 2016-02-14 LAB — BASIC METABOLIC PANEL
BUN: 19 mg/dL (ref 6–23)
CALCIUM: 10.2 mg/dL (ref 8.4–10.5)
CO2: 27 meq/L (ref 19–32)
Chloride: 105 mEq/L (ref 96–112)
Creatinine, Ser: 1.07 mg/dL (ref 0.40–1.20)
GFR: 54.27 mL/min — ABNORMAL LOW (ref >60.00)
GLUCOSE: 95 mg/dL (ref 70–99)
POTASSIUM: 4.5 meq/L (ref 3.5–5.1)
SODIUM: 138 meq/L (ref 135–145)

## 2016-02-14 MED ORDER — PNEUMOCOCCAL 13-VAL CONJ VACC IM SUSP
0.5000 mL | Freq: Once | INTRAMUSCULAR | Status: AC
  Administered 2016-02-14: 0.5 mL via INTRAMUSCULAR

## 2016-02-14 MED ORDER — ALPRAZOLAM 0.25 MG PO TABS: 0.2500 mg | ORAL_TABLET | Freq: Two times a day (BID) | ORAL | 3 refills | Status: DC

## 2016-02-14 MED ORDER — PNEUMOCOCCAL 13-VAL CONJ VACC IM SUSP: 0.5000 mL | INTRAMUSCULAR | Status: DC

## 2016-02-14 NOTE — Assessment & Plan Note (Signed)
Allopurinol  

## 2016-02-14 NOTE — Progress Notes (Signed)
Subjective:  Patient ID: Dawn Howard, female    DOB: 03-24-1951  Age: 65 y.o. MRN: [id number]  CC: No chief complaint on file.   HPI Dawn Howard presents for a L foot gout - better. C/o headaches with Oxycodone. F/u HTN.On Vicoprofen per Dr Nelva Bush  Outpatient Medications Prior to Visit  Medication Sig Dispense Refill  . allopurinol (ZYLOPRIM) 100 MG tablet Take 1 tablet (100 mg total) by mouth daily. 30 tablet 6  . ALPRAZolam (XANAX) 0.25 MG tablet Take 1 tablet (0.25 mg total) by mouth 2 (two) times daily. 60 tablet 3  . carvedilol (COREG) 12.5 MG tablet Take 1 tablet (12.5 mg total) by mouth 2 (two) times daily with a meal. 180 tablet 1  . colchicine 0.6 MG tablet Take 1 tablet (0.6 mg total) by mouth 2 (two) times daily. 60 tablet 1  . ergocalciferol (VITAMIN D2) 50000 units capsule Take 1 capsule (50,000 Units total) by mouth once a week. 6 capsule 0  . famotidine (PEPCID) 20 MG tablet Take 1 tablet (20 mg total) by mouth 2 (two) times daily. 60 tablet 5  . ibuprofen (ADVIL,MOTRIN) 600 MG tablet Take 1 tablet (600 mg total) by mouth every 8 (eight) hours as needed for headache or moderate pain. 60 tablet 3  . Multiple Vitamin (MULITIVITAMIN WITH MINERALS) TABS Take 1 tablet by mouth daily.    . predniSONE (DELTASONE) 10 MG tablet Take 40 mg on day 1; then 20 mg on day 2; and then 10 mg on days 3-4 prn gout. Take pc 40 tablet 1  . spironolactone (ALDACTONE) 50 MG tablet TAKE 1 TABLET BY MOUTH DAILY 30 tablet 11  . doxycycline (VIBRA-TABS) 100 MG tablet Take 1 tablet (100 mg total) by mouth 2 (two) times daily. 20 tablet 0  . oxyCODONE (ROXICODONE) 5 MG immediate release tablet Take 0.5-1 tablets (2.5-5 mg total) by mouth every 6 (six) hours as needed for severe pain. (Patient not taking: Reported on 02/14/2016) 60 tablet 0   No facility-administered medications prior to visit.     ROS Review of Systems  Constitutional: Negative for activity change, appetite change, chills,  fatigue and unexpected weight change.  HENT: Negative for congestion, mouth sores and sinus pressure.   Eyes: Negative for visual disturbance.  Respiratory: Negative for cough and chest tightness.   Gastrointestinal: Negative for abdominal pain and nausea.  Genitourinary: Negative for difficulty urinating, frequency and vaginal pain.  Musculoskeletal: Positive for arthralgias. Negative for back pain and gait problem.  Skin: Negative for pallor and rash.  Neurological: Negative for dizziness, tremors, weakness, numbness and headaches.  Psychiatric/Behavioral: Negative for confusion, sleep disturbance and suicidal ideas.    Objective:  BP 110/72   Pulse 77   Temp 98.1 F (36.7 C) (Oral)   Wt 167 lb (75.8 kg)   SpO2 97%   BMI 29.58 kg/m   BP Readings from Last 3 Encounters:  02/14/16 110/72  12/30/15 110/70  09/09/15 130/60    Wt Readings from Last 3 Encounters:  02/14/16 167 lb (75.8 kg)  12/30/15 168 lb (76.2 kg)  09/09/15 168 lb (76.2 kg)    Physical Exam  Constitutional: She appears well-developed. No distress.  HENT:  Head: Normocephalic.  Right Ear: External ear normal.  Left Ear: External ear normal.  Nose: Nose normal.  Mouth/Throat: Oropharynx is clear and moist.  Eyes: Conjunctivae are normal. Pupils are equal, round, and reactive to light. Right eye exhibits no discharge. Left eye exhibits no discharge.  Neck:  Normal range of motion. Neck supple. No JVD present. No tracheal deviation present. No thyromegaly present.  Cardiovascular: Normal rate, regular rhythm and normal heart sounds.   Pulmonary/Chest: No stridor. No respiratory distress. She has no wheezes.  Abdominal: Soft. Bowel sounds are normal. She exhibits no distension and no mass. There is no tenderness. There is no rebound and no guarding.  Musculoskeletal: She exhibits no edema or tenderness.  Lymphadenopathy:    She has no cervical adenopathy.  Neurological: She displays normal reflexes. No  cranial nerve deficit. She exhibits normal muscle tone. Coordination normal.  Skin: No rash noted. No erythema.  Psychiatric: She has a normal mood and affect. Her behavior is normal. Judgment and thought content normal.    Lab Results  Component Value Date   WBC 10.9 (H) 06/08/2015   HGB 15.6 (H) 06/08/2015   HCT 46.2 (H) 06/08/2015   PLT 283.0 06/08/2015   GLUCOSE 90 12/30/2015   CHOL 226 (H) 06/08/2015   TRIG 152.0 (H) 06/08/2015   HDL 53.80 06/08/2015   LDLDIRECT 145.3 01/27/2013   LDLCALC 142 (H) 06/08/2015   ALT 26 12/30/2015   AST 17 12/30/2015   NA 138 12/30/2015   K 4.2 12/30/2015   CL 104 12/30/2015   CREATININE 1.14 12/30/2015   BUN 31 (H) 12/30/2015   CO2 28 12/30/2015   TSH 1.71 06/08/2015    Mm Digital Screening Bilateral  Result Date: 10/15/2015 CLINICAL DATA:  Screening. EXAM: DIGITAL SCREENING BILATERAL MAMMOGRAM WITH CAD COMPARISON:  None. ACR Breast Density Category b: There are scattered areas of fibroglandular density. FINDINGS: There are no findings suspicious for malignancy. Images were processed with CAD. IMPRESSION: No mammographic evidence of malignancy. A result letter of this screening mammogram will be mailed directly to the patient. RECOMMENDATION: Screening mammogram in one year. (Code:SM-B-01Y) BI-RADS CATEGORY  1: Negative. Electronically Signed   By: Abelardo Diesel M.D.   On: 10/15/2015 12:16    Assessment & Plan:   There are no diagnoses linked to this encounter. I have discontinued [name]'s doxycycline. I am also having her maintain her multivitamin with minerals, ibuprofen, ALPRAZolam, ergocalciferol, spironolactone, famotidine, predniSONE, oxyCODONE, allopurinol, colchicine, carvedilol, and HYDROcodone-ibuprofen.  Meds ordered this encounter  Medications  . HYDROcodone-ibuprofen (VICOPROFEN) 7.5-200 MG tablet    Sig: Take 1 tablet by mouth 2 (two) times daily.     Follow-up: No Follow-up on file.  Walker Kehr, MD

## 2016-02-14 NOTE — Assessment & Plan Note (Signed)
Labs Careful with NSAIDs

## 2016-02-14 NOTE — Assessment & Plan Note (Signed)
Vicoprofen per Dr Nelva Bush w/caution

## 2016-02-14 NOTE — Assessment & Plan Note (Signed)
Spironolactone

## 2016-02-14 NOTE — Assessment & Plan Note (Signed)
On Coreg, Spironolactone 

## 2016-02-14 NOTE — Progress Notes (Signed)
Pre visit review using our clinic review tool, if applicable. No additional management support is needed unless otherwise documented below in the visit note. 

## 2016-02-14 NOTE — Addendum Note (Signed)
Addended by: Elta Guadeloupe on: 02/14/2016 12:02 PM   Modules accepted: Orders

## 2016-05-04 DIAGNOSIS — Z79891 Long term (current) use of opiate analgesic: Secondary | ICD-10-CM | POA: Diagnosis not present

## 2016-05-04 DIAGNOSIS — G894 Chronic pain syndrome: Secondary | ICD-10-CM | POA: Diagnosis not present

## 2016-05-04 DIAGNOSIS — M50322 Other cervical disc degeneration at C5-C6 level: Secondary | ICD-10-CM | POA: Diagnosis not present

## 2016-05-29 DIAGNOSIS — H43393 Other vitreous opacities, bilateral: Secondary | ICD-10-CM | POA: Diagnosis not present

## 2016-05-29 DIAGNOSIS — Z961 Presence of intraocular lens: Secondary | ICD-10-CM | POA: Diagnosis not present

## 2016-05-29 DIAGNOSIS — H17821 Peripheral opacity of cornea, right eye: Secondary | ICD-10-CM | POA: Diagnosis not present

## 2016-06-21 ENCOUNTER — Other Ambulatory Visit (INDEPENDENT_AMBULATORY_CARE_PROVIDER_SITE_OTHER): Payer: Medicare Other

## 2016-06-21 ENCOUNTER — Ambulatory Visit (INDEPENDENT_AMBULATORY_CARE_PROVIDER_SITE_OTHER): Payer: Medicare Other | Admitting: Internal Medicine

## 2016-06-21 ENCOUNTER — Encounter: Payer: Self-pay | Admitting: Internal Medicine

## 2016-06-21 VITALS — BP 116/76 | HR 89 | Temp 98.2°F | Resp 16 | Ht 63.0 in | Wt 171.0 lb

## 2016-06-21 DIAGNOSIS — R202 Paresthesia of skin: Secondary | ICD-10-CM

## 2016-06-21 DIAGNOSIS — E876 Hypokalemia: Secondary | ICD-10-CM

## 2016-06-21 DIAGNOSIS — R42 Dizziness and giddiness: Secondary | ICD-10-CM

## 2016-06-21 DIAGNOSIS — N189 Chronic kidney disease, unspecified: Secondary | ICD-10-CM

## 2016-06-21 LAB — URINALYSIS
Bilirubin Urine: NEGATIVE
KETONES UR: NEGATIVE
Leukocytes, UA: NEGATIVE
Nitrite: NEGATIVE
Specific Gravity, Urine: 1.02 (ref 1.000–1.030)
TOTAL PROTEIN, URINE-UPE24: NEGATIVE
URINE GLUCOSE: NEGATIVE
Urobilinogen, UA: 0.2 (ref 0.0–1.0)
pH: 6 (ref 5.0–8.0)

## 2016-06-21 LAB — BASIC METABOLIC PANEL
BUN: 29 mg/dL — AB (ref 6–23)
CALCIUM: 10.3 mg/dL (ref 8.4–10.5)
CO2: 26 meq/L (ref 19–32)
Chloride: 105 mEq/L (ref 96–112)
Creatinine, Ser: 1.08 mg/dL (ref 0.40–1.20)
GFR: 53.64 mL/min — ABNORMAL LOW (ref >60.00)
GLUCOSE: 89 mg/dL (ref 70–99)
Potassium: 4.2 mEq/L (ref 3.5–5.1)
SODIUM: 137 meq/L (ref 135–145)

## 2016-06-21 LAB — CBC WITH DIFFERENTIAL/PLATELET
Basophils Absolute: 0.1 10*3/uL (ref 0.0–0.1)
Basophils Relative: 0.8 % (ref 0.0–3.0)
Eosinophils Absolute: 0.2 10*3/uL (ref 0.0–0.7)
Eosinophils Relative: 2.7 % (ref 0.0–5.0)
HCT: 44.1 % (ref 36.0–46.0)
Hemoglobin: 15.2 g/dL — ABNORMAL HIGH (ref 12.0–15.0)
LYMPHS ABS: 1.9 10*3/uL (ref 0.7–4.0)
Lymphocytes Relative: 26 % (ref 12.0–46.0)
MCHC: 34.6 g/dL (ref 30.0–36.0)
MCV: 88.4 fl (ref 78.0–100.0)
MONO ABS: 0.6 10*3/uL (ref 0.1–1.0)
MONOS PCT: 8.8 % (ref 3.0–12.0)
NEUTROS ABS: 4.4 10*3/uL (ref 1.4–7.7)
NEUTROS PCT: 61.7 % (ref 43.0–77.0)
PLATELETS: 273 10*3/uL (ref 150.0–400.0)
RBC: 4.99 Mil/uL (ref 3.87–5.11)
RDW: 13.3 % (ref 11.5–15.5)
WBC: 7.2 10*3/uL (ref 4.0–10.5)

## 2016-06-21 LAB — VITAMIN B12: VITAMIN B 12: 335 pg/mL (ref 211–911)

## 2016-06-21 LAB — TSH: TSH: 1.9 u[IU]/mL (ref 0.35–4.50)

## 2016-06-21 MED ORDER — OSELTAMIVIR PHOSPHATE 75 MG PO CAPS: 75.0000 mg | ORAL_CAPSULE | Freq: Two times a day (BID) | ORAL | 0 refills | Status: DC

## 2016-06-21 NOTE — Assessment & Plan Note (Signed)
Labs

## 2016-06-21 NOTE — Assessment & Plan Note (Addendum)
Recurrent EKG Ortho BP Labs

## 2016-06-21 NOTE — Progress Notes (Signed)
Pre-visit discussion using our clinic review tool. No additional management support is needed unless otherwise documented below in the visit note.  

## 2016-06-21 NOTE — Progress Notes (Signed)
Subjective:  Patient ID: Dawn Howard, female    DOB: [birth year]  Age: 66 y.o. MRN: [id number]  CC: Follow-up (OA, faiting, HTN, LBP, edema, )   HPI Dawn Howard presents for vertigo attacks. She has had remote severe vertigo. She is worried  Outpatient Medications Prior to Visit  Medication Sig Dispense Refill  . allopurinol (ZYLOPRIM) 100 MG tablet Take 1 tablet (100 mg total) by mouth daily. (Patient taking differently: Take 100 mg by mouth daily. 1/2 tablet) 30 tablet 6  . ALPRAZolam (XANAX) 0.25 MG tablet Take 1 tablet (0.25 mg total) by mouth 2 (two) times daily. 60 tablet 3  . carvedilol (COREG) 12.5 MG tablet Take 1 tablet (12.5 mg total) by mouth 2 (two) times daily with a meal. 180 tablet 1  . colchicine 0.6 MG tablet Take 1 tablet (0.6 mg total) by mouth 2 (two) times daily. 60 tablet 1  . ergocalciferol (VITAMIN D2) 50000 units capsule Take 1 capsule (50,000 Units total) by mouth once a week. 6 capsule 0  . famotidine (PEPCID) 20 MG tablet Take 1 tablet (20 mg total) by mouth 2 (two) times daily. 60 tablet 5  . HYDROcodone-ibuprofen (VICOPROFEN) 7.5-200 MG tablet Take 1 tablet by mouth 2 (two) times daily.    . Multiple Vitamin (MULITIVITAMIN WITH MINERALS) TABS Take 1 tablet by mouth daily.    Marland Kitchen spironolactone (ALDACTONE) 50 MG tablet TAKE 1 TABLET BY MOUTH DAILY 30 tablet 11  . predniSONE (DELTASONE) 10 MG tablet Take 40 mg on day 1; then 20 mg on day 2; and then 10 mg on days 3-4 prn gout. Take pc (Patient not taking: Reported on 06/21/2016) 40 tablet 1   No facility-administered medications prior to visit.     ROS Review of Systems  Constitutional: Negative for activity change, appetite change, chills, fatigue and unexpected weight change.  HENT: Negative for congestion, mouth sores and sinus pressure.   Eyes: Negative for visual disturbance.  Respiratory: Negative for cough and chest tightness.   Gastrointestinal: Negative for abdominal pain and nausea.    Genitourinary: Negative for difficulty urinating, frequency and vaginal pain.  Musculoskeletal: Negative for back pain and gait problem.  Skin: Negative for pallor and rash.  Neurological: Positive for dizziness and light-headedness. Negative for tremors, seizures, weakness, numbness and headaches.  Psychiatric/Behavioral: Negative for confusion and sleep disturbance.   H-P (-) B  Procedure: EKG Indication: vertigo, dizziness Impression: NSR. No new changes.  Objective:  BP 116/76   Pulse 89   Temp 98.2 F (36.8 C) (Oral)   Resp 16   Ht 5\' 3"  (1.6 m)   Wt 171 lb (77.6 kg)   SpO2 98%   BMI 30.29 kg/m   BP Readings from Last 3 Encounters:  06/21/16 116/76  02/14/16 110/72  12/30/15 110/70    Wt Readings from Last 3 Encounters:  06/21/16 171 lb (77.6 kg)  02/14/16 167 lb (75.8 kg)  12/30/15 168 lb (76.2 kg)    Physical Exam  Constitutional: She appears well-developed. No distress.  HENT:  Head: Normocephalic.  Right Ear: External ear normal.  Left Ear: External ear normal.  Nose: Nose normal.  Mouth/Throat: Oropharynx is clear and moist.  Eyes: Conjunctivae are normal. Pupils are equal, round, and reactive to light. Right eye exhibits no discharge. Left eye exhibits no discharge.  Neck: Normal range of motion. Neck supple. No JVD present. No tracheal deviation present. No thyromegaly present.  Cardiovascular: Normal rate, regular rhythm and normal heart sounds.  Pulmonary/Chest: No stridor. No respiratory distress. She has no wheezes.  Abdominal: Soft. Bowel sounds are normal. She exhibits no distension and no mass. There is no tenderness. There is no rebound and no guarding.  Musculoskeletal: She exhibits no edema or tenderness.  Lymphadenopathy:    She has no cervical adenopathy.  Neurological: She displays normal reflexes. No cranial nerve deficit. She exhibits normal muscle tone. Coordination normal.  Skin: No rash noted. No erythema.  Psychiatric: She has a  normal mood and affect. Her behavior is normal. Judgment and thought content normal.    Lab Results  Component Value Date   WBC 10.9 (H) 06/08/2015   HGB 15.6 (H) 06/08/2015   HCT 46.2 (H) 06/08/2015   PLT 283.0 06/08/2015   GLUCOSE 95 02/14/2016   CHOL 226 (H) 06/08/2015   TRIG 152.0 (H) 06/08/2015   HDL 53.80 06/08/2015   LDLDIRECT 145.3 01/27/2013   LDLCALC 142 (H) 06/08/2015   ALT 26 12/30/2015   AST 17 12/30/2015   NA 138 02/14/2016   K 4.5 02/14/2016   CL 105 02/14/2016   CREATININE 1.07 02/14/2016   BUN 19 02/14/2016   CO2 27 02/14/2016   TSH 1.71 06/08/2015    Mm Digital Screening Bilateral  Result Date: 10/14/2015 CLINICAL DATA:  Screening. EXAM: DIGITAL SCREENING BILATERAL MAMMOGRAM WITH CAD COMPARISON:  None. ACR Breast Density Category b: There are scattered areas of fibroglandular density. FINDINGS: There are no findings suspicious for malignancy. Images were processed with CAD. IMPRESSION: No mammographic evidence of malignancy. A result letter of this screening mammogram will be mailed directly to the patient. RECOMMENDATION: Screening mammogram in one year. (Code:SM-B-01Y) BI-RADS CATEGORY  1: Negative. Electronically Signed   By: Abelardo Diesel M.D.   On: 10/15/2015 12:16    Assessment & Plan:   There are no diagnoses linked to this encounter. I am having Dawn Howard maintain her multivitamin with minerals, ergocalciferol, spironolactone, famotidine, predniSONE, allopurinol, colchicine, carvedilol, ALPRAZolam, and HYDROcodone-ibuprofen.  No orders of the defined types were placed in this encounter.    Follow-up: No Follow-up on file.  Walker Kehr, MD

## 2016-06-22 ENCOUNTER — Telehealth: Payer: Self-pay

## 2016-06-22 NOTE — Telephone Encounter (Signed)
-----   Message from Cassandria Anger, MD sent at 06/21/2016 11:27 PM EST ----- Jonelle Sidle,  Please inform patient that all labs are OK. She needs to drink more liquids Thx

## 2016-06-22 NOTE — Telephone Encounter (Signed)
Routing to javier, please handle, thanks 

## 2016-06-22 NOTE — Telephone Encounter (Signed)
Lmom for patient to call back 

## 2016-07-11 ENCOUNTER — Other Ambulatory Visit: Payer: Self-pay | Admitting: *Deleted

## 2016-07-11 MED ORDER — SPIRONOLACTONE 50 MG PO TABS: 50.0000 mg | ORAL_TABLET | Freq: Every day | ORAL | 5 refills | Status: DC

## 2016-07-11 NOTE — Telephone Encounter (Signed)
Rec'd call pt requesting refill on her spironolactone. Sent electronically to [facility]...Dawn Howard

## 2016-08-17 ENCOUNTER — Telehealth: Payer: Self-pay | Admitting: Internal Medicine

## 2016-08-17 NOTE — Telephone Encounter (Signed)
Called patient to schedule awv. Lvm for patient to call office to schedule appt.  °

## 2016-08-19 DIAGNOSIS — M5136 Other intervertebral disc degeneration, lumbar region: Secondary | ICD-10-CM | POA: Diagnosis not present

## 2016-08-19 DIAGNOSIS — Z79891 Long term (current) use of opiate analgesic: Secondary | ICD-10-CM | POA: Diagnosis not present

## 2016-08-19 DIAGNOSIS — M50322 Other cervical disc degeneration at C5-C6 level: Secondary | ICD-10-CM | POA: Diagnosis not present

## 2016-08-19 DIAGNOSIS — G894 Chronic pain syndrome: Secondary | ICD-10-CM | POA: Diagnosis not present

## 2016-09-20 ENCOUNTER — Telehealth: Payer: Self-pay | Admitting: Internal Medicine

## 2016-09-20 NOTE — Telephone Encounter (Signed)
Pt called and said that she has poison ivy or poison oak on her neck, fore head, behind ears and thumb. She is very susceptible to getting this and has been using Caladryl Clear but it does not seem to be helping. Is there anything that we can do for her? She is having to watch her grandkids today so she will not be able to come in but is concerned about it spreading to them. Please advise.

## 2016-09-21 NOTE — Telephone Encounter (Signed)
Use OTC hydrocortisone cream and PO benadryl OV on Fri w/any provider Thx

## 2016-09-22 ENCOUNTER — Ambulatory Visit: Payer: Medicare Other | Admitting: Internal Medicine

## 2016-09-22 NOTE — Telephone Encounter (Signed)
Pt contacted and has an appt on Tuesday.  Pt stated that she is feeling much better and the places that she broke out are looking much better too.

## 2016-09-23 DIAGNOSIS — M50322 Other cervical disc degeneration at C5-C6 level: Secondary | ICD-10-CM | POA: Diagnosis not present

## 2016-09-23 DIAGNOSIS — M5136 Other intervertebral disc degeneration, lumbar region: Secondary | ICD-10-CM | POA: Diagnosis not present

## 2016-09-26 ENCOUNTER — Encounter: Payer: Self-pay | Admitting: Nurse Practitioner

## 2016-09-26 ENCOUNTER — Ambulatory Visit (INDEPENDENT_AMBULATORY_CARE_PROVIDER_SITE_OTHER): Payer: Medicare Other | Admitting: Nurse Practitioner

## 2016-09-26 ENCOUNTER — Ambulatory Visit: Payer: Medicare Other | Admitting: Internal Medicine

## 2016-09-26 VITALS — BP 118/84 | HR 80 | Temp 97.6°F | Ht 63.0 in | Wt 170.0 lb

## 2016-09-26 DIAGNOSIS — L247 Irritant contact dermatitis due to plants, except food: Secondary | ICD-10-CM | POA: Diagnosis not present

## 2016-09-26 MED ORDER — TRIAMCINOLONE ACETONIDE 0.5 % EX OINT: 1.0000 "application " | TOPICAL_OINTMENT | Freq: Two times a day (BID) | CUTANEOUS | 0 refills | Status: DC

## 2016-09-26 NOTE — Patient Instructions (Addendum)
Do not use topical steriod on face for more than 5-7days.  Patient declined oral prednisone and depomedrol IM.  Poison Ivy Dermatitis Poison ivy dermatitis is redness and soreness (inflammation) of the skin. It is caused by a chemical that is found on the leaves of the poison ivy plant. You may also have itching, a rash, and blisters. Symptoms often clear up in 1-2 weeks. You may get this condition by touching a poison ivy plant. You can also get it by touching something that has the chemical on it. This may include animals or objects that have come in contact with the plant. Follow these instructions at home: General instructions   Take or apply over-the-counter and prescription medicines only as told by your doctor.  If you touch poison ivy, wash your skin with soap and cold water right away.  Use hydrocortisone creams or calamine lotion as needed to help with itching.  Take oatmeal baths as needed. Use colloidal oatmeal. You can get this at a pharmacy or grocery store. Follow the instructions on the package.  Do not scratch or rub your skin.  While you have the rash, wash your clothes right after you wear them. Prevention   Know what poison ivy looks like so you can avoid it. This plant has three leaves with flowering branches on a single stem. The leaves are glossy. They have uneven edges that come to a point at the front.  If you have touched poison ivy, wash with soap and water right away. Be sure to wash under your fingernails.  When hiking or camping, wear long pants, a long-sleeved shirt, tall socks, and hiking boots. You can also use a lotion on your skin that helps to prevent contact with the chemical on the plant.  If you think that your clothes or outdoor gear came in contact with poison ivy, rinse them off with a garden hose before you bring them inside your house. Contact a doctor if:  You have open sores in the rash area.  You have more redness, swelling, or pain in  the affected area.  You have redness that spreads beyond the rash area.  You have fluid, blood, or pus coming from the affected area.  You have a fever.  You have a rash over a large area of your body.  You have a rash on your eyes, mouth, or genitals.  Your rash does not get better after a few days. Get help right away if:  Your face swells or your eyes swell shut.  You have trouble breathing.  You have trouble swallowing. This information is not intended to replace advice given to you by your health care provider. Make sure you discuss any questions you have with your health care provider. Document Released: 05/27/2010 Document Revised: 09/30/2015 Document Reviewed: 09/30/2014 Elsevier Interactive Patient Education  2017 [employer].

## 2016-09-26 NOTE — Progress Notes (Signed)
Subjective:  Patient ID: Dawn Howard, female    DOB: 26-Nov-1950  Age: 66 y.o. MRN: [id number]  CC: Poison Ivy (poison ivy,breaking out 1 wk. )   Rash  This is a new problem. The current episode started in the past 7 days. The problem has been gradually worsening since onset. The affected locations include the face, neck, right arm and left arm. The rash is characterized by itchiness and redness. She was exposed to plant contact. Pertinent negatives include no congestion, eye pain, facial edema, fatigue, fever or sore throat. Past treatments include anti-itch cream and antihistamine. The treatment provided mild relief.    Outpatient Medications Prior to Visit  Medication Sig Dispense Refill  . allopurinol (ZYLOPRIM) 100 MG tablet Take 1 tablet (100 mg total) by mouth daily. (Patient taking differently: Take 100 mg by mouth daily. 1/2 tablet) 30 tablet 6  . ALPRAZolam (XANAX) 0.25 MG tablet Take 1 tablet (0.25 mg total) by mouth 2 (two) times daily. 60 tablet 3  . carvedilol (COREG) 12.5 MG tablet Take 1 tablet (12.5 mg total) by mouth 2 (two) times daily with a meal. 180 tablet 1  . colchicine 0.6 MG tablet Take 1 tablet (0.6 mg total) by mouth 2 (two) times daily. 60 tablet 1  . ergocalciferol (VITAMIN D2) 50000 units capsule Take 1 capsule (50,000 Units total) by mouth once a week. 6 capsule 0  . famotidine (PEPCID) 20 MG tablet Take 1 tablet (20 mg total) by mouth 2 (two) times daily. 60 tablet 5  . HYDROcodone-ibuprofen (VICOPROFEN) 7.5-200 MG tablet Take 1 tablet by mouth 2 (two) times daily.    . Multiple Vitamin (MULITIVITAMIN WITH MINERALS) TABS Take 1 tablet by mouth daily.    Marland Kitchen oseltamivir (TAMIFLU) 75 MG capsule Take 1 capsule (75 mg total) by mouth 2 (two) times daily. 10 capsule 0  . predniSONE (DELTASONE) 10 MG tablet Take 40 mg on day 1; then 20 mg on day 2; and then 10 mg on days 3-4 prn gout. Take pc 40 tablet 1  . spironolactone (ALDACTONE) 50 MG tablet Take 1 tablet (50  mg total) by mouth daily. 30 tablet 5   No facility-administered medications prior to visit.     ROS See HPI  Objective:  BP 118/84   Pulse 80   Temp 97.6 F (36.4 C)   Ht 5\' 3"  (1.6 m)   Wt 170 lb (77.1 kg)   SpO2 97%   BMI 30.11 kg/m   BP Readings from Last 3 Encounters:  09/26/16 118/84  06/21/16 116/76  02/14/16 110/72    Wt Readings from Last 3 Encounters:  09/26/16 170 lb (77.1 kg)  06/21/16 171 lb (77.6 kg)  02/14/16 167 lb (75.8 kg)    Physical Exam  Constitutional: She is oriented to person, place, and time. No distress.  HENT:  Head:    Right Ear: External ear normal.  Left Ear: External ear normal.  Nose: Nose normal.  Eyes: Conjunctivae and EOM are normal. Pupils are equal, round, and reactive to light. Right eye exhibits no discharge. No scleral icterus.  Neck: Normal range of motion. Neck supple.  Cardiovascular: Normal rate.   Pulmonary/Chest: Effort normal.  Musculoskeletal: She exhibits no edema or tenderness.  Neurological: She is alert and oriented to person, place, and time.  Skin: Skin is dry. Rash noted. Rash is maculopapular. There is erythema.     Vitals reviewed.   Lab Results  Component Value Date   WBC 7.2 06/21/2016  HGB 15.2 (H) 06/21/2016   HCT 44.1 06/21/2016   PLT 273.0 06/21/2016   GLUCOSE 89 06/21/2016   CHOL 226 (H) 06/08/2015   TRIG 152.0 (H) 06/08/2015   HDL 53.80 06/08/2015   LDLDIRECT 145.3 01/27/2013   LDLCALC 142 (H) 06/08/2015   ALT 26 12/30/2015   AST 17 12/30/2015   NA 137 06/21/2016   K 4.2 06/21/2016   CL 105 06/21/2016   CREATININE 1.08 06/21/2016   BUN 29 (H) 06/21/2016   CO2 26 06/21/2016   TSH 1.90 06/21/2016    Mm Digital Screening Bilateral  Result Date: 10/14/2015 CLINICAL DATA:  Screening. EXAM: DIGITAL SCREENING BILATERAL MAMMOGRAM WITH CAD COMPARISON:  None. ACR Breast Density Category b: There are scattered areas of fibroglandular density. FINDINGS: There are no findings suspicious  for malignancy. Images were processed with CAD. IMPRESSION: No mammographic evidence of malignancy. A result letter of this screening mammogram will be mailed directly to the patient. RECOMMENDATION: Screening mammogram in one year. (Code:SM-B-01Y) BI-RADS CATEGORY  1: Negative. Electronically Signed   By: Abelardo Diesel M.D.   On: 10/15/2015 12:16    Assessment & Plan:   Dawn Howard was seen today for poison ivy.  Diagnoses and all orders for this visit:  Irritant contact dermatitis due to plants, except food -     triamcinolone ointment (KENALOG) 0.5 %; Apply 1 application topically 2 (two) times daily.   I am having Dawn Howard start on triamcinolone ointment. I am also having her maintain her multivitamin with minerals, ergocalciferol, famotidine, predniSONE, allopurinol, colchicine, carvedilol, ALPRAZolam, HYDROcodone-ibuprofen, oseltamivir, and spironolactone.  Meds ordered this encounter  Medications  . triamcinolone ointment (KENALOG) 0.5 %    Sig: Apply 1 application topically 2 (two) times daily.    Dispense:  30 g    Refill:  0    Order Specific Question:   Supervising Provider    Answer:   Cassandria Anger [[id number]]   Follow-up: Return if symptoms worsen or fail to improve.  Wilfred Lacy, NP

## 2016-10-03 ENCOUNTER — Ambulatory Visit (INDEPENDENT_AMBULATORY_CARE_PROVIDER_SITE_OTHER): Payer: Medicare Other | Admitting: Internal Medicine

## 2016-10-03 ENCOUNTER — Encounter: Payer: Self-pay | Admitting: Internal Medicine

## 2016-10-03 DIAGNOSIS — G4700 Insomnia, unspecified: Secondary | ICD-10-CM

## 2016-10-03 DIAGNOSIS — E559 Vitamin D deficiency, unspecified: Secondary | ICD-10-CM

## 2016-10-03 DIAGNOSIS — I10 Essential (primary) hypertension: Secondary | ICD-10-CM

## 2016-10-03 DIAGNOSIS — G8929 Other chronic pain: Secondary | ICD-10-CM

## 2016-10-03 DIAGNOSIS — J4520 Mild intermittent asthma, uncomplicated: Secondary | ICD-10-CM

## 2016-10-03 DIAGNOSIS — F419 Anxiety disorder, unspecified: Secondary | ICD-10-CM

## 2016-10-03 DIAGNOSIS — M5440 Lumbago with sciatica, unspecified side: Secondary | ICD-10-CM | POA: Diagnosis not present

## 2016-10-03 DIAGNOSIS — M109 Gout, unspecified: Secondary | ICD-10-CM | POA: Diagnosis not present

## 2016-10-03 DIAGNOSIS — N189 Chronic kidney disease, unspecified: Secondary | ICD-10-CM

## 2016-10-03 MED ORDER — ZOSTER VAC RECOMB ADJUVANTED 50 MCG/0.5ML IM SUSR: 0.5000 mL | Freq: Once | INTRAMUSCULAR | 1 refills | Status: AC

## 2016-10-03 NOTE — Patient Instructions (Signed)
MC well w/Jill 

## 2016-10-03 NOTE — Assessment & Plan Note (Signed)
Xanax prn - very rare use 

## 2016-10-03 NOTE — Assessment & Plan Note (Addendum)
Pt had a CT He will ref her to a back surgeon  Dr Nelva Bush MS[facility] Norco prn  Potential benefits of a long term opioids use as well as potential risks (i.e. addiction risk, apnea etc) and complications (i.e. Somnolence, constipation and others) were explained to the patient and were aknowledged.

## 2016-10-03 NOTE — Progress Notes (Signed)
Subjective:  Patient ID: Dawn Howard, female    DOB: 30-Mar-1951  Age: 66 y.o. MRN: [id number]  CC: No chief complaint on file.   HPI Dawn Howard presents for LBP, allergies, anxiety f/u  Outpatient Medications Prior to Visit  Medication Sig Dispense Refill  . allopurinol (ZYLOPRIM) 100 MG tablet Take 1 tablet (100 mg total) by mouth daily. (Patient taking differently: Take 100 mg by mouth daily. 1/2 tablet) 30 tablet 6  . ALPRAZolam (XANAX) 0.25 MG tablet Take 1 tablet (0.25 mg total) by mouth 2 (two) times daily. 60 tablet 3  . carvedilol (COREG) 12.5 MG tablet Take 1 tablet (12.5 mg total) by mouth 2 (two) times daily with a meal. 180 tablet 1  . colchicine 0.6 MG tablet Take 1 tablet (0.6 mg total) by mouth 2 (two) times daily. 60 tablet 1  . ergocalciferol (VITAMIN D2) 50000 units capsule Take 1 capsule (50,000 Units total) by mouth once a week. 6 capsule 0  . famotidine (PEPCID) 20 MG tablet Take 1 tablet (20 mg total) by mouth 2 (two) times daily. 60 tablet 5  . HYDROcodone-ibuprofen (VICOPROFEN) 7.5-200 MG tablet Take 1 tablet by mouth 2 (two) times daily.    . Multiple Vitamin (MULITIVITAMIN WITH MINERALS) TABS Take 1 tablet by mouth daily.    . predniSONE (DELTASONE) 10 MG tablet Take 40 mg on day 1; then 20 mg on day 2; and then 10 mg on days 3-4 prn gout. Take pc 40 tablet 1  . spironolactone (ALDACTONE) 50 MG tablet Take 1 tablet (50 mg total) by mouth daily. 30 tablet 5  . triamcinolone ointment (KENALOG) 0.5 % Apply 1 application topically 2 (two) times daily. 30 g 0  . oseltamivir (TAMIFLU) 75 MG capsule Take 1 capsule (75 mg total) by mouth 2 (two) times daily. 10 capsule 0   No facility-administered medications prior to visit.     ROS Review of Systems  Constitutional: Negative for activity change, appetite change, chills, fatigue and unexpected weight change.  HENT: Negative for congestion, mouth sores and sinus pressure.   Eyes: Negative for visual  disturbance.  Respiratory: Negative for cough and chest tightness.   Gastrointestinal: Negative for abdominal pain and nausea.  Genitourinary: Negative for difficulty urinating, frequency and vaginal pain.  Musculoskeletal: Positive for arthralgias and back pain. Negative for gait problem.  Skin: Negative for pallor and rash.  Neurological: Negative for dizziness, tremors, weakness, numbness and headaches.  Psychiatric/Behavioral: Negative for confusion and sleep disturbance.    Objective:  BP 122/84 (BP Location: Left Arm, Patient Position: Sitting, Cuff Size: Normal)   Pulse 79   Temp 98.1 F (36.7 C) (Oral)   Ht 5\' 3"  (1.6 m)   Wt 173 lb (78.5 kg)   SpO2 98%   BMI 30.65 kg/m   BP Readings from Last 3 Encounters:  10/03/16 122/84  09/26/16 118/84  06/21/16 116/76    Wt Readings from Last 3 Encounters:  10/03/16 173 lb (78.5 kg)  09/26/16 170 lb (77.1 kg)  06/21/16 171 lb (77.6 kg)    Physical Exam  Constitutional: She appears well-developed. No distress.  HENT:  Head: Normocephalic.  Right Ear: External ear normal.  Left Ear: External ear normal.  Nose: Nose normal.  Mouth/Throat: Oropharynx is clear and moist.  Eyes: Conjunctivae are normal. Pupils are equal, round, and reactive to light. Right eye exhibits no discharge. Left eye exhibits no discharge.  Neck: Normal range of motion. Neck supple. No JVD present. No tracheal  deviation present. No thyromegaly present.  Cardiovascular: Normal rate, regular rhythm and normal heart sounds.   Pulmonary/Chest: No stridor. No respiratory distress. She has no wheezes.  Abdominal: Soft. Bowel sounds are normal. She exhibits no distension and no mass. There is no tenderness. There is no rebound and no guarding.  Musculoskeletal: She exhibits tenderness. She exhibits no edema.  Lymphadenopathy:    She has no cervical adenopathy.  Neurological: She displays normal reflexes. No cranial nerve deficit. She exhibits normal muscle  tone. Coordination normal.  Skin: No rash noted. No erythema.  Psychiatric: She has a normal mood and affect. Her behavior is normal. Judgment and thought content normal.  resolving rash  Lab Results  Component Value Date   WBC 7.2 06/21/2016   HGB 15.2 (H) 06/21/2016   HCT 44.1 06/21/2016   PLT 273.0 06/21/2016   GLUCOSE 89 06/21/2016   CHOL 226 (H) 06/08/2015   TRIG 152.0 (H) 06/08/2015   HDL 53.80 06/08/2015   LDLDIRECT 145.3 01/27/2013   LDLCALC 142 (H) 06/08/2015   ALT 26 12/30/2015   AST 17 12/30/2015   NA 137 06/21/2016   K 4.2 06/21/2016   CL 105 06/21/2016   CREATININE 1.08 06/21/2016   BUN 29 (H) 06/21/2016   CO2 26 06/21/2016   TSH 1.90 06/21/2016    Mm Digital Screening Bilateral  Result Date: 10/14/2015 CLINICAL DATA:  Screening. EXAM: DIGITAL SCREENING BILATERAL MAMMOGRAM WITH CAD COMPARISON:  None. ACR Breast Density Category b: There are scattered areas of fibroglandular density. FINDINGS: There are no findings suspicious for malignancy. Images were processed with CAD. IMPRESSION: No mammographic evidence of malignancy. A result letter of this screening mammogram will be mailed directly to the patient. RECOMMENDATION: Screening mammogram in one year. (Code:SM-B-01Y) BI-RADS CATEGORY  1: Negative. Electronically Signed   By: Abelardo Diesel M.D.   On: 10/15/2015 12:16    Assessment & Plan:   There are no diagnoses linked to this encounter. I have discontinued [name]'s oseltamivir. I am also having her maintain her multivitamin with minerals, ergocalciferol, famotidine, predniSONE, allopurinol, colchicine, carvedilol, ALPRAZolam, HYDROcodone-ibuprofen, spironolactone, and triamcinolone ointment.  No orders of the defined types were placed in this encounter.    Follow-up: No Follow-up on file.  Walker Kehr, MD

## 2016-10-03 NOTE — Assessment & Plan Note (Signed)
On Vit D 

## 2016-10-03 NOTE — Assessment & Plan Note (Signed)
Not on rx 

## 2016-10-03 NOTE — Assessment & Plan Note (Signed)
On Coreg, Spironolactone 

## 2016-10-03 NOTE — Assessment & Plan Note (Signed)
Doing well 

## 2016-10-03 NOTE — Assessment & Plan Note (Signed)
On allopurinol

## 2016-10-03 NOTE — Assessment & Plan Note (Signed)
Labs

## 2016-10-06 ENCOUNTER — Ambulatory Visit: Payer: Medicare Other | Admitting: Internal Medicine

## 2016-11-14 DIAGNOSIS — M50322 Other cervical disc degeneration at C5-C6 level: Secondary | ICD-10-CM | POA: Diagnosis not present

## 2016-11-14 DIAGNOSIS — M5136 Other intervertebral disc degeneration, lumbar region: Secondary | ICD-10-CM | POA: Diagnosis not present

## 2016-11-28 ENCOUNTER — Other Ambulatory Visit: Payer: Self-pay

## 2016-11-28 MED ORDER — CARVEDILOL 12.5 MG PO TABS: 12.5000 mg | ORAL_TABLET | Freq: Two times a day (BID) | ORAL | 1 refills | Status: DC

## 2016-12-08 ENCOUNTER — Telehealth: Payer: Self-pay | Admitting: Internal Medicine

## 2016-12-08 NOTE — Telephone Encounter (Signed)
Spoke with patient regarding awv. Pt stated that she will make wellness appt when she comes to the office on 01/02/17.

## 2017-01-02 ENCOUNTER — Ambulatory Visit (INDEPENDENT_AMBULATORY_CARE_PROVIDER_SITE_OTHER): Payer: Medicare Other | Admitting: Internal Medicine

## 2017-01-02 ENCOUNTER — Other Ambulatory Visit (INDEPENDENT_AMBULATORY_CARE_PROVIDER_SITE_OTHER): Payer: Medicare Other

## 2017-01-02 ENCOUNTER — Encounter: Payer: Self-pay | Admitting: Internal Medicine

## 2017-01-02 VITALS — BP 120/72 | HR 80 | Temp 98.2°F | Ht 63.0 in | Wt 170.0 lb

## 2017-01-02 DIAGNOSIS — R1310 Dysphagia, unspecified: Secondary | ICD-10-CM | POA: Diagnosis not present

## 2017-01-02 DIAGNOSIS — F419 Anxiety disorder, unspecified: Secondary | ICD-10-CM

## 2017-01-02 DIAGNOSIS — G8929 Other chronic pain: Secondary | ICD-10-CM | POA: Diagnosis not present

## 2017-01-02 DIAGNOSIS — N189 Chronic kidney disease, unspecified: Secondary | ICD-10-CM

## 2017-01-02 DIAGNOSIS — E559 Vitamin D deficiency, unspecified: Secondary | ICD-10-CM

## 2017-01-02 DIAGNOSIS — M5440 Lumbago with sciatica, unspecified side: Secondary | ICD-10-CM

## 2017-01-02 DIAGNOSIS — I10 Essential (primary) hypertension: Secondary | ICD-10-CM

## 2017-01-02 DIAGNOSIS — Z23 Encounter for immunization: Secondary | ICD-10-CM

## 2017-01-02 DIAGNOSIS — M109 Gout, unspecified: Secondary | ICD-10-CM | POA: Diagnosis not present

## 2017-01-02 LAB — BASIC METABOLIC PANEL
BUN: 31 mg/dL — AB (ref 6–23)
CHLORIDE: 102 meq/L (ref 96–112)
CO2: 30 meq/L (ref 19–32)
CREATININE: 1.01 mg/dL (ref 0.40–1.20)
Calcium: 10.3 mg/dL (ref 8.4–10.5)
GFR: 57.86 mL/min — ABNORMAL LOW (ref >60.00)
GLUCOSE: 94 mg/dL (ref 70–99)
Potassium: 4 mEq/L (ref 3.5–5.1)
Sodium: 137 mEq/L (ref 135–145)

## 2017-01-02 LAB — CBC WITH DIFFERENTIAL/PLATELET
BASOS PCT: 0.8 % (ref 0.0–3.0)
Basophils Absolute: 0.1 10*3/uL (ref 0.0–0.1)
EOS PCT: 2.5 % (ref 0.0–5.0)
Eosinophils Absolute: 0.2 10*3/uL (ref 0.0–0.7)
HCT: 47.1 % — ABNORMAL HIGH (ref 36.0–46.0)
Hemoglobin: 16 g/dL — ABNORMAL HIGH (ref 12.0–15.0)
LYMPHS ABS: 1.7 10*3/uL (ref 0.7–4.0)
Lymphocytes Relative: 24.8 % (ref 12.0–46.0)
MCHC: 33.9 g/dL (ref 30.0–36.0)
MCV: 90.1 fl (ref 78.0–100.0)
MONO ABS: 0.5 10*3/uL (ref 0.1–1.0)
Monocytes Relative: 7.8 % (ref 3.0–12.0)
NEUTROS ABS: 4.5 10*3/uL (ref 1.4–7.7)
NEUTROS PCT: 64.1 % (ref 43.0–77.0)
Platelets: 266 10*3/uL (ref 150.0–400.0)
RBC: 5.22 Mil/uL — ABNORMAL HIGH (ref 3.87–5.11)
RDW: 13.1 % (ref 11.5–15.5)
WBC: 7 10*3/uL (ref 4.0–10.5)

## 2017-01-02 LAB — URIC ACID: URIC ACID, SERUM: 8.2 mg/dL — AB (ref 2.4–7.0)

## 2017-01-02 MED ORDER — CARVEDILOL 12.5 MG PO TABS: 12.5000 mg | ORAL_TABLET | Freq: Two times a day (BID) | ORAL | 3 refills | Status: DC

## 2017-01-02 MED ORDER — ALLOPURINOL 100 MG PO TABS: 100.0000 mg | ORAL_TABLET | Freq: Every day | ORAL | 3 refills | Status: DC

## 2017-01-02 MED ORDER — SPIRONOLACTONE 50 MG PO TABS: 50.0000 mg | ORAL_TABLET | Freq: Every day | ORAL | 3 refills | Status: DC

## 2017-01-02 NOTE — Assessment & Plan Note (Signed)
Allopurinol  

## 2017-01-02 NOTE — Assessment & Plan Note (Addendum)
Norco prn Dr Nelva Bush, Dr Rolena Infante

## 2017-01-02 NOTE — Assessment & Plan Note (Signed)
Re-start Pepcid GI ref

## 2017-01-02 NOTE — Assessment & Plan Note (Signed)
Lbs

## 2017-01-02 NOTE — Assessment & Plan Note (Signed)
Coreg, Spironolactone 

## 2017-01-02 NOTE — Assessment & Plan Note (Signed)
Vit D 

## 2017-01-02 NOTE — Progress Notes (Signed)
Subjective:  Patient ID: Dawn Howard, female    DOB: 1951/04/18  Age: 66 y.o. MRN: [id number]  CC: No chief complaint on file.   HPI Dawn Howard presents for HTN, GERD, anxiety f/u C/o food gets stuck in the esophagus x months  Outpatient Medications Prior to Visit  Medication Sig Dispense Refill  . allopurinol (ZYLOPRIM) 100 MG tablet Take 1 tablet (100 mg total) by mouth daily. (Patient taking differently: Take 100 mg by mouth daily. 1/2 tablet) 30 tablet 6  . carvedilol (COREG) 12.5 MG tablet Take 1 tablet (12.5 mg total) by mouth 2 (two) times daily with a meal. 180 tablet 1  . colchicine 0.6 MG tablet Take 1 tablet (0.6 mg total) by mouth 2 (two) times daily. 60 tablet 1  . ergocalciferol (VITAMIN D2) 50000 units capsule Take 1 capsule (50,000 Units total) by mouth once a week. 6 capsule 0  . famotidine (PEPCID) 20 MG tablet Take 1 tablet (20 mg total) by mouth 2 (two) times daily. 60 tablet 5  . HYDROcodone-ibuprofen (VICOPROFEN) 7.5-200 MG tablet Take 1 tablet by mouth 2 (two) times daily.    . Multiple Vitamin (MULITIVITAMIN WITH MINERALS) TABS Take 1 tablet by mouth daily.    . predniSONE (DELTASONE) 10 MG tablet Take 40 mg on day 1; then 20 mg on day 2; and then 10 mg on days 3-4 prn gout. Take pc 40 tablet 1  . spironolactone (ALDACTONE) 50 MG tablet Take 1 tablet (50 mg total) by mouth daily. 30 tablet 5  . triamcinolone ointment (KENALOG) 0.5 % Apply 1 application topically 2 (two) times daily. 30 g 0  . ALPRAZolam (XANAX) 0.25 MG tablet Take 1 tablet (0.25 mg total) by mouth 2 (two) times daily. (Patient not taking: Reported on 01/02/2017) 60 tablet 3   No facility-administered medications prior to visit.     ROS Review of Systems  Constitutional: Negative for activity change, appetite change, chills, fatigue and unexpected weight change.  HENT: Negative for congestion, mouth sores and sinus pressure.   Eyes: Negative for visual disturbance.  Respiratory:  Negative for cough and chest tightness.   Gastrointestinal: Negative for abdominal pain and nausea.  Genitourinary: Negative for difficulty urinating, frequency and vaginal pain.  Musculoskeletal: Positive for arthralgias and back pain. Negative for gait problem.  Skin: Negative for pallor and rash.  Neurological: Negative for dizziness, tremors, weakness, numbness and headaches.  Psychiatric/Behavioral: Negative for confusion and sleep disturbance. The patient is nervous/anxious.     Objective:  BP 120/72 (BP Location: Left Arm, Patient Position: Sitting, Cuff Size: Large)   Pulse 80   Temp 98.2 F (36.8 C) (Oral)   Ht 5\' 3"  (1.6 m)   Wt 170 lb (77.1 kg)   SpO2 98%   BMI 30.11 kg/m   BP Readings from Last 3 Encounters:  01/02/17 120/72  10/03/16 122/84  09/26/16 118/84    Wt Readings from Last 3 Encounters:  01/02/17 170 lb (77.1 kg)  10/03/16 173 lb (78.5 kg)  09/26/16 170 lb (77.1 kg)    Physical Exam  Constitutional: She appears well-developed. No distress.  HENT:  Head: Normocephalic.  Right Ear: External ear normal.  Left Ear: External ear normal.  Nose: Nose normal.  Mouth/Throat: Oropharynx is clear and moist.  Eyes: Pupils are equal, round, and reactive to light. Conjunctivae are normal. Right eye exhibits no discharge. Left eye exhibits no discharge.  Neck: Normal range of motion. Neck supple. No JVD present. No tracheal deviation present.  No thyromegaly present.  Cardiovascular: Normal rate, regular rhythm and normal heart sounds.   Pulmonary/Chest: No stridor. No respiratory distress. She has no wheezes.  Abdominal: Soft. Bowel sounds are normal. She exhibits no distension and no mass. There is no tenderness. There is no rebound and no guarding.  Musculoskeletal: She exhibits no edema or tenderness.  Lymphadenopathy:    She has no cervical adenopathy.  Neurological: She displays normal reflexes. No cranial nerve deficit. She exhibits normal muscle tone.  Coordination normal.  Skin: No rash noted. No erythema.  Psychiatric: She has a normal mood and affect. Her behavior is normal. Judgment and thought content normal.    Lab Results  Component Value Date   WBC 7.2 06/21/2016   HGB 15.2 (H) 06/21/2016   HCT 44.1 06/21/2016   PLT 273.0 06/21/2016   GLUCOSE 89 06/21/2016   CHOL 226 (H) 06/08/2015   TRIG 152.0 (H) 06/08/2015   HDL 53.80 06/08/2015   LDLDIRECT 145.3 01/27/2013   LDLCALC 142 (H) 06/08/2015   ALT 26 12/30/2015   AST 17 12/30/2015   NA 137 06/21/2016   K 4.2 06/21/2016   CL 105 06/21/2016   CREATININE 1.08 06/21/2016   BUN 29 (H) 06/21/2016   CO2 26 06/21/2016   TSH 1.90 06/21/2016    Mm Digital Screening Bilateral  Result Date: 10/14/2015 CLINICAL DATA:  Screening. EXAM: DIGITAL SCREENING BILATERAL MAMMOGRAM WITH CAD COMPARISON:  None. ACR Breast Density Category b: There are scattered areas of fibroglandular density. FINDINGS: There are no findings suspicious for malignancy. Images were processed with CAD. IMPRESSION: No mammographic evidence of malignancy. A result letter of this screening mammogram will be mailed directly to the patient. RECOMMENDATION: Screening mammogram in one year. (Code:SM-B-01Y) BI-RADS CATEGORY  1: Negative. Electronically Signed   By: Abelardo Diesel M.D.   On: 10/15/2015 12:16    Assessment & Plan:   There are no diagnoses linked to this encounter. I have discontinued Dawn Howard's ALPRAZolam. I am also having her maintain her multivitamin with minerals, ergocalciferol, famotidine, predniSONE, allopurinol, colchicine, HYDROcodone-ibuprofen, spironolactone, triamcinolone ointment, and carvedilol.  No orders of the defined types were placed in this encounter.    Follow-up: No Follow-up on file.  Walker Kehr, MD

## 2017-01-02 NOTE — Assessment & Plan Note (Signed)
Xanax - rare

## 2017-01-03 ENCOUNTER — Other Ambulatory Visit: Payer: Self-pay | Admitting: Internal Medicine

## 2017-01-03 DIAGNOSIS — Z23 Encounter for immunization: Secondary | ICD-10-CM | POA: Diagnosis not present

## 2017-01-03 DIAGNOSIS — D751 Secondary polycythemia: Secondary | ICD-10-CM

## 2017-01-03 NOTE — Addendum Note (Signed)
Addended by: Karren Cobble on: 01/03/2017 04:04 PM   Modules accepted: Orders

## 2017-01-16 ENCOUNTER — Encounter: Payer: Self-pay | Admitting: Hematology and Oncology

## 2017-01-16 ENCOUNTER — Telehealth: Payer: Self-pay | Admitting: Hematology and Oncology

## 2017-01-16 NOTE — Telephone Encounter (Signed)
Appt has been scheduled for the pt to see Dr. Lebron Conners on 9/25 at 11am. Cecille Rubin from Dr. Judeen Hammans office will notify the pt. Letter mailed.

## 2017-01-30 ENCOUNTER — Ambulatory Visit (HOSPITAL_BASED_OUTPATIENT_CLINIC_OR_DEPARTMENT_OTHER): Payer: Medicare Other

## 2017-01-30 ENCOUNTER — Telehealth: Payer: Self-pay | Admitting: Hematology and Oncology

## 2017-01-30 ENCOUNTER — Ambulatory Visit (HOSPITAL_BASED_OUTPATIENT_CLINIC_OR_DEPARTMENT_OTHER): Payer: Medicare Other | Admitting: Hematology and Oncology

## 2017-01-30 ENCOUNTER — Encounter: Payer: Self-pay | Admitting: Hematology and Oncology

## 2017-01-30 VITALS — BP 143/81 | HR 73 | Temp 98.5°F | Resp 20 | Ht 63.0 in | Wt 172.7 lb

## 2017-01-30 DIAGNOSIS — D582 Other hemoglobinopathies: Secondary | ICD-10-CM

## 2017-01-30 LAB — COMPREHENSIVE METABOLIC PANEL
ALBUMIN: 3.8 g/dL (ref 3.5–5.0)
ALT: 30 U/L (ref 0–55)
ANION GAP: 6 meq/L (ref 3–11)
AST: 20 U/L (ref 5–34)
Alkaline Phosphatase: 111 U/L (ref 40–150)
BILIRUBIN TOTAL: 0.6 mg/dL (ref 0.20–1.20)
BUN: 23.1 mg/dL (ref 7.0–26.0)
CALCIUM: 10.2 mg/dL (ref 8.4–10.4)
CO2: 26 meq/L (ref 22–29)
CREATININE: 1 mg/dL (ref 0.6–1.1)
Chloride: 107 mEq/L (ref 98–109)
EGFR: 61 mL/min/{1.73_m2} — ABNORMAL LOW (ref >90)
Glucose: 87 mg/dl (ref 70–140)
Potassium: 4 mEq/L (ref 3.5–5.1)
Sodium: 139 mEq/L (ref 136–145)
TOTAL PROTEIN: 7.3 g/dL (ref 6.4–8.3)

## 2017-01-30 LAB — CBC & DIFF AND RETIC
BASO%: 0.4 % (ref 0.0–2.0)
Basophils Absolute: 0 10*3/uL (ref 0.0–0.1)
EOS%: 3.3 % (ref 0.0–7.0)
Eosinophils Absolute: 0.2 10*3/uL (ref 0.0–0.5)
HCT: 44.3 % (ref 34.8–46.6)
HGB: 15.2 g/dL (ref 11.6–15.9)
Immature Retic Fract: 3.8 % (ref 1.60–10.00)
LYMPH#: 1.6 10*3/uL (ref 0.9–3.3)
LYMPH%: 22.6 % (ref 14.0–49.7)
MCH: 30.6 pg (ref 25.1–34.0)
MCHC: 34.3 g/dL (ref 31.5–36.0)
MCV: 89.3 fL (ref 79.5–101.0)
MONO#: 0.6 10*3/uL (ref 0.1–0.9)
MONO%: 9.3 % (ref 0.0–14.0)
NEUT%: 64.4 % (ref 38.4–76.8)
NEUTROS ABS: 4.5 10*3/uL (ref 1.5–6.5)
PLATELETS: 225 10*3/uL (ref 145–400)
RBC: 4.96 10*6/uL (ref 3.70–5.45)
RDW: 13.2 % (ref 11.2–14.5)
Retic %: 1.45 % (ref 0.70–2.10)
Retic Ct Abs: 71.92 10*3/uL (ref 33.70–90.70)
WBC: 6.9 10*3/uL (ref 3.9–10.3)

## 2017-01-30 LAB — MORPHOLOGY: PLT EST: ADEQUATE

## 2017-01-30 LAB — LACTATE DEHYDROGENASE: LDH: 154 U/L (ref 125–245)

## 2017-01-30 NOTE — Telephone Encounter (Signed)
Gave avs and calendar for October  °

## 2017-01-31 LAB — ERYTHROPOIETIN: ERYTHROPOIETIN: 5.9 m[IU]/mL (ref 2.6–18.5)
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
## 2017-02-04 ENCOUNTER — Encounter: Payer: Self-pay | Admitting: Hematology and Oncology

## 2017-02-04 DIAGNOSIS — D582 Other hemoglobinopathies: Secondary | ICD-10-CM | POA: Insufficient documentation

## 2017-02-04 NOTE — Assessment & Plan Note (Signed)
66 y.o. female referred to our clinic for erythrocytosis with possible macrocytosis and suspicion for possible polycythemia. Patient does have a strong history of second-hand tobacco exposure making underlying COPD the most likely etiology for erythrocytosis, but a primary polycythemia due to MPN or MDS cannot be excluded.   We will obtain initial labs and review them with the patient at the next clinic visit  Plan: --Labs as outlined below. --RTC 1 week to review

## 2017-02-06 ENCOUNTER — Ambulatory Visit (HOSPITAL_BASED_OUTPATIENT_CLINIC_OR_DEPARTMENT_OTHER): Payer: Medicare Other | Admitting: Hematology and Oncology

## 2017-02-06 ENCOUNTER — Telehealth: Payer: Self-pay | Admitting: Hematology and Oncology

## 2017-02-06 ENCOUNTER — Encounter: Payer: Self-pay | Admitting: Hematology and Oncology

## 2017-02-06 VITALS — BP 122/83 | HR 77 | Temp 98.3°F | Resp 18 | Ht 63.0 in | Wt 170.4 lb

## 2017-02-06 DIAGNOSIS — D751 Secondary polycythemia: Secondary | ICD-10-CM | POA: Diagnosis not present

## 2017-02-06 DIAGNOSIS — D582 Other hemoglobinopathies: Secondary | ICD-10-CM

## 2017-02-06 NOTE — Telephone Encounter (Signed)
Per 10/2 - nop additional appts scheduled. F/u PRN only

## 2017-02-09 NOTE — Assessment & Plan Note (Signed)
66 y.o. female referred to our clinic for erythrocytosis with possible macrocytosis and suspicion for possible polycythemia. Patient does have a strong history of second-hand tobacco exposure making underlying COPD the most likely etiology for erythrocytosis, but a primary polycythemia due to MPN or MDS could be excluded.   Repeat labwork obtained in our clinic demonstrates normal hemoglobin level, and normal level of erythropoietin. Together, these speak against presence of any significant hematological abnormality.  Plan: --Return to PCP care and f/u with [place] PRN
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
## 2017-04-21 DIAGNOSIS — G894 Chronic pain syndrome: Secondary | ICD-10-CM | POA: Diagnosis not present

## 2017-04-21 DIAGNOSIS — M50322 Other cervical disc degeneration at C5-C6 level: Secondary | ICD-10-CM | POA: Diagnosis not present

## 2017-04-21 DIAGNOSIS — M5136 Other intervertebral disc degeneration, lumbar region: Secondary | ICD-10-CM | POA: Diagnosis not present

## 2017-04-21 DIAGNOSIS — Z79891 Long term (current) use of opiate analgesic: Secondary | ICD-10-CM | POA: Diagnosis not present

## 2017-05-03 ENCOUNTER — Encounter: Payer: Self-pay | Admitting: Internal Medicine

## 2017-05-04 ENCOUNTER — Ambulatory Visit (INDEPENDENT_AMBULATORY_CARE_PROVIDER_SITE_OTHER): Payer: Medicare Other | Admitting: Internal Medicine

## 2017-05-04 ENCOUNTER — Encounter: Payer: Self-pay | Admitting: Internal Medicine

## 2017-05-04 DIAGNOSIS — M109 Gout, unspecified: Secondary | ICD-10-CM

## 2017-05-04 DIAGNOSIS — G8929 Other chronic pain: Secondary | ICD-10-CM

## 2017-05-04 DIAGNOSIS — M5440 Lumbago with sciatica, unspecified side: Secondary | ICD-10-CM

## 2017-05-04 DIAGNOSIS — I10 Essential (primary) hypertension: Secondary | ICD-10-CM

## 2017-05-04 DIAGNOSIS — N189 Chronic kidney disease, unspecified: Secondary | ICD-10-CM

## 2017-05-04 DIAGNOSIS — F419 Anxiety disorder, unspecified: Secondary | ICD-10-CM | POA: Diagnosis not present

## 2017-05-04 DIAGNOSIS — D751 Secondary polycythemia: Secondary | ICD-10-CM | POA: Diagnosis not present

## 2017-05-04 MED ORDER — ALLOPURINOL 100 MG PO TABS: 100.0000 mg | ORAL_TABLET | Freq: Every day | ORAL | 3 refills | Status: DC

## 2017-05-04 NOTE — Assessment & Plan Note (Signed)
?  etiology S/p eval by Dr Lebron Conners 2018 - rec observation

## 2017-05-04 NOTE — Assessment & Plan Note (Signed)
F/u w/Dr Nelva Bush, Dr Rolena Infante

## 2017-05-04 NOTE — Assessment & Plan Note (Signed)
Xanax prn 

## 2017-05-04 NOTE — Progress Notes (Signed)
Subjective:  Patient ID: Dawn Howard, female    DOB: 09/17/1950  Age: 66 y.o. MRN: [id number]  CC: No chief complaint on file.   HPI Dawn Howard presents for polycythemia, gout, HTN f/u F/u LBP - Dr Nelva Bush, Dr Rolena Infante  Outpatient Medications Prior to Visit  Medication Sig Dispense Refill  . carvedilol (COREG) 12.5 MG tablet Take 1 tablet (12.5 mg total) by mouth 2 (two) times daily with a meal. 180 tablet 3  . colchicine 0.6 MG tablet Take 1 tablet (0.6 mg total) by mouth 2 (two) times daily. 60 tablet 1  . ergocalciferol (VITAMIN D2) 50000 units capsule Take 1 capsule (50,000 Units total) by mouth once a week. 6 capsule 0  . famotidine (PEPCID) 20 MG tablet Take 1 tablet (20 mg total) by mouth 2 (two) times daily. (Patient taking differently: Take 20 mg by mouth 2 (two) times daily as needed. ) 60 tablet 5  . HYDROcodone-ibuprofen (VICOPROFEN) 7.5-200 MG tablet Take 1 tablet by mouth 2 (two) times daily.    . Multiple Vitamin (MULITIVITAMIN WITH MINERALS) TABS Take 1 tablet by mouth daily.    . predniSONE (DELTASONE) 10 MG tablet Take 40 mg on day 1; then 20 mg on day 2; and then 10 mg on days 3-4 prn gout. Take pc (Patient taking differently: as needed. Take 40 mg on day 1; then 20 mg on day 2; and then 10 mg on days 3-4 prn gout. Take pc) 40 tablet 1  . spironolactone (ALDACTONE) 50 MG tablet Take 1 tablet (50 mg total) by mouth daily. 90 tablet 3  . triamcinolone ointment (KENALOG) 0.5 % Apply 1 application topically 2 (two) times daily. 30 g 0  . allopurinol (ZYLOPRIM) 100 MG tablet Take 1 tablet (100 mg total) by mouth daily. 90 tablet 3   No facility-administered medications prior to visit.     ROS Review of Systems  Constitutional: Negative for activity change, appetite change, chills, fatigue and unexpected weight change.  HENT: Negative for congestion, mouth sores and sinus pressure.   Eyes: Negative for visual disturbance.  Respiratory: Negative for cough and chest  tightness.   Gastrointestinal: Negative for abdominal pain and nausea.  Genitourinary: Negative for difficulty urinating, frequency and vaginal pain.  Musculoskeletal: Positive for back pain. Negative for gait problem.  Skin: Negative for pallor and rash.  Neurological: Negative for dizziness, tremors, weakness, numbness and headaches.  Psychiatric/Behavioral: Negative for confusion and sleep disturbance.    Objective:  BP 118/76 (BP Location: Left Arm, Patient Position: Sitting, Cuff Size: Large)   Pulse 84   Temp 97.7 F (36.5 C) (Oral)   Ht 5\' 3"  (1.6 m)   Wt 174 lb (78.9 kg)   SpO2 98%   BMI 30.82 kg/m   BP Readings from Last 3 Encounters:  05/04/17 118/76  02/06/17 122/83  01/30/17 (!) 143/81    Wt Readings from Last 3 Encounters:  05/04/17 174 lb (78.9 kg)  02/06/17 170 lb 6.4 oz (77.3 kg)  01/30/17 172 lb 11.2 oz (78.3 kg)    Physical Exam  Constitutional: She appears well-developed. No distress.  HENT:  Head: Normocephalic.  Right Ear: External ear normal.  Left Ear: External ear normal.  Nose: Nose normal.  Mouth/Throat: Oropharynx is clear and moist.  Eyes: Conjunctivae are normal. Pupils are equal, round, and reactive to light. Right eye exhibits no discharge. Left eye exhibits no discharge.  Neck: Normal range of motion. Neck supple. No JVD present. No tracheal deviation present.  No thyromegaly present.  Cardiovascular: Normal rate, regular rhythm and normal heart sounds.  Pulmonary/Chest: No stridor. No respiratory distress. She has no wheezes.  Abdominal: Soft. Bowel sounds are normal. She exhibits no distension and no mass. There is no tenderness. There is no rebound and no guarding.  Musculoskeletal: She exhibits no edema or tenderness.  Lymphadenopathy:    She has no cervical adenopathy.  Neurological: She displays normal reflexes. No cranial nerve deficit. She exhibits normal muscle tone. Coordination normal.  Skin: No rash noted. No erythema.    Psychiatric: She has a normal mood and affect. Her behavior is normal. Judgment and thought content normal.   LS tender Lab Results  Component Value Date   WBC 6.9 01/30/2017   HGB 15.2 01/30/2017   HCT 44.3 01/30/2017   PLT 225 01/30/2017   GLUCOSE 87 01/30/2017   CHOL 226 (H) 06/08/2015   TRIG 152.0 (H) 06/08/2015   HDL 53.80 06/08/2015   LDLDIRECT 145.3 01/27/2013   LDLCALC 142 (H) 06/08/2015   ALT 30 01/30/2017   AST 20 01/30/2017   NA 139 01/30/2017   K 4.0 01/30/2017   CL 102 01/02/2017   CREATININE 1.0 01/30/2017   BUN 23.1 01/30/2017   CO2 26 01/30/2017   TSH 1.90 06/21/2016    Mm Digital Screening Bilateral  Result Date: 10/14/2015 CLINICAL DATA:  Screening. EXAM: DIGITAL SCREENING BILATERAL MAMMOGRAM WITH CAD COMPARISON:  None. ACR Breast Density Category b: There are scattered areas of fibroglandular density. FINDINGS: There are no findings suspicious for malignancy. Images were processed with CAD. IMPRESSION: No mammographic evidence of malignancy. A result letter of this screening mammogram will be mailed directly to the patient. RECOMMENDATION: Screening mammogram in one year. (Code:SM-B-01Y) BI-RADS CATEGORY  1: Negative. Electronically Signed   By: Abelardo Diesel M.D.   On: 10/15/2015 12:16    Assessment & Plan:   There are no diagnoses linked to this encounter. I am having Dawn Howard maintain her multivitamin with minerals, ergocalciferol, famotidine, predniSONE, colchicine, HYDROcodone-ibuprofen, triamcinolone ointment, carvedilol, spironolactone, and allopurinol.  Meds ordered this encounter  Medications  . allopurinol (ZYLOPRIM) 100 MG tablet    Sig: Take 1 tablet (100 mg total) by mouth daily.    Dispense:  90 tablet    Refill:  3     Follow-up: No Follow-up on file.  Walker Kehr, MD

## 2017-05-04 NOTE — Assessment & Plan Note (Signed)
Coreg, Spironolactone 

## 2017-05-04 NOTE — Assessment & Plan Note (Signed)
Labs

## 2017-05-04 NOTE — Assessment & Plan Note (Signed)
Allopurinol  

## 2017-08-29 DIAGNOSIS — G8929 Other chronic pain: Secondary | ICD-10-CM | POA: Insufficient documentation

## 2017-08-29 DIAGNOSIS — M542 Cervicalgia: Secondary | ICD-10-CM | POA: Insufficient documentation

## 2017-08-29 DIAGNOSIS — Z79891 Long term (current) use of opiate analgesic: Secondary | ICD-10-CM | POA: Insufficient documentation

## 2017-08-29 DIAGNOSIS — M545 Low back pain: Secondary | ICD-10-CM | POA: Diagnosis not present

## 2017-08-29 DIAGNOSIS — M5136 Other intervertebral disc degeneration, lumbar region: Secondary | ICD-10-CM | POA: Diagnosis not present

## 2017-08-29 DIAGNOSIS — M5030 Other cervical disc degeneration, unspecified cervical region: Secondary | ICD-10-CM | POA: Insufficient documentation

## 2017-09-03 ENCOUNTER — Encounter: Payer: Self-pay | Admitting: Internal Medicine

## 2017-09-03 ENCOUNTER — Ambulatory Visit (INDEPENDENT_AMBULATORY_CARE_PROVIDER_SITE_OTHER): Payer: Medicare Other | Admitting: Internal Medicine

## 2017-09-03 DIAGNOSIS — G8929 Other chronic pain: Secondary | ICD-10-CM | POA: Diagnosis not present

## 2017-09-03 DIAGNOSIS — F419 Anxiety disorder, unspecified: Secondary | ICD-10-CM | POA: Diagnosis not present

## 2017-09-03 DIAGNOSIS — I10 Essential (primary) hypertension: Secondary | ICD-10-CM | POA: Diagnosis not present

## 2017-09-03 DIAGNOSIS — M109 Gout, unspecified: Secondary | ICD-10-CM

## 2017-09-03 DIAGNOSIS — N189 Chronic kidney disease, unspecified: Secondary | ICD-10-CM | POA: Diagnosis not present

## 2017-09-03 DIAGNOSIS — M5440 Lumbago with sciatica, unspecified side: Secondary | ICD-10-CM | POA: Diagnosis not present

## 2017-09-03 DIAGNOSIS — K219 Gastro-esophageal reflux disease without esophagitis: Secondary | ICD-10-CM | POA: Diagnosis not present

## 2017-09-03 MED ORDER — HYDROCODONE-ACETAMINOPHEN 7.5-325 MG PO TABS: ORAL_TABLET | ORAL | 0 refills | Status: DC

## 2017-09-03 NOTE — Assessment & Plan Note (Signed)
Norco prn  Potential benefits of a long term opioids use as well as potential risks (i.e. addiction risk, apnea etc) and complications (i.e. Somnolence, constipation and others) were explained to the patient and were aknowledged. 

## 2017-09-03 NOTE — Assessment & Plan Note (Signed)
Labs

## 2017-09-03 NOTE — Assessment & Plan Note (Signed)
On Coreg, Spironolactone 

## 2017-09-03 NOTE — Progress Notes (Signed)
Subjective:  Patient ID: Dawn Howard, female    DOB: 1950/05/18  Age: 67 y.o. MRN: [id number]  CC: No chief complaint on file.   HPI Dawn Howard presents for gout, HTN, GERD, OA/LBP - seeing Dr Nelva Bush f/u   Outpatient Medications Prior to Visit  Medication Sig Dispense Refill  . allopurinol (ZYLOPRIM) 100 MG tablet Take 1 tablet (100 mg total) by mouth daily. 90 tablet 3  . carvedilol (COREG) 12.5 MG tablet Take 1 tablet (12.5 mg total) by mouth 2 (two) times daily with a meal. 180 tablet 3  . colchicine 0.6 MG tablet Take 1 tablet (0.6 mg total) by mouth 2 (two) times daily. 60 tablet 1  . ergocalciferol (VITAMIN D2) 50000 units capsule Take 1 capsule (50,000 Units total) by mouth once a week. 6 capsule 0  . famotidine (PEPCID) 20 MG tablet Take 1 tablet (20 mg total) by mouth 2 (two) times daily. (Patient taking differently: Take 20 mg by mouth 2 (two) times daily as needed. ) 60 tablet 5  . HYDROcodone-acetaminophen (NORCO) 7.5-325 MG tablet TK 1 T PO BID PRN  0  . Multiple Vitamin (MULITIVITAMIN WITH MINERALS) TABS Take 1 tablet by mouth daily.    . predniSONE (DELTASONE) 10 MG tablet Take 40 mg on day 1; then 20 mg on day 2; and then 10 mg on days 3-4 prn gout. Take pc (Patient taking differently: as needed. Take 40 mg on day 1; then 20 mg on day 2; and then 10 mg on days 3-4 prn gout. Take pc) 40 tablet 1  . spironolactone (ALDACTONE) 50 MG tablet Take 1 tablet (50 mg total) by mouth daily. 90 tablet 3  . triamcinolone ointment (KENALOG) 0.5 % Apply 1 application topically 2 (two) times daily. 30 g 0  . HYDROcodone-ibuprofen (VICOPROFEN) 7.5-200 MG tablet Take 1 tablet by mouth 2 (two) times daily.     No facility-administered medications prior to visit.     ROS Review of Systems  Constitutional: Negative for activity change, appetite change, chills, fatigue and unexpected weight change.  HENT: Negative for congestion, mouth sores and sinus pressure.   Eyes: Negative  for visual disturbance.  Respiratory: Negative for cough and chest tightness.   Gastrointestinal: Negative for abdominal pain and nausea.  Genitourinary: Negative for difficulty urinating, frequency and vaginal pain.  Musculoskeletal: Positive for arthralgias and back pain. Negative for gait problem.  Skin: Negative for pallor and rash.  Neurological: Positive for headaches. Negative for dizziness, tremors, weakness and numbness.  Psychiatric/Behavioral: Negative for confusion and sleep disturbance. The patient is nervous/anxious.     Objective:  BP 124/82 (BP Location: Left Arm, Patient Position: Sitting, Cuff Size: Normal)   Pulse 86   Temp 98.4 F (36.9 C) (Oral)   Ht 5\' 3"  (1.6 m)   Wt 175 lb (79.4 kg)   SpO2 97%   BMI 31.00 kg/m   BP Readings from Last 3 Encounters:  09/03/17 124/82  05/04/17 118/76  02/06/17 122/83    Wt Readings from Last 3 Encounters:  09/03/17 175 lb (79.4 kg)  05/04/17 174 lb (78.9 kg)  02/06/17 170 lb 6.4 oz (77.3 kg)    Physical Exam  Constitutional: She appears well-developed. No distress.  HENT:  Head: Normocephalic.  Right Ear: External ear normal.  Left Ear: External ear normal.  Nose: Nose normal.  Mouth/Throat: Oropharynx is clear and moist.  Eyes: Pupils are equal, round, and reactive to light. Conjunctivae are normal. Right eye exhibits no discharge.  Left eye exhibits no discharge.  Neck: Normal range of motion. Neck supple. No JVD present. No tracheal deviation present. No thyromegaly present.  Cardiovascular: Normal rate, regular rhythm and normal heart sounds.  Pulmonary/Chest: No stridor. No respiratory distress. She has no wheezes.  Abdominal: Soft. Bowel sounds are normal. She exhibits no distension and no mass. There is no tenderness. There is no rebound and no guarding.  Musculoskeletal: She exhibits tenderness. She exhibits no edema.  Lymphadenopathy:    She has no cervical adenopathy.  Neurological: She displays normal  reflexes. No cranial nerve deficit. She exhibits normal muscle tone. Coordination normal.  Skin: No rash noted. No erythema.  Psychiatric: She has a normal mood and affect. Her behavior is normal. Judgment and thought content normal.   LS - tender w/ROM  Lab Results  Component Value Date   WBC 6.9 01/30/2017   HGB 15.2 01/30/2017   HCT 44.3 01/30/2017   PLT 225 01/30/2017   GLUCOSE 87 01/30/2017   CHOL 226 (H) 06/08/2015   TRIG 152.0 (H) 06/08/2015   HDL 53.80 06/08/2015   LDLDIRECT 145.3 01/27/2013   LDLCALC 142 (H) 06/08/2015   ALT 30 01/30/2017   AST 20 01/30/2017   NA 139 01/30/2017   K 4.0 01/30/2017   CL 102 01/02/2017   CREATININE 1.0 01/30/2017   BUN 23.1 01/30/2017   CO2 26 01/30/2017   TSH 1.90 06/21/2016    Mm Digital Screening Bilateral  Result Date: 10/14/2015 CLINICAL DATA:  Screening. EXAM: DIGITAL SCREENING BILATERAL MAMMOGRAM WITH CAD COMPARISON:  None. ACR Breast Density Category b: There are scattered areas of fibroglandular density. FINDINGS: There are no findings suspicious for malignancy. Images were processed with CAD. IMPRESSION: No mammographic evidence of malignancy. A result letter of this screening mammogram will be mailed directly to the patient. RECOMMENDATION: Screening mammogram in one year. (Code:SM-B-01Y) BI-RADS CATEGORY  1: Negative. Electronically Signed   By: Abelardo Diesel M.D.   On: 10/15/2015 12:16    Assessment & Plan:   There are no diagnoses linked to this encounter. I have discontinued Dawn Howard's HYDROcodone-ibuprofen. I am also having her maintain her multivitamin with minerals, ergocalciferol, famotidine, predniSONE, colchicine, triamcinolone ointment, carvedilol, spironolactone, allopurinol, and HYDROcodone-acetaminophen.  No orders of the defined types were placed in this encounter.    Follow-up: No follow-ups on file.  Walker Kehr, MD

## 2017-09-03 NOTE — Assessment & Plan Note (Signed)
Pepcid?

## 2017-09-03 NOTE — Assessment & Plan Note (Signed)
Xanax prn  Potential benefits of a long term benzodiazepines  use as well as potential risks  and complications were explained to the patient and were aknowledged. 

## 2017-09-03 NOTE — Assessment & Plan Note (Signed)
Allopurinol  

## 2017-09-10 DIAGNOSIS — H17821 Peripheral opacity of cornea, right eye: Secondary | ICD-10-CM | POA: Diagnosis not present

## 2017-09-10 DIAGNOSIS — H43393 Other vitreous opacities, bilateral: Secondary | ICD-10-CM | POA: Diagnosis not present

## 2017-09-10 DIAGNOSIS — H04123 Dry eye syndrome of bilateral lacrimal glands: Secondary | ICD-10-CM | POA: Diagnosis not present

## 2017-09-10 DIAGNOSIS — Z961 Presence of intraocular lens: Secondary | ICD-10-CM | POA: Diagnosis not present

## 2017-11-21 ENCOUNTER — Other Ambulatory Visit: Payer: Self-pay

## 2017-11-21 MED ORDER — SPIRONOLACTONE 50 MG PO TABS: 50.0000 mg | ORAL_TABLET | Freq: Every day | ORAL | 3 refills | Status: DC

## 2017-11-21 MED ORDER — CARVEDILOL 12.5 MG PO TABS: 12.5000 mg | ORAL_TABLET | Freq: Two times a day (BID) | ORAL | 3 refills | Status: DC

## 2017-11-28 DIAGNOSIS — H01025 Squamous blepharitis left lower eyelid: Secondary | ICD-10-CM | POA: Diagnosis not present

## 2017-11-28 DIAGNOSIS — H17821 Peripheral opacity of cornea, right eye: Secondary | ICD-10-CM | POA: Diagnosis not present

## 2017-11-28 DIAGNOSIS — H01021 Squamous blepharitis right upper eyelid: Secondary | ICD-10-CM | POA: Diagnosis not present

## 2017-11-28 DIAGNOSIS — H04123 Dry eye syndrome of bilateral lacrimal glands: Secondary | ICD-10-CM | POA: Diagnosis not present

## 2017-11-28 DIAGNOSIS — H01024 Squamous blepharitis left upper eyelid: Secondary | ICD-10-CM | POA: Diagnosis not present

## 2017-11-28 DIAGNOSIS — H01022 Squamous blepharitis right lower eyelid: Secondary | ICD-10-CM | POA: Diagnosis not present

## 2017-11-28 DIAGNOSIS — H43393 Other vitreous opacities, bilateral: Secondary | ICD-10-CM | POA: Diagnosis not present

## 2017-11-28 DIAGNOSIS — Z961 Presence of intraocular lens: Secondary | ICD-10-CM | POA: Diagnosis not present

## 2018-01-02 ENCOUNTER — Encounter: Payer: Self-pay | Admitting: Internal Medicine

## 2018-01-02 ENCOUNTER — Ambulatory Visit (INDEPENDENT_AMBULATORY_CARE_PROVIDER_SITE_OTHER): Payer: Medicare Other | Admitting: Internal Medicine

## 2018-01-02 ENCOUNTER — Other Ambulatory Visit (INDEPENDENT_AMBULATORY_CARE_PROVIDER_SITE_OTHER): Payer: Medicare Other

## 2018-01-02 VITALS — BP 120/76 | HR 93 | Temp 98.4°F | Ht 63.0 in | Wt 176.0 lb

## 2018-01-02 DIAGNOSIS — E785 Hyperlipidemia, unspecified: Secondary | ICD-10-CM

## 2018-01-02 DIAGNOSIS — M109 Gout, unspecified: Secondary | ICD-10-CM | POA: Diagnosis not present

## 2018-01-02 DIAGNOSIS — F419 Anxiety disorder, unspecified: Secondary | ICD-10-CM

## 2018-01-02 DIAGNOSIS — R202 Paresthesia of skin: Secondary | ICD-10-CM

## 2018-01-02 DIAGNOSIS — R5383 Other fatigue: Secondary | ICD-10-CM

## 2018-01-02 DIAGNOSIS — E876 Hypokalemia: Secondary | ICD-10-CM

## 2018-01-02 LAB — URINALYSIS
BILIRUBIN URINE: NEGATIVE
HGB URINE DIPSTICK: NEGATIVE
Ketones, ur: NEGATIVE
Leukocytes, UA: NEGATIVE
NITRITE: NEGATIVE
Specific Gravity, Urine: 1.02 (ref 1.000–1.030)
TOTAL PROTEIN, URINE-UPE24: NEGATIVE
URINE GLUCOSE: NEGATIVE
UROBILINOGEN UA: 0.2 (ref 0.0–1.0)
pH: 5.5 (ref 5.0–8.0)

## 2018-01-02 LAB — HEPATIC FUNCTION PANEL
ALK PHOS: 94 U/L (ref 39–117)
ALT: 34 U/L (ref 0–35)
AST: 21 U/L (ref 0–37)
Albumin: 4.2 g/dL (ref 3.5–5.2)
BILIRUBIN DIRECT: 0.1 mg/dL (ref 0.0–0.3)
BILIRUBIN TOTAL: 0.6 mg/dL (ref 0.2–1.2)
TOTAL PROTEIN: 7.4 g/dL (ref 6.0–8.3)

## 2018-01-02 LAB — LIPID PANEL
Cholesterol: 228 mg/dL — ABNORMAL HIGH (ref 0–200)
HDL: 46.8 mg/dL (ref >39.00)
NONHDL: 180.88
TRIGLYCERIDES: 222 mg/dL — AB (ref 0.0–149.0)
Total CHOL/HDL Ratio: 5
VLDL: 44.4 mg/dL — ABNORMAL HIGH (ref 0.0–40.0)

## 2018-01-02 LAB — CBC WITH DIFFERENTIAL/PLATELET
Basophils Absolute: 0.1 10*3/uL (ref 0.0–0.1)
Basophils Relative: 1 % (ref 0.0–3.0)
EOS ABS: 0.2 10*3/uL (ref 0.0–0.7)
EOS PCT: 2.5 % (ref 0.0–5.0)
HCT: 46.1 % — ABNORMAL HIGH (ref 36.0–46.0)
Hemoglobin: 15.8 g/dL — ABNORMAL HIGH (ref 12.0–15.0)
LYMPHS ABS: 2.2 10*3/uL (ref 0.7–4.0)
Lymphocytes Relative: 27.4 % (ref 12.0–46.0)
MCHC: 34.3 g/dL (ref 30.0–36.0)
MCV: 89.2 fl (ref 78.0–100.0)
MONO ABS: 0.6 10*3/uL (ref 0.1–1.0)
Monocytes Relative: 7.5 % (ref 3.0–12.0)
NEUTROS PCT: 61.6 % (ref 43.0–77.0)
Neutro Abs: 5 10*3/uL (ref 1.4–7.7)
Platelets: 275 10*3/uL (ref 150.0–400.0)
RBC: 5.18 Mil/uL — AB (ref 3.87–5.11)
RDW: 13.4 % (ref 11.5–15.5)
WBC: 8 10*3/uL (ref 4.0–10.5)

## 2018-01-02 LAB — URIC ACID: Uric Acid, Serum: 5.9 mg/dL (ref 2.4–7.0)

## 2018-01-02 LAB — CORTISOL: Cortisol, Plasma: 14.7 ug/dL

## 2018-01-02 LAB — VITAMIN B12: Vitamin B-12: 669 pg/mL (ref 211–911)

## 2018-01-02 LAB — BASIC METABOLIC PANEL
BUN: 30 mg/dL — AB (ref 6–23)
CALCIUM: 10.4 mg/dL (ref 8.4–10.5)
CO2: 22 mEq/L (ref 19–32)
CREATININE: 1.41 mg/dL — AB (ref 0.40–1.20)
Chloride: 104 mEq/L (ref 96–112)
GFR: 39.25 mL/min — AB (ref >60.00)
GLUCOSE: 93 mg/dL (ref 70–99)
Potassium: 4.2 mEq/L (ref 3.5–5.1)
Sodium: 136 mEq/L (ref 135–145)

## 2018-01-02 LAB — TSH: TSH: 1.85 u[IU]/mL (ref 0.35–4.50)

## 2018-01-02 LAB — LDL CHOLESTEROL, DIRECT: LDL DIRECT: 160 mg/dL

## 2018-01-02 MED ORDER — IBUPROFEN 600 MG PO TABS: 600.0000 mg | ORAL_TABLET | Freq: Three times a day (TID) | ORAL | 1 refills | Status: DC | PRN

## 2018-01-02 MED ORDER — ALLOPURINOL 100 MG PO TABS: 100.0000 mg | ORAL_TABLET | Freq: Every day | ORAL | 3 refills | Status: DC

## 2018-01-02 MED ORDER — CARVEDILOL 12.5 MG PO TABS: 6.2500 mg | ORAL_TABLET | Freq: Two times a day (BID) | ORAL | 3 refills | Status: DC

## 2018-01-02 NOTE — Assessment & Plan Note (Signed)
Rare Xanax use

## 2018-01-02 NOTE — Progress Notes (Signed)
Subjective:  Patient ID: Dawn Howard, female    DOB: 12-09-1950  Age: 66 y.o. MRN: [id number]  CC: No chief complaint on file.   HPI Dawn Howard presents for fatigue around 3 pm every day x months   Outpatient Medications Prior to Visit  Medication Sig Dispense Refill  . carvedilol (COREG) 12.5 MG tablet Take 1 tablet (12.5 mg total) by mouth 2 (two) times daily with a meal. 180 tablet 3  . colchicine 0.6 MG tablet Take 1 tablet (0.6 mg total) by mouth 2 (two) times daily. 60 tablet 1  . ergocalciferol (VITAMIN D2) 50000 units capsule Take 1 capsule (50,000 Units total) by mouth once a week. 6 capsule 0  . famotidine (PEPCID) 20 MG tablet Take 1 tablet (20 mg total) by mouth 2 (two) times daily. (Patient taking differently: Take 20 mg by mouth 2 (two) times daily as needed. ) 60 tablet 5  . HYDROcodone-acetaminophen (NORCO) 7.5-325 MG tablet TK 1 T PO BID PRN 30 tablet 0  . Multiple Vitamin (MULITIVITAMIN WITH MINERALS) TABS Take 1 tablet by mouth daily.    . predniSONE (DELTASONE) 10 MG tablet Take 40 mg on day 1; then 20 mg on day 2; and then 10 mg on days 3-4 prn gout. Take pc (Patient taking differently: as needed. Take 40 mg on day 1; then 20 mg on day 2; and then 10 mg on days 3-4 prn gout. Take pc) 40 tablet 1  . spironolactone (ALDACTONE) 50 MG tablet Take 1 tablet (50 mg total) by mouth daily. 90 tablet 3  . triamcinolone ointment (KENALOG) 0.5 % Apply 1 application topically 2 (two) times daily. 30 g 0  . allopurinol (ZYLOPRIM) 100 MG tablet Take 1 tablet (100 mg total) by mouth daily. 90 tablet 3   No facility-administered medications prior to visit.     ROS: Review of Systems  Constitutional: Positive for fatigue. Negative for activity change, appetite change, chills and unexpected weight change.  HENT: Negative for congestion, mouth sores and sinus pressure.   Eyes: Negative for visual disturbance.  Respiratory: Negative for cough and chest tightness.     Gastrointestinal: Negative for abdominal pain and nausea.  Genitourinary: Negative for difficulty urinating, frequency and vaginal pain.  Musculoskeletal: Negative for back pain and gait problem.  Skin: Negative for pallor and rash.  Neurological: Negative for dizziness, tremors, weakness, numbness and headaches.  Psychiatric/Behavioral: Negative for confusion and sleep disturbance.    Objective:  BP 120/76 (BP Location: Left Arm, Patient Position: Sitting, Cuff Size: Normal)   Pulse 93   Temp 98.4 F (36.9 C) (Oral)   Ht 5\' 3"  (1.6 m)   Wt 176 lb (79.8 kg)   SpO2 95%   BMI 31.18 kg/m   BP Readings from Last 3 Encounters:  01/02/18 120/76  09/03/17 124/82  05/04/17 118/76    Wt Readings from Last 3 Encounters:  01/02/18 176 lb (79.8 kg)  09/03/17 175 lb (79.4 kg)  05/04/17 174 lb (78.9 kg)    Physical Exam  Constitutional: She appears well-developed. No distress.  HENT:  Head: Normocephalic.  Right Ear: External ear normal.  Left Ear: External ear normal.  Nose: Nose normal.  Mouth/Throat: Oropharynx is clear and moist.  Eyes: Pupils are equal, round, and reactive to light. Conjunctivae are normal. Right eye exhibits no discharge. Left eye exhibits no discharge.  Neck: Normal range of motion. Neck supple. No JVD present. No tracheal deviation present. No thyromegaly present.  Cardiovascular: Normal rate,  regular rhythm and normal heart sounds.  Pulmonary/Chest: No stridor. No respiratory distress. She has no wheezes.  Abdominal: Soft. Bowel sounds are normal. She exhibits no distension and no mass. There is no tenderness. There is no rebound and no guarding.  Musculoskeletal: She exhibits no edema or tenderness.  Lymphadenopathy:    She has no cervical adenopathy.  Neurological: She displays normal reflexes. No cranial nerve deficit. She exhibits normal muscle tone. Coordination normal.  Skin: No rash noted. No erythema.  Psychiatric: She has a normal mood and  affect. Her behavior is normal. Judgment and thought content normal.    Lab Results  Component Value Date   WBC 6.9 01/30/2017   HGB 15.2 01/30/2017   HCT 44.3 01/30/2017   PLT 225 01/30/2017   GLUCOSE 87 01/30/2017   CHOL 226 (H) 06/08/2015   TRIG 152.0 (H) 06/08/2015   HDL 53.80 06/08/2015   LDLDIRECT 145.3 01/27/2013   LDLCALC 142 (H) 06/08/2015   ALT 30 01/30/2017   AST 20 01/30/2017   NA 139 01/30/2017   K 4.0 01/30/2017   CL 102 01/02/2017   CREATININE 1.0 01/30/2017   BUN 23.1 01/30/2017   CO2 26 01/30/2017   TSH 1.90 06/21/2016    Mm Digital Screening Bilateral  Result Date: 10/14/2015 CLINICAL DATA:  Screening. EXAM: DIGITAL SCREENING BILATERAL MAMMOGRAM WITH CAD COMPARISON:  None. ACR Breast Density Category b: There are scattered areas of fibroglandular density. FINDINGS: There are no findings suspicious for malignancy. Images were processed with CAD. IMPRESSION: No mammographic evidence of malignancy. A result letter of this screening mammogram will be mailed directly to the patient. RECOMMENDATION: Screening mammogram in one year. (Code:SM-B-01Y) BI-RADS CATEGORY  1: Negative. Electronically Signed   By: Abelardo Diesel M.D.   On: 10/15/2015 12:16    Assessment & Plan:   There are no diagnoses linked to this encounter.   Meds ordered this encounter  Medications  . allopurinol (ZYLOPRIM) 100 MG tablet    Sig: Take 1 tablet (100 mg total) by mouth daily.    Dispense:  90 tablet    Refill:  3  . ibuprofen (ADVIL,MOTRIN) 600 MG tablet    Sig: Take 1 tablet (600 mg total) by mouth every 8 (eight) hours as needed for headache or mild pain.    Dispense:  30 tablet    Refill:  1     Follow-up: No follow-ups on file.  Walker Kehr, MD

## 2018-01-02 NOTE — Assessment & Plan Note (Signed)
Allopurinol  

## 2018-01-02 NOTE — Assessment & Plan Note (Signed)
Potassium

## 2018-01-02 NOTE — Patient Instructions (Signed)
MC well w/Jill 

## 2018-01-02 NOTE — Assessment & Plan Note (Addendum)
fatigue around 3 pm every day x months Labs

## 2018-01-08 ENCOUNTER — Telehealth: Payer: Self-pay | Admitting: Internal Medicine

## 2018-01-08 DIAGNOSIS — G479 Sleep disorder, unspecified: Secondary | ICD-10-CM

## 2018-01-08 NOTE — Telephone Encounter (Signed)
Copied from [place] [phone]. Topic: General - Other >> Jan 08, 2018  9:39 AM Lennox Solders wrote: Reason for CRM: pt was viewing her labs under patient result comment . Dr plotnikov   ask her a question is it possible she could have sleep apnea?. Please advise should pt see pulumonologist. Pt would like a nurse to explain there result of hemoglobin

## 2018-01-10 NOTE — Telephone Encounter (Signed)
LMTCB

## 2018-01-10 NOTE — Telephone Encounter (Signed)
Patient stated she does not sleep all night and finds herself waking up in the middle of the night. She states this has been going on for years. She would like to know what you think needs to be done.

## 2018-01-11 NOTE — Telephone Encounter (Signed)
Patient informed of MD response and stated understanding  

## 2018-01-11 NOTE — Telephone Encounter (Signed)
I'll ref her to see a specialist in Neurology/sleep Thx

## 2018-01-29 ENCOUNTER — Encounter: Payer: Self-pay | Admitting: Neurology

## 2018-01-29 ENCOUNTER — Ambulatory Visit (INDEPENDENT_AMBULATORY_CARE_PROVIDER_SITE_OTHER): Payer: Medicare Other | Admitting: Neurology

## 2018-01-29 VITALS — BP 115/77 | HR 84 | Ht 63.0 in | Wt 178.0 lb

## 2018-01-29 DIAGNOSIS — R718 Other abnormality of red blood cells: Secondary | ICD-10-CM

## 2018-01-29 DIAGNOSIS — R5383 Other fatigue: Secondary | ICD-10-CM | POA: Diagnosis not present

## 2018-01-29 DIAGNOSIS — R0683 Snoring: Secondary | ICD-10-CM | POA: Diagnosis not present

## 2018-01-29 DIAGNOSIS — N182 Chronic kidney disease, stage 2 (mild): Secondary | ICD-10-CM | POA: Insufficient documentation
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
## 2018-02-01 ENCOUNTER — Ambulatory Visit: Payer: Medicare Other

## 2018-02-06 ENCOUNTER — Ambulatory Visit (INDEPENDENT_AMBULATORY_CARE_PROVIDER_SITE_OTHER): Payer: Medicare Other

## 2018-02-06 DIAGNOSIS — Z23 Encounter for immunization: Secondary | ICD-10-CM | POA: Diagnosis not present

## 2018-03-02 DIAGNOSIS — Z79891 Long term (current) use of opiate analgesic: Secondary | ICD-10-CM | POA: Diagnosis not present

## 2018-03-02 DIAGNOSIS — M5030 Other cervical disc degeneration, unspecified cervical region: Secondary | ICD-10-CM | POA: Diagnosis not present

## 2018-03-02 DIAGNOSIS — M542 Cervicalgia: Secondary | ICD-10-CM | POA: Diagnosis not present

## 2018-03-02 DIAGNOSIS — M545 Low back pain: Secondary | ICD-10-CM | POA: Diagnosis not present

## 2018-03-02 DIAGNOSIS — M5136 Other intervertebral disc degeneration, lumbar region: Secondary | ICD-10-CM | POA: Diagnosis not present

## 2018-03-13 ENCOUNTER — Institutional Professional Consult (permissible substitution): Payer: Self-pay | Admitting: Neurology

## 2018-03-13 ENCOUNTER — Encounter

## 2018-03-17 ENCOUNTER — Ambulatory Visit (INDEPENDENT_AMBULATORY_CARE_PROVIDER_SITE_OTHER): Payer: Medicare Other | Admitting: Neurology

## 2018-03-17 DIAGNOSIS — G4761 Periodic limb movement disorder: Secondary | ICD-10-CM | POA: Diagnosis not present

## 2018-03-17 DIAGNOSIS — N182 Chronic kidney disease, stage 2 (mild): Secondary | ICD-10-CM

## 2018-03-17 DIAGNOSIS — R718 Other abnormality of red blood cells: Secondary | ICD-10-CM

## 2018-03-17 DIAGNOSIS — R0683 Snoring: Secondary | ICD-10-CM

## 2018-03-17 DIAGNOSIS — R5383 Other fatigue: Secondary | ICD-10-CM

## 2018-03-21 NOTE — Procedures (Signed)
PATIENT'S NAME:  Dawn Howard, Dawn Howard DOB:      03/17/2020      MR#:    [id number]     DATE OF RECORDING: 03/17/2018 REFERRING M.D.:  Lew Dawes, MD Study Performed:   Baseline Polysomnogram HISTORY:  Dawn Howard is a 67 y.o. female patient, seen on 01-29-2018 in referral from Dr. Alain Marion for a sleep evaluation after HGB elevation. Chief complaint according to patient: "I have a big dip in energy at 3-4 PM, and do not eat large lunches". 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  The patient endorsed the Epworth Sleepiness Scale at 1/24 points.   The patient's weight 178 pounds with a height of 63 (inches), resulting in a BMI of 31.6 kg/m2. The patient's neck circumference measured 14.8 inches.  CURRENT MEDICATIONS: Zyloprim, Coreg, colchicine, Vitamin D2, Pepcid, Norco, Advil, Multivitamins, Deltasone, Aldactone, Kenalog.   PROCEDURE:  This is a multichannel digital polysomnogram utilizing the Somnostar 11.2 system.  Electrodes and sensors were applied and monitored per AASM Specifications.   EEG, EOG, Chin and Limb EMG, were sampled at 200 Hz.  ECG, Snore and Nasal Pressure, Thermal Airflow, Respiratory Effort, CPAP Flow and Pressure, Oximetry was sampled at 50 Hz. Digital video and audio were recorded.      BASELINE STUDY: Lights Out was at 22:32 and Lights On at 05:17.  Total recording time (TRT) was 406 minutes, with a total sleep time (TST) of 284 minutes.   The patient's sleep latency was 33 minutes.  REM latency was 78.5 minutes.  The sleep efficiency was 70.0 %.     SLEEP ARCHITECTURE: WASO (Wake after sleep onset) was 111.5 minutes.  There were 27.5 minutes in Stage N1, 147.5 minutes Stage N2, 68 minutes Stage N3 and 41 minutes in Stage REM.  The percentage of Stage N1 was 9.7%, Stage N2 was 51.9%, Stage N3 was 23.9% and Stage R (REM sleep) was 14.4%.   RESPIRATORY ANALYSIS:  There were a total of 4  respiratory events:  0 obstructive apneas, 0 central apneas and 0 mixed apneas with a total of 0 apneas and an apnea index (AI) of 0 /hour. There were 4 hypopneas with a hypopnea index of .8 /hour. The patient also had 0 respiratory event related arousals (RERAs).    The total APNEA/HYPOPNEA INDEX (AHI) was 0.8 /hour and the total RESPIRATORY DISTURBANCE INDEX was 0.8 /hour.  1 event occurred in REM sleep and 6 events in NREM. The REM AHI was 1.5 /hour, versus a non-REM AHI of 0.7. The patient spent 40.5 minutes of total sleep time in the supine position and 244 minutes in non-supine. The supine AHI was 0.0 versus a non-supine AHI of 1.0/h.  OXYGEN SATURATION & C02:  The Wake baseline 02 saturation was 96%, with the lowest being 89%. Time spent below 89% saturation equaled 0 minutes.  The patient had a total of 44 Periodic Limb Movements.  The Periodic Limb Movement (PLM) index was 9.3 and the PLM Arousal index was 1.1/hour. The arousals were noted as: 32 were spontaneous, 24 were associated with PLMs, and 3 were associated with respiratory events.  Audio and video analysis did not show any abnormal or unusual movements, behaviors, phonations or vocalizations.  The patient was constantly moving, and her sleep extremely fragmented.  Snoring was not recorded. EKG was in keeping with normal sinus rhythm (NSR).  Post-study, the patient described her sleep as  the same as usual- restful !Marland Kitchen    IMPRESSION:  1. Periodic Limb Movement Disorder (PLMD) which did not cause the patient any subjective problems with sleep. Few arousals.   2. No cause for the increase in hemoglobin was found.    RECOMMENDATIONS: No intervention or follow up needed    I certify that I have reviewed the entire raw data recording prior to the issuance of this report in accordance with the Standards of Accreditation of the American Academy of Sleep Medicine (AASM)     Larey Seat, MD   03-21-2018  Diplomat, American Board  of Psychiatry and Neurology  Diplomat, American Board of [place] Director, [employer] [facility] [employer]

## 2018-03-22 ENCOUNTER — Ambulatory Visit (INDEPENDENT_AMBULATORY_CARE_PROVIDER_SITE_OTHER): Payer: Medicare Other | Admitting: Family

## 2018-03-22 ENCOUNTER — Ambulatory Visit (INDEPENDENT_AMBULATORY_CARE_PROVIDER_SITE_OTHER)
Admission: RE | Admit: 2018-03-22 | Discharge: 2018-03-22 | Disposition: A | Discharge status: Home / Self Care | Payer: Medicare Other | Source: Ambulatory Visit | Attending: Family | Admitting: Family

## 2018-03-22 ENCOUNTER — Encounter: Payer: Self-pay | Admitting: Family

## 2018-03-22 VITALS — BP 124/80 | HR 81 | Temp 98.1°F | Ht 63.0 in | Wt 175.0 lb

## 2018-03-22 DIAGNOSIS — G8929 Other chronic pain: Secondary | ICD-10-CM

## 2018-03-22 DIAGNOSIS — M25562 Pain in left knee: Secondary | ICD-10-CM | POA: Diagnosis not present

## 2018-03-22 DIAGNOSIS — M7989 Other specified soft tissue disorders: Secondary | ICD-10-CM | POA: Diagnosis not present
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
## 2018-03-25 ENCOUNTER — Other Ambulatory Visit: Payer: Self-pay | Admitting: Family

## 2018-03-25 ENCOUNTER — Telehealth: Payer: Self-pay | Admitting: *Deleted

## 2018-03-25 DIAGNOSIS — G8929 Other chronic pain: Secondary | ICD-10-CM

## 2018-03-25 DIAGNOSIS — M25562 Pain in left knee: Principal | ICD-10-CM

## 2018-03-25 NOTE — Telephone Encounter (Signed)
-----   Message from Gildardo Griffes, RN sent at 03/22/2018  1:36 PM EST -----   ----- Message ----- From: Larey Seat, MD Sent: 03/21/2018   5:41 PM EST To: Darleen Crocker, RN, Cassandria Anger, MD  Post-study, the patient described her sleep as the same as usual-  restful !Marland Kitchen    IMPRESSION:No Sleep Apnea, Hypoxemia, not even snoring.  1. Periodic Limb Movement Disorder (PLMD) which did not cause the  patient any subjective problems with sleep. Few arousals.  2. No cause for the increase in hemoglobin was found.    RECOMMENDATIONS: No intervention or follow up needed

## 2018-03-25 NOTE — Telephone Encounter (Signed)
Spoke with patient and discussed that she did have some restlessness during her sleep but it did not cause any problems while sleeping. Dr. Brett Fairy did not find any cause for the increase in hemoglobin. At this point there would be no need for her to follow up or have any interventions. Patient verbalized understanding and appreciation. She was made aware that she can always call back with any questions. She verbalized appreciation.

## 2018-03-29 ENCOUNTER — Ambulatory Visit (INDEPENDENT_AMBULATORY_CARE_PROVIDER_SITE_OTHER): Payer: Medicare Other | Admitting: *Deleted

## 2018-03-29 ENCOUNTER — Telehealth: Payer: Self-pay | Admitting: *Deleted

## 2018-03-29 VITALS — BP 121/76 | HR 77 | Resp 17 | Ht 63.0 in | Wt 176.0 lb

## 2018-03-29 DIAGNOSIS — Z0000 Encounter for general adult medical examination without abnormal findings: Secondary | ICD-10-CM | POA: Diagnosis not present

## 2018-03-29 DIAGNOSIS — Z1231 Encounter for screening mammogram for malignant neoplasm of breast: Secondary | ICD-10-CM | POA: Diagnosis not present

## 2018-03-29 DIAGNOSIS — E2839 Other primary ovarian failure: Secondary | ICD-10-CM | POA: Diagnosis not present

## 2018-03-29 NOTE — Progress Notes (Signed)
Medical screening examination/treatment/procedure(s) were performed by non-physician practitioner and as supervising physician I was immediately available for consultation/collaboration. I agree with above. Elizabeth A Crawford, MD 

## 2018-03-29 NOTE — Telephone Encounter (Signed)
During AWV, patient wanted to ask  Provider Valere Dross if she thought having the scheduled MRI to evaluate her left knee would be beneficial. Stating would having the MRI change the outcome of treatment based upon what was previously found or the x-ray.

## 2018-03-29 NOTE — Telephone Encounter (Signed)
The MRI would show a tear; we could cancel the MRI and just have her see her own orthopedist in follow-up.
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
## 2018-03-29 NOTE — Patient Instructions (Addendum)
Continue doing brain stimulating activities (puzzles, reading, adult coloring books, staying active) to keep memory sharp.   Continue to eat heart healthy diet (full of fruits, vegetables, whole grains, lean protein, water--limit salt, fat, and sugar intake) and increase physical activity as tolerated.   Dawn Howard , Thank you for taking time to come for your Medicare Wellness Visit. I appreciate your ongoing commitment to your health goals. Please review the following plan we discussed and let me know if I can assist you in the future.   These are the goals we discussed: Goals    . Patient Stated     Start to eat healthier, increase my physical activity, and stay involved with loving and helping my children and grandchildren.       This is a list of the screening recommended for you and due dates:  Health Maintenance  Topic Date Due  .  Hepatitis C: One time screening is recommended by Center for Disease Control  (CDC) for  adults born from [birth year] through 1967.   24-Jan-1951  . Cologuard (Stool DNA test)  07/28/1998  . DEXA scan (bone density measurement)  07/27/2013  . Mammogram  10/13/2017  . Tetanus Vaccine  09/27/2021  . Flu Shot  Completed  . Pneumonia vaccines  Completed   Health Maintenance, Female Adopting a healthy lifestyle and getting preventive care can go a long way to promote health and wellness. Talk with your health care provider about what schedule of regular examinations is right for you. This is a good chance for you to check in with your provider about disease prevention and staying healthy. In between checkups, there are plenty of things you can do on your own. Experts have done a lot of research about which lifestyle changes and preventive measures are most likely to keep you healthy. Ask your health care provider for more information. Weight and diet Eat a healthy diet  Be sure to include plenty of vegetables, fruits, low-fat dairy products, and lean  protein.  Do not eat a lot of foods high in solid fats, added sugars, or salt.  Get regular exercise. This is one of the most important things you can do for your health. ? Most adults should exercise for at least 150 minutes each week. The exercise should increase your heart rate and make you sweat (moderate-intensity exercise). ? Most adults should also do strengthening exercises at least twice a week. This is in addition to the moderate-intensity exercise.  Maintain a healthy weight  Body mass index (BMI) is a measurement that can be used to identify possible weight problems. It estimates body fat based on height and weight. Your health care provider can help determine your BMI and help you achieve or maintain a healthy weight.  For females 75 years of age and older: ? A BMI below 18.5 is considered underweight. ? A BMI of 18.5 to 24.9 is normal. ? A BMI of 25 to 29.9 is considered overweight. ? A BMI of 30 and above is considered obese.  Watch levels of cholesterol and blood lipids  You should start having your blood tested for lipids and cholesterol at 69 years of age, then have this test every 5 years.  You may need to have your cholesterol levels checked more often if: ? Your lipid or cholesterol levels are high. ? You are older than 69 years of age. ? You are at high risk for heart disease.  Cancer screening Lung Cancer  Lung cancer screening  is recommended for adults 80-69 years old who are at high risk for lung cancer because of a history of smoking.  A yearly low-dose CT scan of the lungs is recommended for people who: ? Currently smoke. ? Have quit within the past 15 years. ? Have at least a 30-pack-year history of smoking. A pack year is smoking an average of one pack of cigarettes a day for 1 year.  Yearly screening should continue until it has been 15 years since you quit.  Yearly screening should stop if you develop a health problem that would prevent you from  having lung cancer treatment.  Breast Cancer  Practice breast self-awareness. This means understanding how your breasts normally appear and feel.  It also means doing regular breast self-exams. Let your health care provider know about any changes, no matter how small.  If you are in your 20s or 30s, you should have a clinical breast exam (CBE) by a health care provider every 1-3 years as part of a regular health exam.  If you are 33 or older, have a CBE every year. Also consider having a breast X-ray (mammogram) every year.  If you have a family history of breast cancer, talk to your health care provider about genetic screening.  If you are at high risk for breast cancer, talk to your health care provider about having an MRI and a mammogram every year.  Breast cancer gene (BRCA) assessment is recommended for women who have family members with BRCA-related cancers. BRCA-related cancers include: ? Breast. ? Ovarian. ? Tubal. ? Peritoneal cancers.  Results of the assessment will determine the need for genetic counseling and BRCA1 and BRCA2 testing.  Cervical Cancer Your health care provider may recommend that you be screened regularly for cancer of the pelvic organs (ovaries, uterus, and vagina). This screening involves a pelvic examination, including checking for microscopic changes to the surface of your cervix (Pap test). You may be encouraged to have this screening done every 3 years, beginning at age 66.  For women ages 64-65, health care providers may recommend pelvic exams and Pap testing every 3 years, or they may recommend the Pap and pelvic exam, combined with testing for human papilloma virus (HPV), every 5 years. Some types of HPV increase your risk of cervical cancer. Testing for HPV may also be done on women of any age with unclear Pap test results.  Other health care providers may not recommend any screening for nonpregnant women who are considered low risk for pelvic cancer  and who do not have symptoms. Ask your health care provider if a screening pelvic exam is right for you.  If you have had past treatment for cervical cancer or a condition that could lead to cancer, you need Pap tests and screening for cancer for at least 20 years after your treatment. If Pap tests have been discontinued, your risk factors (such as having a new sexual partner) need to be reassessed to determine if screening should resume. Some women have medical problems that increase the chance of getting cervical cancer. In these cases, your health care provider may recommend more frequent screening and Pap tests.  Colorectal Cancer  This type of cancer can be detected and often prevented.  Routine colorectal cancer screening usually begins at 69 years of age and continues through 69 years of age.  Your health care provider may recommend screening at an earlier age if you have risk factors for colon cancer.  Your health care provider  may also recommend using home test kits to check for hidden blood in the stool.  A small camera at the end of a tube can be used to examine your colon directly (sigmoidoscopy or colonoscopy). This is done to check for the earliest forms of colorectal cancer.  Routine screening usually begins at age 65.  Direct examination of the colon should be repeated every 5-10 years through 69 years of age. However, you may need to be screened more often if early forms of precancerous polyps or small growths are found.  Skin Cancer  Check your skin from head to toe regularly.  Tell your health care provider about any new moles or changes in moles, especially if there is a change in a mole's shape or color.  Also tell your health care provider if you have a mole that is larger than the size of a pencil eraser.  Always use sunscreen. Apply sunscreen liberally and repeatedly throughout the day.  Protect yourself by wearing long sleeves, pants, a wide-brimmed hat, and  sunglasses whenever you are outside.  Heart disease, diabetes, and high blood pressure  High blood pressure causes heart disease and increases the risk of stroke. High blood pressure is more likely to develop in: ? People who have blood pressure in the high end of the normal range (130-139/85-89 mm Hg). ? People who are overweight or obese. ? People who are African American.  If you are 26-22 years of age, have your blood pressure checked every 3-5 years. If you are 29 years of age or older, have your blood pressure checked every year. You should have your blood pressure measured twice-once when you are at a hospital or clinic, and once when you are not at a hospital or clinic. Record the average of the two measurements. To check your blood pressure when you are not at a hospital or clinic, you can use: ? An automated blood pressure machine at a pharmacy. ? A home blood pressure monitor.  If you are between 16 years and 62 years old, ask your health care provider if you should take aspirin to prevent strokes.  Have regular diabetes screenings. This involves taking a blood sample to check your fasting blood sugar level. ? If you are at a normal weight and have a low risk for diabetes, have this test once every three years after 69 years of age. ? If you are overweight and have a high risk for diabetes, consider being tested at a younger age or more often. Preventing infection Hepatitis B  If you have a higher risk for hepatitis B, you should be screened for this virus. You are considered at high risk for hepatitis B if: ? You were born in a country where hepatitis B is common. Ask your health care provider which countries are considered high risk. ? Your parents were born in a high-risk country, and you have not been immunized against hepatitis B (hepatitis B vaccine). ? You have HIV or AIDS. ? You use needles to inject street drugs. ? You live with someone who has hepatitis B. ? You have  had sex with someone who has hepatitis B. ? You get hemodialysis treatment. ? You take certain medicines for conditions, including cancer, organ transplantation, and autoimmune conditions.  Hepatitis C  Blood testing is recommended for: ? Everyone born from [birth year] through 1965. ? Anyone with known risk factors for hepatitis C.  Sexually transmitted infections (STIs)  You should be screened for sexually transmitted infections (  STIs) including gonorrhea and chlamydia if: ? You are sexually active and are younger than 69 years of age. ? You are older than 69 years of age and your health care provider tells you that you are at risk for this type of infection. ? Your sexual activity has changed since you were last screened and you are at an increased risk for chlamydia or gonorrhea. Ask your health care provider if you are at risk.  If you do not have HIV, but are at risk, it may be recommended that you take a prescription medicine daily to prevent HIV infection. This is called pre-exposure prophylaxis (PrEP). You are considered at risk if: ? You are sexually active and do not regularly use condoms or know the HIV status of your partner(s). ? You take drugs by injection. ? You are sexually active with a partner who has HIV.  Talk with your health care provider about whether you are at high risk of being infected with HIV. If you choose to begin PrEP, you should first be tested for HIV. You should then be tested every 3 months for as long as you are taking PrEP. Pregnancy  If you are premenopausal and you may become pregnant, ask your health care provider about preconception counseling.  If you may become pregnant, take 400 to 800 micrograms (mcg) of folic acid every day.  If you want to prevent pregnancy, talk to your health care provider about birth control (contraception). Osteoporosis and menopause  Osteoporosis is a disease in which the bones lose minerals and strength with aging. This  can result in serious bone fractures. Your risk for osteoporosis can be identified using a bone density scan.  If you are 73 years of age or older, or if you are at risk for osteoporosis and fractures, ask your health care provider if you should be screened.  Ask your health care provider whether you should take a calcium or vitamin D supplement to lower your risk for osteoporosis.  Menopause may have certain physical symptoms and risks.  Hormone replacement therapy may reduce some of these symptoms and risks. Talk to your health care provider about whether hormone replacement therapy is right for you. Follow these instructions at home:  Schedule regular health, dental, and eye exams.  Stay current with your immunizations.  Do not use any tobacco products including cigarettes, chewing tobacco, or electronic cigarettes.  If you are pregnant, do not drink alcohol.  If you are breastfeeding, limit how much and how often you drink alcohol.  Limit alcohol intake to no more than 1 drink per day for nonpregnant women. One drink equals 12 ounces of beer, 5 ounces of [place], or 1 ounces of hard liquor.  Do not use street drugs.  Do not share needles.  Ask your health care provider for help if you need support or information about quitting drugs.  Tell your health care provider if you often feel depressed.  Tell your health care provider if you have ever been abused or do not feel safe at home. This information is not intended to replace advice given to you by your health care provider. Make sure you discuss any questions you have with your health care provider. Document Released: 11/07/2010 Document Revised: 09/30/2015 Document Reviewed: 01/26/2015 Elsevier Interactive Patient Education  2018 Graf are compounds that affect the level of uric acid in your body. A low-purine diet is a diet that is low in purines. Eating a  low-purine diet can prevent the  level of uric acid in your body from getting too high and causing gout or kidney stones or both. What do I need to know about this diet?  Choose low-purine foods. Examples of low-purine foods are listed in the next section.  Drink plenty of fluids, especially water. Fluids can help remove uric acid from your body. Try to drink 8-16 cups (1.9-3.8 L) a day.  Limit foods high in fat, especially saturated fat, as fat makes it harder for the body to get rid of uric acid. Foods high in saturated fat include pizza, cheese, ice cream, whole milk, fried foods, and gravies. Choose foods that are lower in fat and lean sources of protein. Use olive oil when cooking as it contains healthy fats that are not high in saturated fat.  Limit alcohol. Alcohol interferes with the elimination of uric acid from your body. If you are having a gout attack, avoid all alcohol.  Keep in mind that different people's bodies react differently to different foods. You will probably learn over time which foods do or do not affect you. If you discover that a food tends to cause your gout to flare up, avoid eating that food. You can more freely enjoy foods that do not cause problems. If you have any questions about a food item, talk to your dietitian or health care provider. Which foods are low, moderate, and high in purines? The following is a list of foods that are low, moderate, and high in purines. You can eat any amount of the foods that are low in purines. You may be able to have small amounts of foods that are moderate in purines. Ask your health care provider how much of a food moderate in purines you can have. Avoid foods high in purines. Grains  Foods low in purines: Enriched white bread, pasta, rice, cake, cornbread, popcorn.  Foods moderate in purines: Whole-grain breads and cereals, wheat germ, bran, oatmeal. Uncooked oatmeal. Dry wheat bran or wheat germ.  Foods high in purines: Pancakes, [place] toast, biscuits,  muffins. Vegetables  Foods low in purines: All vegetables, except those that are moderate in purines.  Foods moderate in purines: Asparagus, cauliflower, spinach, mushrooms, green peas. Fruits  All fruits are low in purines. Meats and other Protein Foods  Foods low in purines: Eggs, nuts, peanut butter.  Foods moderate in purines: 80-90% lean beef, lamb, veal, pork, poultry, fish, eggs, peanut butter, nuts. Crab, lobster, oysters, and shrimp. Cooked dried beans, peas, and lentils.  Foods high in purines: Anchovies, sardines, herring, mussels, tuna, codfish, scallops, trout, and haddock. Berniece Salines. Organ meats (such as liver or kidney). Tripe. Game meat. Goose. Sweetbreads. Dairy  All dairy foods are low in purines. Low-fat and fat-free dairy products are best because they are low in saturated fat. Beverages  Drinks low in purines: Water, carbonated beverages, tea, coffee, cocoa.  Drinks moderate in purines: Soft drinks and other drinks sweetened with high-fructose corn syrup. Juices. To find whether a food or drink is sweetened with high-fructose corn syrup, look at the ingredients list.  Drinks high in purines: Alcoholic beverages (such as beer). Condiments  Foods low in purines: Salt, herbs, olives, pickles, relishes, vinegar.  Foods moderate in purines: Butter, margarine, oils, mayonnaise. Fats and Oils  Foods low in purines: All types, except gravies and sauces made with meat.  Foods high in purines: Gravies and sauces made with meat. Other Foods  Foods low in purines: Sugars, sweets, gelatin. Cake.  Soups made without meat.  Foods moderate in purines: Meat-based or fish-based soups, broths, or bouillons. Foods and drinks sweetened with high-fructose corn syrup.  Foods high in purines: High-fat desserts (such as ice cream, cookies, cakes, pies, doughnuts, and chocolate). Contact your dietitian for more information on foods that are not listed here. This information is not  intended to replace advice given to you by your health care provider. Make sure you discuss any questions you have with your health care provider. Document Released: 08/19/2010 Document Revised: 09/30/2015 Document Reviewed: 03/31/2013 Elsevier Interactive Patient Education  2017 [employer].

## 2018-03-29 NOTE — Telephone Encounter (Signed)
Sent patient mychart message today

## 2018-04-01 NOTE — Telephone Encounter (Signed)
Message read by patient on 03/29/18.

## 2018-04-08 ENCOUNTER — Other Ambulatory Visit: Payer: Medicare Other

## 2018-04-15 ENCOUNTER — Encounter: Payer: Self-pay | Admitting: Internal Medicine

## 2018-04-15 ENCOUNTER — Other Ambulatory Visit (INDEPENDENT_AMBULATORY_CARE_PROVIDER_SITE_OTHER): Payer: Medicare Other

## 2018-04-15 ENCOUNTER — Ambulatory Visit (INDEPENDENT_AMBULATORY_CARE_PROVIDER_SITE_OTHER): Payer: Medicare Other | Admitting: Internal Medicine

## 2018-04-15 DIAGNOSIS — M25562 Pain in left knee: Secondary | ICD-10-CM

## 2018-04-15 DIAGNOSIS — Z0000 Encounter for general adult medical examination without abnormal findings: Secondary | ICD-10-CM

## 2018-04-15 DIAGNOSIS — N182 Chronic kidney disease, stage 2 (mild): Secondary | ICD-10-CM

## 2018-04-15 DIAGNOSIS — M150 Primary generalized (osteo)arthritis: Secondary | ICD-10-CM

## 2018-04-15 DIAGNOSIS — G8929 Other chronic pain: Secondary | ICD-10-CM | POA: Diagnosis not present

## 2018-04-15 DIAGNOSIS — G4700 Insomnia, unspecified: Secondary | ICD-10-CM

## 2018-04-15 DIAGNOSIS — I10 Essential (primary) hypertension: Secondary | ICD-10-CM

## 2018-04-15 DIAGNOSIS — M159 Polyosteoarthritis, unspecified: Secondary | ICD-10-CM

## 2018-04-15 DIAGNOSIS — M25569 Pain in unspecified knee: Secondary | ICD-10-CM | POA: Insufficient documentation

## 2018-04-15 DIAGNOSIS — M5440 Lumbago with sciatica, unspecified side: Secondary | ICD-10-CM

## 2018-04-15 LAB — BASIC METABOLIC PANEL
BUN: 26 mg/dL — AB (ref 6–23)
CHLORIDE: 104 meq/L (ref 96–112)
CO2: 24 mEq/L (ref 19–32)
CREATININE: 1.12 mg/dL (ref 0.40–1.20)
Calcium: 9.9 mg/dL (ref 8.4–10.5)
GFR: 51.16 mL/min — AB (ref >60.00)
Glucose, Bld: 96 mg/dL (ref 70–99)
POTASSIUM: 4 meq/L (ref 3.5–5.1)
Sodium: 137 mEq/L (ref 135–145)

## 2018-04-15 NOTE — Assessment & Plan Note (Addendum)
Coreg, Spironolactone Offered cardiac CT scan for calcium scoring

## 2018-04-15 NOTE — Assessment & Plan Note (Signed)
No OSA 

## 2018-04-15 NOTE — Assessment & Plan Note (Addendum)
Labs D/c NSAIDs

## 2018-04-15 NOTE — Patient Instructions (Signed)

## 2018-04-15 NOTE — Progress Notes (Signed)
Subjective:  Patient ID: Dawn Howard, female    DOB: 1951/02/23  Age: 67 y.o. MRN: [id number]  CC: No chief complaint on file.   HPI Dreamer Carillo presents for a 3 mo - f/u OA, HTN, gout f/u C/o L knee pain  Outpatient Medications Prior to Visit  Medication Sig Dispense Refill  . allopurinol (ZYLOPRIM) 100 MG tablet Take 1 tablet (100 mg total) by mouth daily. 90 tablet 3  . carvedilol (COREG) 12.5 MG tablet Take 0.5 tablets (6.25 mg total) by mouth 2 (two) times daily with a meal. 180 tablet 3  . colchicine 0.6 MG tablet Take 1 tablet (0.6 mg total) by mouth 2 (two) times daily. (Patient taking differently: Take 0.6 mg by mouth 2 (two) times daily as needed. ) 60 tablet 1  . ergocalciferol (VITAMIN D2) 50000 units capsule Take 1 capsule (50,000 Units total) by mouth once a week. 6 capsule 0  . famotidine (PEPCID) 20 MG tablet Take 1 tablet (20 mg total) by mouth 2 (two) times daily. (Patient taking differently: Take 20 mg by mouth 2 (two) times daily as needed. ) 60 tablet 5  . HYDROcodone-acetaminophen (NORCO) 7.5-325 MG tablet TK 1 T PO BID PRN 30 tablet 0  . ibuprofen (ADVIL,MOTRIN) 600 MG tablet Take 1 tablet (600 mg total) by mouth every 8 (eight) hours as needed for headache or mild pain. 30 tablet 1  . Multiple Vitamin (MULITIVITAMIN WITH MINERALS) TABS Take 1 tablet by mouth daily.    . predniSONE (DELTASONE) 10 MG tablet Take 40 mg on day 1; then 20 mg on day 2; and then 10 mg on days 3-4 prn gout. Take pc (Patient taking differently: as needed. Take 40 mg on day 1; then 20 mg on day 2; and then 10 mg on days 3-4 prn gout. Take pc) 40 tablet 1  . spironolactone (ALDACTONE) 50 MG tablet Take 1 tablet (50 mg total) by mouth daily. 90 tablet 3  . triamcinolone ointment (KENALOG) 0.5 % Apply 1 application topically 2 (two) times daily. 30 g 0   No facility-administered medications prior to visit.     ROS: Review of Systems  Constitutional: Negative for activity change,  appetite change, chills, fatigue and unexpected weight change.  HENT: Negative for congestion, mouth sores and sinus pressure.   Eyes: Negative for visual disturbance.  Respiratory: Negative for cough and chest tightness.   Gastrointestinal: Negative for abdominal pain and nausea.  Genitourinary: Negative for difficulty urinating, frequency and vaginal pain.  Musculoskeletal: Negative for back pain and gait problem.  Skin: Negative for pallor and rash.  Neurological: Negative for dizziness, tremors, weakness, numbness and headaches.  Psychiatric/Behavioral: Positive for sleep disturbance. Negative for confusion and suicidal ideas.    Objective:  BP 120/74 (BP Location: Left Arm, Patient Position: Sitting, Cuff Size: Normal)   Pulse 74   Temp 97.9 F (36.6 C) (Oral)   Ht 5\' 3"  (1.6 m)   Wt 175 lb (79.4 kg)   SpO2 96%   BMI 31.00 kg/m   BP Readings from Last 3 Encounters:  04/15/18 120/74  03/29/18 121/76  03/22/18 124/80    Wt Readings from Last 3 Encounters:  04/15/18 175 lb (79.4 kg)  03/29/18 176 lb (79.8 kg)  03/22/18 175 lb (79.4 kg)    Physical Exam  Constitutional: She appears well-developed. No distress.  HENT:  Head: Normocephalic.  Right Ear: External ear normal.  Left Ear: External ear normal.  Nose: Nose normal.  Mouth/Throat: Oropharynx  is clear and moist.  Eyes: Pupils are equal, round, and reactive to light. Conjunctivae are normal. Right eye exhibits no discharge. Left eye exhibits no discharge.  Neck: Normal range of motion. Neck supple. No JVD present. No tracheal deviation present. No thyromegaly present.  Cardiovascular: Normal rate, regular rhythm and normal heart sounds.  Pulmonary/Chest: No stridor. No respiratory distress. She has no wheezes.  Abdominal: Soft. Bowel sounds are normal. She exhibits no distension and no mass. There is no tenderness. There is no rebound and no guarding.  Musculoskeletal: She exhibits tenderness. She exhibits no  edema.  Lymphadenopathy:    She has no cervical adenopathy.  Neurological: She displays normal reflexes. No cranial nerve deficit. She exhibits normal muscle tone. Coordination normal.  Skin: No rash noted. No erythema.  Psychiatric: She has a normal mood and affect. Her behavior is normal. Judgment and thought content normal.  L b ancerine area is tender  Lab Results  Component Value Date   WBC 8.0 01/02/2018   HGB 15.8 (H) 01/02/2018   HCT 46.1 (H) 01/02/2018   PLT 275.0 01/02/2018   GLUCOSE 93 01/02/2018   CHOL 228 (H) 01/02/2018   TRIG 222.0 (H) 01/02/2018   HDL 46.80 01/02/2018   LDLDIRECT 160.0 01/02/2018   LDLCALC 142 (H) 06/08/2015   ALT 34 01/02/2018   AST 21 01/02/2018   NA 136 01/02/2018   K 4.2 01/02/2018   CL 104 01/02/2018   CREATININE 1.41 (H) 01/02/2018   BUN 30 (H) 01/02/2018   CO2 22 01/02/2018   TSH 1.85 01/02/2018    Dg Knee Complete 4 Views Left  Result Date: 03/22/2018 CLINICAL DATA:  Medial pain and swelling worsening over the past 6 weeks. No known injury. EXAM: LEFT KNEE - COMPLETE 4+ VIEW COMPARISON:  None. FINDINGS: The bones are subjectively adequately mineralized. There is no acute fracture nor dislocation. There is no lytic nor blastic lesion. There is mild degenerative narrowing of the patellofemoral joint with small spurs arising from the superior and lateral articular margins. There is no joint effusion. There is mild beaking of the tibial spines. IMPRESSION: There is no acute or significant chronic bony abnormality of the left knee. Electronically Signed   By: David  Martinique M.D.   On: 03/22/2018 16:01    Assessment & Plan:   There are no diagnoses linked to this encounter.   No orders of the defined types were placed in this encounter.    Follow-up: No follow-ups on file.  Walker Kehr, MD

## 2018-04-15 NOTE — Assessment & Plan Note (Signed)
Norco prn per Dr Nelva Bush  Potential benefits of a long term opioids use as well as potential risks (i.e. addiction risk, apnea etc) and complications (i.e. Somnolence, constipation and others) were explained to the patient and were aknowledged. She will try CBD

## 2018-04-15 NOTE — Assessment & Plan Note (Signed)
L knee  Ibuprofen - d/c due to CRF Norco prn - Dr Nelva Bush  Potential benefits of a long term opioids use as well as potential risks (i.e. addiction risk, apnea etc) and complications (i.e. Somnolence, constipation and others) were explained to the patient and were aknowledged.

## 2018-04-15 NOTE — Assessment & Plan Note (Signed)
Tylenol prn X ray - mild OA Knee MRI - pending

## 2018-04-25 ENCOUNTER — Ambulatory Visit
Admission: RE | Admit: 2018-04-25 | Discharge: 2018-04-25 | Disposition: A | Discharge status: Home / Self Care | Payer: Medicare Other | Source: Ambulatory Visit | Attending: Family | Admitting: Family

## 2018-04-25 DIAGNOSIS — M1712 Unilateral primary osteoarthritis, left knee: Secondary | ICD-10-CM | POA: Diagnosis not present

## 2018-04-25 DIAGNOSIS — G8929 Other chronic pain: Secondary | ICD-10-CM

## 2018-04-25 DIAGNOSIS — M25562 Pain in left knee: Principal | ICD-10-CM

## 2018-05-21 ENCOUNTER — Ambulatory Visit: Payer: Self-pay

## 2018-05-21 NOTE — Telephone Encounter (Signed)
Patient called and asked the questions below. I advised the testing is CT Cardiac Scoring Test as noted in the last OV note. I advised it is safe to take Melatonin and follow the instructions on the bottle, also advised to talk with the pharmacist about the dosage. Patient verbalized understanding.   Pt calling in requesting to speak with Dr. Judeen Hammans nurse. 1. Pt states that at last OV she was told there was some sort of scan that she could have that would cost $150 - she wants to know what the name of that test was so that she can check with her insurance to see if this is covered. 2. Pt wants to make sure it is safe for her to take melatonin with her other medications.        Reason for Disposition . Health Information question, no triage required and triager able to answer question  Protocols used: INFORMATION ONLY CALL-A-AH

## 2018-05-27 ENCOUNTER — Telehealth: Payer: Self-pay | Admitting: Internal Medicine

## 2018-05-27 DIAGNOSIS — N644 Mastodynia: Secondary | ICD-10-CM

## 2018-05-27 NOTE — Telephone Encounter (Signed)
Copied from [place] [id number]. Topic: Quick Communication - See Telephone Encounter >> May 27, 2018  4:45 PM Valla Leaver wrote: CRM for notification. See Telephone encounter for: 05/27/18. Patient needs her mammo that was cancelled due to right breast pain near rib, changed to a diagnostic mammo. Was supposed to have this done same day as bone density. Please advise if she can still get these done on the 23rd or if she needs to cancel?

## 2018-05-29 ENCOUNTER — Other Ambulatory Visit: Payer: Self-pay | Admitting: Internal Medicine

## 2018-05-29 DIAGNOSIS — N644 Mastodynia: Secondary | ICD-10-CM

## 2018-05-29 NOTE — Telephone Encounter (Signed)
mammo ordered, pt notified

## 2018-05-30 ENCOUNTER — Ambulatory Visit
Admission: RE | Admit: 2018-05-30 | Discharge: 2018-05-30 | Disposition: A | Discharge status: Home / Self Care | Payer: Medicare Other | Source: Ambulatory Visit | Attending: Internal Medicine | Admitting: Internal Medicine

## 2018-05-30 ENCOUNTER — Ambulatory Visit: Payer: Medicare Other

## 2018-05-30 DIAGNOSIS — Z780 Asymptomatic menopausal state: Secondary | ICD-10-CM | POA: Diagnosis not present

## 2018-05-30 DIAGNOSIS — E2839 Other primary ovarian failure: Secondary | ICD-10-CM

## 2018-05-30 DIAGNOSIS — M8589 Other specified disorders of bone density and structure, multiple sites: Secondary | ICD-10-CM | POA: Diagnosis not present

## 2018-06-04 ENCOUNTER — Ambulatory Visit
Admission: RE | Admit: 2018-06-04 | Discharge: 2018-06-04 | Disposition: A | Discharge status: Home / Self Care | Payer: Medicare Other | Source: Ambulatory Visit | Attending: Internal Medicine | Admitting: Internal Medicine

## 2018-06-04 DIAGNOSIS — N644 Mastodynia: Secondary | ICD-10-CM

## 2018-06-04 DIAGNOSIS — R928 Other abnormal and inconclusive findings on diagnostic imaging of breast: Secondary | ICD-10-CM | POA: Diagnosis not present

## 2018-06-10 ENCOUNTER — Telehealth: Payer: Self-pay | Admitting: Internal Medicine

## 2018-06-10 NOTE — Telephone Encounter (Signed)
Routing to dr plotnikov, please advise, thanks 

## 2018-06-10 NOTE — Telephone Encounter (Signed)
Copied from [place]. Topic: Quick Communication - Rx Refill/Question >> Jun 10, 2018 11:35 AM Scherrie Gerlach wrote: Medication: chlorpheniramine-HYDROcodone (TUSSIONEX PENNKINETIC ER) 10-8 MG/5ML LQCR (a cough med) Pt states Dr Arnoldo Morale prescribed this for her 35. Pt states she has had a dry cough for 4-5 wks.  Pt prefers not to come in with all the sickness in the office. Pt states she has had pna several times in the past and hopes she will not have to come in. (advised pt she needs appt, but she insisted I ask)  But will make appt if she HAS to.  [place], [place] [phone] (Phone) [phone] (Fax)

## 2018-06-11 NOTE — Telephone Encounter (Signed)
You can use over-the-counter   " Delsym" or" Robitussin" cough syrup varietis for cough. Use Halls or Ricola cough drops.   Please, make an appointment if you are not better or if you're worse.  Thx

## 2018-06-11 NOTE — Telephone Encounter (Signed)
Patient advised of dr plotnikovs note/instructions, she will try this and call back to schedule appt if no better

## 2018-06-12 ENCOUNTER — Ambulatory Visit (INDEPENDENT_AMBULATORY_CARE_PROVIDER_SITE_OTHER): Payer: Medicare Other | Admitting: Internal Medicine

## 2018-06-12 ENCOUNTER — Encounter: Payer: Self-pay | Admitting: Internal Medicine

## 2018-06-12 ENCOUNTER — Ambulatory Visit (INDEPENDENT_AMBULATORY_CARE_PROVIDER_SITE_OTHER)
Admission: RE | Admit: 2018-06-12 | Discharge: 2018-06-12 | Disposition: A | Discharge status: Home / Self Care | Payer: Medicare Other | Source: Ambulatory Visit | Attending: Internal Medicine | Admitting: Internal Medicine

## 2018-06-12 VITALS — BP 120/78 | HR 83 | Temp 98.5°F | Ht 63.0 in | Wt 168.0 lb

## 2018-06-12 DIAGNOSIS — N182 Chronic kidney disease, stage 2 (mild): Secondary | ICD-10-CM

## 2018-06-12 DIAGNOSIS — E559 Vitamin D deficiency, unspecified: Secondary | ICD-10-CM

## 2018-06-12 DIAGNOSIS — R05 Cough: Secondary | ICD-10-CM

## 2018-06-12 DIAGNOSIS — J209 Acute bronchitis, unspecified: Secondary | ICD-10-CM

## 2018-06-12 DIAGNOSIS — I10 Essential (primary) hypertension: Secondary | ICD-10-CM

## 2018-06-12 DIAGNOSIS — R059 Cough, unspecified: Secondary | ICD-10-CM

## 2018-06-12 MED ORDER — AZITHROMYCIN 250 MG PO TABS: ORAL_TABLET | ORAL | 0 refills | Status: DC

## 2018-06-12 MED ORDER — HYDROCODONE-HOMATROPINE 5-1.5 MG/5ML PO SYRP: 5.0000 mL | ORAL_SOLUTION | Freq: Three times a day (TID) | ORAL | 0 refills | Status: DC | PRN

## 2018-06-12 NOTE — Assessment & Plan Note (Addendum)
BP Readings from Last 3 Encounters:  06/12/18 120/78  04/15/18 120/74  03/29/18 121/76   On Coreg, Spironolactone

## 2018-06-12 NOTE — Assessment & Plan Note (Signed)
No NSAID. 

## 2018-06-12 NOTE — Progress Notes (Signed)
Subjective:  Patient ID: Dawn Howard, female    DOB: 09-Aug-1950  Age: 68 y.o. MRN: [id number]  CC: No chief complaint on file.   HPI Dawn Howard presents for R earache, ST C/o cough x 3 weeks Feeling worse  Outpatient Medications Prior to Visit  Medication Sig Dispense Refill  . allopurinol (ZYLOPRIM) 100 MG tablet Take 1 tablet (100 mg total) by mouth daily. 90 tablet 3  . carvedilol (COREG) 12.5 MG tablet Take 0.5 tablets (6.25 mg total) by mouth 2 (two) times daily with a meal. 180 tablet 3  . colchicine 0.6 MG tablet Take 1 tablet (0.6 mg total) by mouth 2 (two) times daily. (Patient taking differently: Take 0.6 mg by mouth 2 (two) times daily as needed. ) 60 tablet 1  . ergocalciferol (VITAMIN D2) 50000 units capsule Take 1 capsule (50,000 Units total) by mouth once a week. 6 capsule 0  . famotidine (PEPCID) 20 MG tablet Take 1 tablet (20 mg total) by mouth 2 (two) times daily. (Patient taking differently: Take 20 mg by mouth 2 (two) times daily as needed. ) 60 tablet 5  . HYDROcodone-acetaminophen (NORCO) 7.5-325 MG tablet TK 1 T PO BID PRN 30 tablet 0  . ibuprofen (ADVIL,MOTRIN) 600 MG tablet Take 1 tablet (600 mg total) by mouth every 8 (eight) hours as needed for headache or mild pain. 30 tablet 1  . Multiple Vitamin (MULITIVITAMIN WITH MINERALS) TABS Take 1 tablet by mouth daily.    Marland Kitchen spironolactone (ALDACTONE) 50 MG tablet Take 1 tablet (50 mg total) by mouth daily. 90 tablet 3  . triamcinolone ointment (KENALOG) 0.5 % Apply 1 application topically 2 (two) times daily. 30 g 0   No facility-administered medications prior to visit.     ROS: Review of Systems  Constitutional: Positive for fatigue. Negative for activity change, appetite change, chills and unexpected weight change.  HENT: Positive for congestion, ear pain and sinus pain. Negative for mouth sores and sinus pressure.   Eyes: Negative for visual disturbance.  Respiratory: Positive for cough. Negative  for chest tightness.   Gastrointestinal: Negative for abdominal pain and nausea.  Genitourinary: Negative for difficulty urinating, frequency and vaginal pain.  Musculoskeletal: Negative for back pain and gait problem.  Skin: Negative for pallor and rash.  Neurological: Positive for weakness. Negative for dizziness, tremors, numbness and headaches.  Psychiatric/Behavioral: Negative for confusion and sleep disturbance.    Objective:  BP 120/78 (BP Location: Left Arm, Patient Position: Sitting, Cuff Size: Normal)   Pulse 83   Temp 98.5 F (36.9 C) (Oral)   Ht 5\' 3"  (1.6 m)   Wt 168 lb (76.2 kg)   SpO2 98%   BMI 29.76 kg/m   BP Readings from Last 3 Encounters:  06/12/18 120/78  04/15/18 120/74  03/29/18 121/76    Wt Readings from Last 3 Encounters:  06/12/18 168 lb (76.2 kg)  04/15/18 175 lb (79.4 kg)  03/29/18 176 lb (79.8 kg)    Physical Exam Constitutional:      General: She is not in acute distress.    Appearance: She is well-developed.  HENT:     Head: Normocephalic.     Right Ear: External ear normal.     Left Ear: External ear normal.     Nose: Congestion present.     Mouth/Throat:     Pharynx: Posterior oropharyngeal erythema present.  Eyes:     General:        Right eye: No discharge.  Left eye: No discharge.     Conjunctiva/sclera: Conjunctivae normal.     Pupils: Pupils are equal, round, and reactive to light.  Neck:     Musculoskeletal: Normal range of motion and neck supple.     Thyroid: No thyromegaly.     Vascular: No JVD.     Trachea: No tracheal deviation.  Cardiovascular:     Rate and Rhythm: Normal rate and regular rhythm.     Heart sounds: Normal heart sounds.  Pulmonary:     Effort: No respiratory distress.     Breath sounds: No stridor. No wheezing.  Abdominal:     General: Bowel sounds are normal. There is no distension.     Palpations: Abdomen is soft. There is no mass.     Tenderness: There is no abdominal tenderness. There is  no guarding or rebound.  Musculoskeletal:        General: No tenderness.  Lymphadenopathy:     Cervical: No cervical adenopathy.  Skin:    Findings: No erythema or rash.  Neurological:     Cranial Nerves: No cranial nerve deficit.     Motor: No abnormal muscle tone.     Coordination: Coordination normal.     Deep Tendon Reflexes: Reflexes normal.  Psychiatric:        Behavior: Behavior normal.        Thought Content: Thought content normal.        Judgment: Judgment normal.   eryth throat coughing  Lab Results  Component Value Date   WBC 8.0 01/02/2018   HGB 15.8 (H) 01/02/2018   HCT 46.1 (H) 01/02/2018   PLT 275.0 01/02/2018   GLUCOSE 96 04/15/2018   CHOL 228 (H) 01/02/2018   TRIG 222.0 (H) 01/02/2018   HDL 46.80 01/02/2018   LDLDIRECT 160.0 01/02/2018   LDLCALC 142 (H) 06/08/2015   ALT 34 01/02/2018   AST 21 01/02/2018   NA 137 04/15/2018   K 4.0 04/15/2018   CL 104 04/15/2018   CREATININE 1.12 04/15/2018   BUN 26 (H) 04/15/2018   CO2 24 04/15/2018   TSH 1.85 01/02/2018    US Breast Ltd Uni Right Inc Axilla  Result Date: 06/04/2018 CLINICAL DATA:  68 year old female presenting for evaluation of pain in the inferior right breast for the past 2 weeks which has been improving. EXAM: DIGITAL DIAGNOSTIC BILATERAL MAMMOGRAM WITH CAD AND TOMO ULTRASOUND RIGHT BREAST COMPARISON:  Previous exam(s). ACR Breast Density Category a: The breast tissue is almost entirely fatty. FINDINGS: No suspicious calcifications, masses or areas of distortion are seen in the bilateral breasts. Mammographic images were processed with CAD. On physical exam, no suspicious palpable masses are identified in inferior right breast. Targeted ultrasound is performed, showing normal fibroglandular tissue in the inferior right breast. No masses or suspicious areas of shadowing are identified. IMPRESSION: 1. There are no mammographic or targeted sonographic abnormalities in the inferior right breast to  explain the patient's breast pain. 2.  No mammographic evidence of malignancy in the bilateral breasts. RECOMMENDATION: 1. Clinical follow-up recommended for the tender area of concern in the inferior right breast. Any further workup should be based on clinical grounds. 2.  Screening mammogram in one year.(Code:SM-B-01Y) I have discussed the findings and recommendations with the patient. Results were also provided in writing at the conclusion of the visit. If applicable, a reminder letter will be sent to the patient regarding the next appointment. BI-RADS CATEGORY  1: Negative. Electronically Signed   By: Ammie Ferrier  M.D.   On: 06/04/2018 10:23   Mm Diag Breast Tomo Bilateral  Result Date: 06/04/2018 CLINICAL DATA:  68 year old female presenting for evaluation of pain in the inferior right breast for the past 2 weeks which has been improving. EXAM: DIGITAL DIAGNOSTIC BILATERAL MAMMOGRAM WITH CAD AND TOMO ULTRASOUND RIGHT BREAST COMPARISON:  Previous exam(s). ACR Breast Density Category a: The breast tissue is almost entirely fatty. FINDINGS: No suspicious calcifications, masses or areas of distortion are seen in the bilateral breasts. Mammographic images were processed with CAD. On physical exam, no suspicious palpable masses are identified in inferior right breast. Targeted ultrasound is performed, showing normal fibroglandular tissue in the inferior right breast. No masses or suspicious areas of shadowing are identified. IMPRESSION: 1. There are no mammographic or targeted sonographic abnormalities in the inferior right breast to explain the patient's breast pain. 2.  No mammographic evidence of malignancy in the bilateral breasts. RECOMMENDATION: 1. Clinical follow-up recommended for the tender area of concern in the inferior right breast. Any further workup should be based on clinical grounds. 2.  Screening mammogram in one year.(Code:SM-B-01Y) I have discussed the findings and recommendations with the  patient. Results were also provided in writing at the conclusion of the visit. If applicable, a reminder letter will be sent to the patient regarding the next appointment. BI-RADS CATEGORY  1: Negative. Electronically Signed   By: Ammie Ferrier M.D.   On: 06/04/2018 10:23    Assessment & Plan:   There are no diagnoses linked to this encounter.   No orders of the defined types were placed in this encounter.    Follow-up: No follow-ups on file.  Walker Kehr, MD

## 2018-06-12 NOTE — Assessment & Plan Note (Signed)
Zpac CXR Hycodan syr

## 2018-06-12 NOTE — Assessment & Plan Note (Signed)
No NSAIDs 

## 2018-06-12 NOTE — Assessment & Plan Note (Signed)
On Vit D 

## 2018-06-26 DIAGNOSIS — M5136 Other intervertebral disc degeneration, lumbar region: Secondary | ICD-10-CM | POA: Diagnosis not present

## 2018-06-26 DIAGNOSIS — Z79899 Other long term (current) drug therapy: Secondary | ICD-10-CM | POA: Diagnosis not present

## 2018-06-26 DIAGNOSIS — Z5181 Encounter for therapeutic drug level monitoring: Secondary | ICD-10-CM | POA: Diagnosis not present

## 2018-06-26 DIAGNOSIS — M545 Low back pain: Secondary | ICD-10-CM | POA: Diagnosis not present

## 2018-06-26 DIAGNOSIS — Z79891 Long term (current) use of opiate analgesic: Secondary | ICD-10-CM | POA: Diagnosis not present

## 2018-06-26 DIAGNOSIS — M542 Cervicalgia: Secondary | ICD-10-CM | POA: Diagnosis not present

## 2018-06-26 DIAGNOSIS — M5030 Other cervical disc degeneration, unspecified cervical region: Secondary | ICD-10-CM | POA: Diagnosis not present

## 2018-07-18 ENCOUNTER — Ambulatory Visit (INDEPENDENT_AMBULATORY_CARE_PROVIDER_SITE_OTHER): Payer: Medicare Other | Admitting: Internal Medicine

## 2018-07-18 ENCOUNTER — Encounter: Payer: Self-pay | Admitting: Internal Medicine

## 2018-07-18 ENCOUNTER — Other Ambulatory Visit: Payer: Self-pay

## 2018-07-18 ENCOUNTER — Other Ambulatory Visit (INDEPENDENT_AMBULATORY_CARE_PROVIDER_SITE_OTHER): Payer: Medicare Other

## 2018-07-18 VITALS — BP 116/74 | HR 88 | Temp 98.7°F | Ht 63.0 in | Wt 172.0 lb

## 2018-07-18 DIAGNOSIS — E785 Hyperlipidemia, unspecified: Secondary | ICD-10-CM

## 2018-07-18 DIAGNOSIS — N182 Chronic kidney disease, stage 2 (mild): Secondary | ICD-10-CM | POA: Diagnosis not present

## 2018-07-18 DIAGNOSIS — J209 Acute bronchitis, unspecified: Secondary | ICD-10-CM

## 2018-07-18 DIAGNOSIS — M5440 Lumbago with sciatica, unspecified side: Secondary | ICD-10-CM

## 2018-07-18 DIAGNOSIS — E559 Vitamin D deficiency, unspecified: Secondary | ICD-10-CM

## 2018-07-18 DIAGNOSIS — H6690 Otitis media, unspecified, unspecified ear: Secondary | ICD-10-CM | POA: Insufficient documentation

## 2018-07-18 DIAGNOSIS — I10 Essential (primary) hypertension: Secondary | ICD-10-CM

## 2018-07-18 DIAGNOSIS — D751 Secondary polycythemia: Secondary | ICD-10-CM

## 2018-07-18 DIAGNOSIS — H66005 Acute suppurative otitis media without spontaneous rupture of ear drum, recurrent, left ear: Secondary | ICD-10-CM | POA: Diagnosis not present

## 2018-07-18 DIAGNOSIS — G8929 Other chronic pain: Secondary | ICD-10-CM

## 2018-07-18 LAB — LIPID PANEL
Cholesterol: 200 mg/dL (ref 0–200)
HDL: 39.7 mg/dL (ref >39.00)
LDL Cholesterol: 124 mg/dL — ABNORMAL HIGH (ref 0–99)
NONHDL: 160.56
Total CHOL/HDL Ratio: 5
Triglycerides: 185 mg/dL — ABNORMAL HIGH (ref 0.0–149.0)
VLDL: 37 mg/dL (ref 0.0–40.0)

## 2018-07-18 LAB — CBC WITH DIFFERENTIAL/PLATELET
Basophils Absolute: 0.1 10*3/uL (ref 0.0–0.1)
Basophils Relative: 1.2 % (ref 0.0–3.0)
EOS ABS: 0.2 10*3/uL (ref 0.0–0.7)
Eosinophils Relative: 2.5 % (ref 0.0–5.0)
HEMATOCRIT: 44.5 % (ref 36.0–46.0)
Hemoglobin: 15.1 g/dL — ABNORMAL HIGH (ref 12.0–15.0)
Lymphocytes Relative: 26.3 % (ref 12.0–46.0)
Lymphs Abs: 2 10*3/uL (ref 0.7–4.0)
MCHC: 33.9 g/dL (ref 30.0–36.0)
MCV: 89.7 fl (ref 78.0–100.0)
Monocytes Absolute: 0.4 10*3/uL (ref 0.1–1.0)
Monocytes Relative: 5.7 % (ref 3.0–12.0)
Neutro Abs: 4.9 10*3/uL (ref 1.4–7.7)
Neutrophils Relative %: 64.3 % (ref 43.0–77.0)
Platelets: 266 10*3/uL (ref 150.0–400.0)
RBC: 4.97 Mil/uL (ref 3.87–5.11)
RDW: 13.1 % (ref 11.5–15.5)
WBC: 7.7 10*3/uL (ref 4.0–10.5)

## 2018-07-18 LAB — BASIC METABOLIC PANEL
BUN: 28 mg/dL — ABNORMAL HIGH (ref 6–23)
CO2: 24 meq/L (ref 19–32)
Calcium: 9.9 mg/dL (ref 8.4–10.5)
Chloride: 105 mEq/L (ref 96–112)
Creatinine, Ser: 1.11 mg/dL (ref 0.40–1.20)
GFR: 48.6 mL/min — ABNORMAL LOW (ref >60.00)
Glucose, Bld: 89 mg/dL (ref 70–99)
Potassium: 4 mEq/L (ref 3.5–5.1)
Sodium: 137 mEq/L (ref 135–145)

## 2018-07-18 MED ORDER — CEFDINIR 300 MG PO CAPS: 300.0000 mg | ORAL_CAPSULE | Freq: Two times a day (BID) | ORAL | 0 refills | Status: DC

## 2018-07-18 MED ORDER — HYDROCODONE-HOMATROPINE 5-1.5 MG/5ML PO SYRP: 5.0000 mL | ORAL_SOLUTION | Freq: Three times a day (TID) | ORAL | 0 refills | Status: DC | PRN

## 2018-07-18 NOTE — Assessment & Plan Note (Signed)
Cefdinir po Hycodan

## 2018-07-18 NOTE — Assessment & Plan Note (Signed)
On Vit D 

## 2018-07-18 NOTE — Assessment & Plan Note (Addendum)
No NSAIDs Dawn Howard is taking a low dose Ibuprofen prn - she understands risks

## 2018-07-18 NOTE — Progress Notes (Signed)
Subjective:  Patient ID: Dawn Howard, female    DOB: 01-10-1951  Age: 68 y.o. MRN: [id number]  CC: No chief complaint on file.   HPI Dawn Howard presents for R earache, HTN, gout f/u  Outpatient Medications Prior to Visit  Medication Sig Dispense Refill  . allopurinol (ZYLOPRIM) 100 MG tablet Take 1 tablet (100 mg total) by mouth daily. 90 tablet 3  . carvedilol (COREG) 12.5 MG tablet Take 0.5 tablets (6.25 mg total) by mouth 2 (two) times daily with a meal. (Patient taking differently: Take 6.25 mg by mouth daily. ) 180 tablet 3  . colchicine 0.6 MG tablet Take 1 tablet (0.6 mg total) by mouth 2 (two) times daily. (Patient taking differently: Take 0.6 mg by mouth 2 (two) times daily as needed. ) 60 tablet 1  . ergocalciferol (VITAMIN D2) 50000 units capsule Take 1 capsule (50,000 Units total) by mouth once a week. 6 capsule 0  . famotidine (PEPCID) 20 MG tablet Take 1 tablet (20 mg total) by mouth 2 (two) times daily. (Patient taking differently: Take 20 mg by mouth 2 (two) times daily as needed. ) 60 tablet 5  . HYDROcodone-acetaminophen (NORCO) 7.5-325 MG tablet TK 1 T PO BID PRN 30 tablet 0  . ibuprofen (ADVIL,MOTRIN) 600 MG tablet Take 1 tablet (600 mg total) by mouth every 8 (eight) hours as needed for headache or mild pain. 30 tablet 1  . Multiple Vitamin (MULITIVITAMIN WITH MINERALS) TABS Take 1 tablet by mouth daily.    Marland Kitchen spironolactone (ALDACTONE) 50 MG tablet Take 1 tablet (50 mg total) by mouth daily. 90 tablet 3  . triamcinolone ointment (KENALOG) 0.5 % Apply 1 application topically 2 (two) times daily. 30 g 0  . azithromycin (ZITHROMAX Z-PAK) 250 MG tablet As directed 6 tablet 0  . HYDROcodone-homatropine (HYCODAN) 5-1.5 MG/5ML syrup Take 5 mLs by mouth every 8 (eight) hours as needed for cough. (Patient not taking: Reported on 07/18/2018) 240 mL 0   No facility-administered medications prior to visit.     ROS: Review of Systems  Constitutional: Negative for  activity change, appetite change, chills, fatigue and unexpected weight change.  HENT: Positive for ear pain. Negative for congestion, mouth sores and sinus pressure.   Eyes: Negative for visual disturbance.  Respiratory: Negative for cough and chest tightness.   Gastrointestinal: Negative for abdominal pain and nausea.  Genitourinary: Negative for difficulty urinating, frequency and vaginal pain.  Musculoskeletal: Negative for back pain and gait problem.  Skin: Negative for pallor and rash.  Neurological: Negative for dizziness, tremors, weakness, numbness and headaches.  Psychiatric/Behavioral: Negative for confusion and sleep disturbance.    Objective:  BP 116/74 (BP Location: Left Arm, Patient Position: Sitting, Cuff Size: Normal)   Pulse 88   Temp 98.7 F (37.1 C) (Oral)   Ht 5\' 3"  (1.6 m)   Wt 172 lb (78 kg)   SpO2 99%   BMI 30.47 kg/m   BP Readings from Last 3 Encounters:  07/18/18 116/74  06/12/18 120/78  04/15/18 120/74    Wt Readings from Last 3 Encounters:  07/18/18 172 lb (78 kg)  06/12/18 168 lb (76.2 kg)  04/15/18 175 lb (79.4 kg)    Physical Exam Constitutional:      General: She is not in acute distress.    Appearance: She is well-developed.  HENT:     Head: Normocephalic.     Right Ear: External ear normal.     Left Ear: External ear normal.  Nose: Nose normal.  Eyes:     General:        Right eye: No discharge.        Left eye: No discharge.     Conjunctiva/sclera: Conjunctivae normal.     Pupils: Pupils are equal, round, and reactive to light.  Neck:     Musculoskeletal: Normal range of motion and neck supple.     Thyroid: No thyromegaly.     Vascular: No JVD.     Trachea: No tracheal deviation.  Cardiovascular:     Rate and Rhythm: Normal rate and regular rhythm.     Heart sounds: Normal heart sounds.  Pulmonary:     Effort: No respiratory distress.     Breath sounds: No stridor. No wheezing.  Abdominal:     General: Bowel sounds  are normal. There is no distension.     Palpations: Abdomen is soft. There is no mass.     Tenderness: There is no abdominal tenderness. There is no guarding or rebound.  Musculoskeletal:        General: No tenderness.  Lymphadenopathy:     Cervical: No cervical adenopathy.  Skin:    Findings: No erythema or rash.  Neurological:     Cranial Nerves: No cranial nerve deficit.     Motor: No abnormal muscle tone.     Coordination: Coordination normal.     Deep Tendon Reflexes: Reflexes normal.  Psychiatric:        Behavior: Behavior normal.        Thought Content: Thought content normal.        Judgment: Judgment normal.     Lab Results  Component Value Date   WBC 8.0 01/02/2018   HGB 15.8 (H) 01/02/2018   HCT 46.1 (H) 01/02/2018   PLT 275.0 01/02/2018   GLUCOSE 96 04/15/2018   CHOL 228 (H) 01/02/2018   TRIG 222.0 (H) 01/02/2018   HDL 46.80 01/02/2018   LDLDIRECT 160.0 01/02/2018   LDLCALC 142 (H) 06/08/2015   ALT 34 01/02/2018   AST 21 01/02/2018   NA 137 04/15/2018   K 4.0 04/15/2018   CL 104 04/15/2018   CREATININE 1.12 04/15/2018   BUN 26 (H) 04/15/2018   CO2 24 04/15/2018   TSH 1.85 01/02/2018    Dg Chest 2 View  Result Date: 06/12/2018 CLINICAL DATA:  Nonproductive cough. EXAM: CHEST - 2 VIEW COMPARISON:  09/09/2015 FINDINGS: The heart size and pulmonary vascularity are normal. No infiltrates or effusions. 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 Old healed fracture of the posterior aspect of the left fifth rib. No acute bone abnormality. IMPRESSION: No active cardiopulmonary disease. Electronically Signed   By: Lorriane Shire M.D.   On: 06/12/2018 17:10    Assessment & Plan:   There are no diagnoses linked to this encounter.   No orders of the defined types were placed in this encounter.    Follow-up: No follow-ups on file.  Walker Kehr, MD

## 2018-07-18 NOTE — Assessment & Plan Note (Signed)
CBC

## 2018-07-18 NOTE — Patient Instructions (Signed)
You can use over-the-counter  "cold" medicines  such as  "Afrin" nasal spray for nasal congestion as directed. Use " Delsym" or" Robitussin" cough syrup varietis for cough.  You can use plain "Tylenol" or "Advil" for fever, chills and achyness. Use Halls or Ricola cough drops.     Please, make an appointment if you are not better or if you're worse.  

## 2018-07-18 NOTE — Assessment & Plan Note (Signed)
On Coreg, Spironolactone 

## 2018-07-18 NOTE — Assessment & Plan Note (Addendum)
F/u w/Dr Young Berry is taking a low dose Ibuprofen prn - she understands risks in CRF

## 2018-08-16 ENCOUNTER — Ambulatory Visit: Payer: Self-pay | Admitting: Internal Medicine

## 2018-08-16 DIAGNOSIS — R6889 Other general symptoms and signs: Secondary | ICD-10-CM

## 2018-08-16 NOTE — Telephone Encounter (Signed)
Pt. Calling to report her temp. Has been running lower than normal x 2 weeks. 95.1, 95.5. . With a new thermometer. No symptoms. Reassured pt. This was not a symptom of COVID 19. Would like to know what Dr. Alain Marion thinks. He can call or send a message via My Chart.

## 2018-08-20 NOTE — Telephone Encounter (Signed)
We can check a UA OV or VOV if sx's Thx

## 2018-08-20 NOTE — Telephone Encounter (Signed)
Please advise 

## 2018-08-21 NOTE — Addendum Note (Signed)
Addended by: Karren Cobble on: 08/21/2018 04:43 PM   Modules accepted: Orders

## 2018-08-22 ENCOUNTER — Other Ambulatory Visit (INDEPENDENT_AMBULATORY_CARE_PROVIDER_SITE_OTHER): Payer: Medicare Other

## 2018-08-22 DIAGNOSIS — R6889 Other general symptoms and signs: Secondary | ICD-10-CM

## 2018-08-22 LAB — URINALYSIS, ROUTINE W REFLEX MICROSCOPIC
Bilirubin Urine: NEGATIVE
Ketones, ur: NEGATIVE
Leukocytes,Ua: NEGATIVE
Nitrite: NEGATIVE
Specific Gravity, Urine: 1.025 (ref 1.000–1.030)
Total Protein, Urine: NEGATIVE
Urine Glucose: NEGATIVE
Urobilinogen, UA: 0.2 (ref 0.0–1.0)
WBC, UA: NONE SEEN (ref 0–?)
pH: 6 (ref 5.0–8.0)

## 2018-08-24 LAB — URINE CULTURE
MICRO NUMBER:: 400367
SPECIMEN QUALITY:: ADEQUATE

## 2018-10-29 ENCOUNTER — Other Ambulatory Visit: Payer: Self-pay | Admitting: Internal Medicine

## 2018-10-30 DIAGNOSIS — Z79891 Long term (current) use of opiate analgesic: Secondary | ICD-10-CM | POA: Diagnosis not present

## 2018-10-30 DIAGNOSIS — M5136 Other intervertebral disc degeneration, lumbar region: Secondary | ICD-10-CM | POA: Diagnosis not present

## 2018-10-30 DIAGNOSIS — M25561 Pain in right knee: Secondary | ICD-10-CM | POA: Diagnosis not present

## 2018-11-18 DIAGNOSIS — M238X1 Other internal derangements of right knee: Secondary | ICD-10-CM | POA: Diagnosis not present

## 2018-11-18 DIAGNOSIS — M25561 Pain in right knee: Secondary | ICD-10-CM | POA: Diagnosis not present

## 2018-11-28 ENCOUNTER — Telehealth: Payer: Self-pay | Admitting: *Deleted

## 2018-11-28 NOTE — Telephone Encounter (Signed)
Script Screening patients for COVID-19 and reviewing new operational procedures  Greeting - The reason I am calling is to share with you some new changes to our processes that are designed to help us keep everyone safe. Is now a good time to speak with you? Patient says "no' - ask them when you can call back and let them know it's important to do this prior to their appointment.  Patient says "yes" - Great, Caitlyn the first thing I need to do is ask you some screening Questions.  1. To the best of your knowledge, have you been in close contact with any one with a confirmed diagnosis of COVID 19? o No - proceed to next question  2. Have you had any one or more of the following: fever, chills, cough, shortness of breath or any flu-like symptoms? o No - proceed to next question  3. Have you been diagnosed with or have a previous diagnosis of COVID 19? o No - proceed to next question  4. I am going to go over a few other symptoms with you. Please let me know if you are experiencing any of the following: . Ear, nose or throat discomfort . A sore throat . Headache . Muscle pain . Diarrhea . Loss of taste or smell o No - proceed to next question  Thank you for answering these questions. Please know we will ask you these questions or similar questions when you arrive for your appointment and again it's how we are keeping everyone safe. Also, to keep you safe, please use the provided hand sanitizer when you enter the building. Saliyah, we are asking everyone in the building to wear a mask because they help us prevent the spread of germs. Do you have a mask of your own, if not, we are happy to provide one for you. The last thing I want to go over with you is the no visitor guidelines. This means no one can attend the appointment with you unless you need physical assistance. I understand this may be different from your past appointments and I know this may be difficult but please know if  someone is driving you we are happy to call them for you once your appointment is over.  [INSERT SITE SPECIFIC CHECK IN PROCEDURES]  Misbah I've given you a lot of information, what questions do you have about what I've talked about today or your appointment tomorrow? 

## 2018-11-29 ENCOUNTER — Other Ambulatory Visit: Payer: Medicare Other

## 2018-11-29 ENCOUNTER — Ambulatory Visit (INDEPENDENT_AMBULATORY_CARE_PROVIDER_SITE_OTHER)
Admission: RE | Admit: 2018-11-29 | Discharge: 2018-11-29 | Disposition: A | Discharge status: Home / Self Care | Payer: Self-pay | Source: Ambulatory Visit | Attending: Internal Medicine | Admitting: Internal Medicine

## 2018-11-29 ENCOUNTER — Other Ambulatory Visit: Payer: Self-pay

## 2018-11-29 DIAGNOSIS — E785 Hyperlipidemia, unspecified: Secondary | ICD-10-CM

## 2018-12-16 DIAGNOSIS — H04123 Dry eye syndrome of bilateral lacrimal glands: Secondary | ICD-10-CM | POA: Diagnosis not present

## 2018-12-16 DIAGNOSIS — Z961 Presence of intraocular lens: Secondary | ICD-10-CM | POA: Diagnosis not present

## 2018-12-16 DIAGNOSIS — H17821 Peripheral opacity of cornea, right eye: Secondary | ICD-10-CM | POA: Diagnosis not present

## 2018-12-16 DIAGNOSIS — H26492 Other secondary cataract, left eye: Secondary | ICD-10-CM | POA: Diagnosis not present

## 2018-12-16 DIAGNOSIS — H0102A Squamous blepharitis right eye, upper and lower eyelids: Secondary | ICD-10-CM | POA: Diagnosis not present

## 2018-12-16 DIAGNOSIS — H0102B Squamous blepharitis left eye, upper and lower eyelids: Secondary | ICD-10-CM | POA: Diagnosis not present

## 2018-12-16 DIAGNOSIS — H43813 Vitreous degeneration, bilateral: Secondary | ICD-10-CM | POA: Diagnosis not present

## 2019-01-01 DIAGNOSIS — M2241 Chondromalacia patellae, right knee: Secondary | ICD-10-CM | POA: Diagnosis not present

## 2019-01-01 DIAGNOSIS — M238X1 Other internal derangements of right knee: Secondary | ICD-10-CM | POA: Diagnosis not present

## 2019-01-01 DIAGNOSIS — M25561 Pain in right knee: Secondary | ICD-10-CM | POA: Diagnosis not present

## 2019-01-21 ENCOUNTER — Other Ambulatory Visit: Payer: Self-pay

## 2019-01-21 ENCOUNTER — Other Ambulatory Visit (INDEPENDENT_AMBULATORY_CARE_PROVIDER_SITE_OTHER): Payer: Medicare Other

## 2019-01-21 ENCOUNTER — Encounter: Payer: Self-pay | Admitting: Internal Medicine

## 2019-01-21 ENCOUNTER — Ambulatory Visit (INDEPENDENT_AMBULATORY_CARE_PROVIDER_SITE_OTHER): Payer: Medicare Other | Admitting: Internal Medicine

## 2019-01-21 VITALS — BP 120/76 | HR 88 | Temp 97.8°F | Ht 63.0 in | Wt 175.0 lb

## 2019-01-21 DIAGNOSIS — I10 Essential (primary) hypertension: Secondary | ICD-10-CM

## 2019-01-21 DIAGNOSIS — E559 Vitamin D deficiency, unspecified: Secondary | ICD-10-CM

## 2019-01-21 DIAGNOSIS — M5440 Lumbago with sciatica, unspecified side: Secondary | ICD-10-CM

## 2019-01-21 DIAGNOSIS — J4520 Mild intermittent asthma, uncomplicated: Secondary | ICD-10-CM | POA: Diagnosis not present

## 2019-01-21 DIAGNOSIS — Z23 Encounter for immunization: Secondary | ICD-10-CM

## 2019-01-21 DIAGNOSIS — M25562 Pain in left knee: Secondary | ICD-10-CM

## 2019-01-21 DIAGNOSIS — G8929 Other chronic pain: Secondary | ICD-10-CM

## 2019-01-21 LAB — BASIC METABOLIC PANEL
BUN: 24 mg/dL — ABNORMAL HIGH (ref 6–23)
CO2: 26 mEq/L (ref 19–32)
Calcium: 10.4 mg/dL (ref 8.4–10.5)
Chloride: 104 mEq/L (ref 96–112)
Creatinine, Ser: 1.02 mg/dL (ref 0.40–1.20)
GFR: 53.5 mL/min — ABNORMAL LOW (ref >60.00)
Glucose, Bld: 88 mg/dL (ref 70–99)
Potassium: 4.2 mEq/L (ref 3.5–5.1)
Sodium: 138 mEq/L (ref 135–145)

## 2019-01-21 NOTE — Progress Notes (Signed)
Subjective:  Patient ID: [name], female    DOB: [birth year]  Age: 68 y.o. MRN: [id number]  CC: No chief complaint on file.   HPI Kirbi Sirmon presents for HTN, gout, GERD f/u  Outpatient Medications Prior to Visit  Medication Sig Dispense Refill  . allopurinol (ZYLOPRIM) 100 MG tablet Take 1 tablet (100 mg total) by mouth daily. 90 tablet 3  . carvedilol (COREG) 12.5 MG tablet Take 0.5 tablets (6.25 mg total) by mouth 2 (two) times daily with a meal. (Patient taking differently: Take 6.25 mg by mouth daily. ) 180 tablet 3  . cefdinir (OMNICEF) 300 MG capsule Take 1 capsule (300 mg total) by mouth 2 (two) times daily. 20 capsule 0  . colchicine 0.6 MG tablet Take 1 tablet (0.6 mg total) by mouth 2 (two) times daily. (Patient taking differently: Take 0.6 mg by mouth 2 (two) times daily as needed. ) 60 tablet 1  . ergocalciferol (VITAMIN D2) 50000 units capsule Take 1 capsule (50,000 Units total) by mouth once a week. 6 capsule 0  . famotidine (PEPCID) 20 MG tablet Take 1 tablet (20 mg total) by mouth 2 (two) times daily. (Patient taking differently: Take 20 mg by mouth 2 (two) times daily as needed. ) 60 tablet 5  . HYDROcodone-acetaminophen (NORCO) 7.5-325 MG tablet TK 1 T PO BID PRN 30 tablet 0  . HYDROcodone-homatropine (HYCODAN) 5-1.5 MG/5ML syrup Take 5 mLs by mouth every 8 (eight) hours as needed for cough. 240 mL 0  . ibuprofen (ADVIL,MOTRIN) 600 MG tablet Take 1 tablet (600 mg total) by mouth every 8 (eight) hours as needed for headache or mild pain. 30 tablet 1  . Multiple Vitamin (MULITIVITAMIN WITH MINERALS) TABS Take 1 tablet by mouth daily.    Marland Kitchen spironolactone (ALDACTONE) 50 MG tablet TAKE 1 TABLET ONCE DAILY. 90 tablet 3  . triamcinolone ointment (KENALOG) 0.5 % Apply 1 application topically 2 (two) times daily. 30 g 0   No facility-administered medications prior to visit.     ROS: Review of Systems  Constitutional: Negative for activity change, appetite  change, chills, fatigue and unexpected weight change.  HENT: Negative for congestion, mouth sores and sinus pressure.   Eyes: Negative for visual disturbance.  Respiratory: Negative for cough and chest tightness.   Gastrointestinal: Negative for abdominal pain and nausea.  Genitourinary: Negative for difficulty urinating, frequency and vaginal pain.  Musculoskeletal: Negative for back pain and gait problem.  Skin: Negative for pallor and rash.  Neurological: Negative for dizziness, tremors, weakness, numbness and headaches.  Psychiatric/Behavioral: Negative for confusion, sleep disturbance and suicidal ideas.    Objective:  BP 120/76 (BP Location: Left Arm, Patient Position: Sitting, Cuff Size: Normal)   Pulse 88   Temp 97.8 F (36.6 C) (Oral)   Ht 5\' 3"  (1.6 m)   Wt 175 lb (79.4 kg)   SpO2 97%   BMI 31.00 kg/m   BP Readings from Last 3 Encounters:  01/21/19 120/76  07/18/18 116/74  06/12/18 120/78    Wt Readings from Last 3 Encounters:  01/21/19 175 lb (79.4 kg)  07/18/18 172 lb (78 kg)  06/12/18 168 lb (76.2 kg)    Physical Exam Constitutional:      General: She is not in acute distress.    Appearance: She is well-developed.  HENT:     Head: Normocephalic.     Right Ear: External ear normal.     Left Ear: External ear normal.  Nose: Nose normal.  Eyes:     General:        Right eye: No discharge.        Left eye: No discharge.     Conjunctiva/sclera: Conjunctivae normal.     Pupils: Pupils are equal, round, and reactive to light.  Neck:     Musculoskeletal: Normal range of motion and neck supple.     Thyroid: No thyromegaly.     Vascular: No JVD.     Trachea: No tracheal deviation.  Cardiovascular:     Rate and Rhythm: Normal rate and regular rhythm.     Heart sounds: Normal heart sounds.  Pulmonary:     Effort: No respiratory distress.     Breath sounds: No stridor. No wheezing.  Abdominal:     General: Bowel sounds are normal. There is no  distension.     Palpations: Abdomen is soft. There is no mass.     Tenderness: There is no abdominal tenderness. There is no guarding or rebound.  Musculoskeletal:        General: No tenderness.  Lymphadenopathy:     Cervical: No cervical adenopathy.  Skin:    Findings: No erythema or rash.  Neurological:     Cranial Nerves: No cranial nerve deficit.     Motor: No abnormal muscle tone.     Coordination: Coordination normal.     Deep Tendon Reflexes: Reflexes normal.  Psychiatric:        Behavior: Behavior normal.        Thought Content: Thought content normal.        Judgment: Judgment normal.     Lab Results  Component Value Date   WBC 7.7 07/18/2018   HGB 15.1 (H) 07/18/2018   HCT 44.5 07/18/2018   PLT 266.0 07/18/2018   GLUCOSE 89 07/18/2018   CHOL 200 07/18/2018   TRIG 185.0 (H) 07/18/2018   HDL 39.70 07/18/2018   LDLDIRECT 160.0 01/02/2018   LDLCALC 124 (H) 07/18/2018   ALT 34 01/02/2018   AST 21 01/02/2018   NA 137 07/18/2018   K 4.0 07/18/2018   CL 105 07/18/2018   CREATININE 1.11 07/18/2018   BUN 28 (H) 07/18/2018   CO2 24 07/18/2018   TSH 1.85 01/02/2018    Ct Cardiac Scoring  Addendum Date: 11/29/2018   ADDENDUM REPORT: 11/29/2018 12:49 CLINICAL DATA:  Risk stratification EXAM: Coronary Calcium Score TECHNIQUE: The patient was scanned on a [employer] scanner. Axial non-contrast 3 mm slices were carried out through the heart. The data set was analyzed on a dedicated work station and scored using the [place]. FINDINGS: Non-cardiac: See separate report from [facility]. Ascending Aorta: Normal size, trivial calcifications. Pericardium: Normal. Coronary arteries: Normal origin. IMPRESSION: 1. Coronary calcium score of 0. This was 0 percentile for age and sex matched control. 2.  Mitral annular calcifications. Electronically Signed   By: Ena Dawley   On: 11/29/2018 12:49   Result Date: 11/29/2018 EXAM: OVER-READ INTERPRETATION  CT CHEST  The following report is an over-read performed by radiologist Dr. Rolm Baptise of [facility], [place] on 11/29/2018. This over-read does not include interpretation of cardiac or coronary anatomy or pathology. The coronary calcium score interpretation by the cardiologist is attached. COMPARISON:  None. FINDINGS: Vascular: Heart is normal size.  Visualized aorta normal caliber. Mediastinum/Nodes: Numerous calcified mediastinal and hilar lymph nodes. No adenopathy. Lungs/Pleura: Numerous bilateral calcified granulomas. No confluent airspace opacity, suspicious pulmonary nodule or effusion in the visualized lungs. Upper  Abdomen: Imaging into the upper abdomen shows no acute findings. Musculoskeletal: Chest wall soft tissues are unremarkable. No acute bony abnormality. IMPRESSION: Old granulomatous disease. No acute extra cardiac abnormality. Electronically Signed: By: Rolm Baptise M.D. On: 11/29/2018 11:23    Assessment & Plan:   There are no diagnoses linked to this encounter.   No orders of the defined types were placed in this encounter.    Follow-up: No follow-ups on file.  Walker Kehr, MD

## 2019-01-21 NOTE — Patient Instructions (Signed)

## 2019-01-21 NOTE — Assessment & Plan Note (Addendum)
Worse Seeing Dr Alvan Dame - Dr Clayton Lefort prn  Potential benefits of a long term opioids use as well as potential risks (i.e. addiction risk, apnea etc) and complications (i.e. Somnolence, constipation and others) were explained to the patient and were aknowledged.

## 2019-01-21 NOTE — Assessment & Plan Note (Signed)
Vit D 

## 2019-01-21 NOTE — Assessment & Plan Note (Signed)
On Coreg, Spironolactone 

## 2019-01-21 NOTE — Assessment & Plan Note (Signed)
Norco prn  Potential benefits of a long term opioids use as well as potential risks (i.e. addiction risk, apnea etc) and complications (i.e. Somnolence, constipation and others) were explained to the patient and were aknowledged. Lyndell is taking a low dose Ibuprofen prn - she understands risks in CRF 

## 2019-01-21 NOTE — Assessment & Plan Note (Signed)
2020 -- Coronary calcium score of 0. Mitral annular calcifications.

## 2019-01-23 ENCOUNTER — Other Ambulatory Visit: Payer: Self-pay | Admitting: Internal Medicine

## 2019-02-12 DIAGNOSIS — M2391 Unspecified internal derangement of right knee: Secondary | ICD-10-CM | POA: Diagnosis not present

## 2019-02-12 DIAGNOSIS — M25561 Pain in right knee: Secondary | ICD-10-CM | POA: Diagnosis not present

## 2019-03-05 DIAGNOSIS — Z79891 Long term (current) use of opiate analgesic: Secondary | ICD-10-CM | POA: Diagnosis not present

## 2019-03-05 DIAGNOSIS — M5136 Other intervertebral disc degeneration, lumbar region: Secondary | ICD-10-CM | POA: Diagnosis not present

## 2019-03-05 DIAGNOSIS — M542 Cervicalgia: Secondary | ICD-10-CM | POA: Diagnosis not present

## 2019-04-08 ENCOUNTER — Ambulatory Visit: Payer: Medicare Other

## 2019-04-23 ENCOUNTER — Other Ambulatory Visit (INDEPENDENT_AMBULATORY_CARE_PROVIDER_SITE_OTHER): Payer: Medicare Other

## 2019-04-23 ENCOUNTER — Encounter: Payer: Self-pay | Admitting: Internal Medicine

## 2019-04-23 ENCOUNTER — Ambulatory Visit (INDEPENDENT_AMBULATORY_CARE_PROVIDER_SITE_OTHER): Payer: Medicare Other | Admitting: Internal Medicine

## 2019-04-23 ENCOUNTER — Other Ambulatory Visit: Payer: Self-pay

## 2019-04-23 VITALS — BP 124/82 | HR 73 | Temp 97.6°F | Ht 63.0 in | Wt 177.0 lb

## 2019-04-23 DIAGNOSIS — R202 Paresthesia of skin: Secondary | ICD-10-CM

## 2019-04-23 DIAGNOSIS — F419 Anxiety disorder, unspecified: Secondary | ICD-10-CM

## 2019-04-23 DIAGNOSIS — I10 Essential (primary) hypertension: Secondary | ICD-10-CM | POA: Diagnosis not present

## 2019-04-23 DIAGNOSIS — E559 Vitamin D deficiency, unspecified: Secondary | ICD-10-CM | POA: Diagnosis not present

## 2019-04-23 DIAGNOSIS — H00012 Hordeolum externum right lower eyelid: Secondary | ICD-10-CM

## 2019-04-23 DIAGNOSIS — G8929 Other chronic pain: Secondary | ICD-10-CM

## 2019-04-23 DIAGNOSIS — M5440 Lumbago with sciatica, unspecified side: Secondary | ICD-10-CM

## 2019-04-23 DIAGNOSIS — H00019 Hordeolum externum unspecified eye, unspecified eyelid: Secondary | ICD-10-CM | POA: Insufficient documentation

## 2019-04-23 LAB — BASIC METABOLIC PANEL
BUN: 22 mg/dL (ref 6–23)
CO2: 26 mEq/L (ref 19–32)
Calcium: 10.2 mg/dL (ref 8.4–10.5)
Chloride: 105 mEq/L (ref 96–112)
Creatinine, Ser: 1.05 mg/dL (ref 0.40–1.20)
GFR: 51.7 mL/min — ABNORMAL LOW (ref >60.00)
Glucose, Bld: 92 mg/dL (ref 70–99)
Potassium: 4.1 mEq/L (ref 3.5–5.1)
Sodium: 138 mEq/L (ref 135–145)

## 2019-04-23 LAB — VITAMIN B12: Vitamin B-12: 455 pg/mL (ref 211–911)

## 2019-04-23 MED ORDER — ERYTHROMYCIN 5 MG/GM OP OINT: 1.0000 "application " | TOPICAL_OINTMENT | Freq: Three times a day (TID) | OPHTHALMIC | 0 refills | Status: DC

## 2019-04-23 NOTE — Assessment & Plan Note (Signed)
Chronic   On Coreg, Spironolactone

## 2019-04-23 NOTE — Assessment & Plan Note (Signed)
Xanax prn - very rare use

## 2019-04-23 NOTE — Assessment & Plan Note (Signed)
MSK Norco prn  Potential benefits of a long term opioids use as well as potential risks (i.e. addiction risk, apnea etc) and complications (i.e. Somnolence, constipation and others) were explained to the patient and were aknowledged. Dawn Howard is taking a low dose Ibuprofen prn - she understands risks in CRF

## 2019-04-23 NOTE — Assessment & Plan Note (Signed)
Vit D 

## 2019-04-23 NOTE — Progress Notes (Signed)
Subjective:  Patient ID: Dawn Howard, female    DOB: 12-13-1950  Age: 68 y.o. MRN: [id number]  CC: No chief complaint on file.   HPI Sherald Lucci presents for HTN, gout, Vit d def f/u  Outpatient Medications Prior to Visit  Medication Sig Dispense Refill  . allopurinol (ZYLOPRIM) 100 MG tablet TAKE 1 TABLET EACH DAY. 90 tablet 3  . carvedilol (COREG) 12.5 MG tablet Take 0.5 tablets (6.25 mg total) by mouth 2 (two) times daily with a meal. (Patient taking differently: Take 6.25 mg by mouth daily. ) 180 tablet 3  . colchicine 0.6 MG tablet Take 1 tablet (0.6 mg total) by mouth 2 (two) times daily. (Patient taking differently: Take 0.6 mg by mouth 2 (two) times daily as needed. ) 60 tablet 1  . ergocalciferol (VITAMIN D2) 50000 units capsule Take 1 capsule (50,000 Units total) by mouth once a week. 6 capsule 0  . famotidine (PEPCID) 20 MG tablet Take 1 tablet (20 mg total) by mouth 2 (two) times daily. (Patient taking differently: Take 20 mg by mouth 2 (two) times daily as needed. ) 60 tablet 5  . HYDROcodone-acetaminophen (NORCO) 7.5-325 MG tablet TK 1 T PO BID PRN 30 tablet 0  . Multiple Vitamin (MULITIVITAMIN WITH MINERALS) TABS Take 1 tablet by mouth daily.    Marland Kitchen spironolactone (ALDACTONE) 50 MG tablet TAKE 1 TABLET ONCE DAILY. 90 tablet 3  . triamcinolone ointment (KENALOG) 0.5 % Apply 1 application topically 2 (two) times daily. 30 g 0   No facility-administered medications prior to visit.    ROS: Review of Systems  Constitutional: Negative for activity change, appetite change, chills, fatigue and unexpected weight change.  HENT: Negative for congestion, mouth sores and sinus pressure.   Eyes: Negative for visual disturbance.  Respiratory: Negative for cough and chest tightness.   Gastrointestinal: Negative for abdominal pain and nausea.  Genitourinary: Negative for difficulty urinating, frequency and vaginal pain.  Musculoskeletal: Positive for arthralgias. Negative for  back pain and gait problem.  Skin: Negative for pallor and rash.  Neurological: Negative for dizziness, tremors, weakness, numbness and headaches.  Psychiatric/Behavioral: Negative for confusion, sleep disturbance and suicidal ideas.    Objective:  BP 124/82 (BP Location: Left Arm, Patient Position: Sitting, Cuff Size: Normal)   Pulse 73   Temp 97.6 F (36.4 C) (Oral)   Ht 5\' 3"  (1.6 m)   Wt 177 lb (80.3 kg)   SpO2 96%   BMI 31.35 kg/m   BP Readings from Last 3 Encounters:  04/23/19 124/82  01/21/19 120/76  07/18/18 116/74    Wt Readings from Last 3 Encounters:  04/23/19 177 lb (80.3 kg)  01/21/19 175 lb (79.4 kg)  07/18/18 172 lb (78 kg)    Physical Exam Constitutional:      General: She is not in acute distress.    Appearance: She is well-developed.  HENT:     Head: Normocephalic.     Right Ear: External ear normal.     Left Ear: External ear normal.     Nose: Nose normal.  Eyes:     General:        Right eye: No discharge.        Left eye: No discharge.     Conjunctiva/sclera: Conjunctivae normal.     Pupils: Pupils are equal, round, and reactive to light.  Neck:     Thyroid: No thyromegaly.     Vascular: No JVD.     Trachea: No  tracheal deviation.  Cardiovascular:     Rate and Rhythm: Normal rate and regular rhythm.     Heart sounds: Normal heart sounds.  Pulmonary:     Effort: No respiratory distress.     Breath sounds: No stridor. No wheezing.  Abdominal:     General: Bowel sounds are normal. There is no distension.     Palpations: Abdomen is soft. There is no mass.     Tenderness: There is no abdominal tenderness. There is no guarding or rebound.  Musculoskeletal:        General: Tenderness present.     Cervical back: Normal range of motion and neck supple.  Lymphadenopathy:     Cervical: No cervical adenopathy.  Skin:    Findings: No erythema or rash.  Neurological:     Mental Status: She is oriented to person, place, and time.     Cranial  Nerves: No cranial nerve deficit.     Motor: No abnormal muscle tone.     Coordination: Coordination normal.     Deep Tendon Reflexes: Reflexes normal.  Psychiatric:        Behavior: Behavior normal.        Thought Content: Thought content normal.        Judgment: Judgment normal.   R lower eyelid small stye  Lab Results  Component Value Date   WBC 7.7 07/18/2018   HGB 15.1 (H) 07/18/2018   HCT 44.5 07/18/2018   PLT 266.0 07/18/2018   GLUCOSE 88 01/21/2019   CHOL 200 07/18/2018   TRIG 185.0 (H) 07/18/2018   HDL 39.70 07/18/2018   LDLDIRECT 160.0 01/02/2018   LDLCALC 124 (H) 07/18/2018   ALT 34 01/02/2018   AST 21 01/02/2018   NA 138 01/21/2019   K 4.2 01/21/2019   CL 104 01/21/2019   CREATININE 1.02 01/21/2019   BUN 24 (H) 01/21/2019   CO2 26 01/21/2019   TSH 1.85 01/02/2018    CT CARDIAC SCORING  Addendum Date: 11/29/2018   ADDENDUM REPORT: 11/29/2018 12:49 CLINICAL DATA:  Risk stratification EXAM: Coronary Calcium Score TECHNIQUE: The patient was scanned on a [employer] scanner. Axial non-contrast 3 mm slices were carried out through the heart. The data set was analyzed on a dedicated work station and scored using the [place]. FINDINGS: Non-cardiac: See separate report from [facility]. Ascending Aorta: Normal size, trivial calcifications. Pericardium: Normal. Coronary arteries: Normal origin. IMPRESSION: 1. Coronary calcium score of 0. This was 0 percentile for age and sex matched control. 2.  Mitral annular calcifications. Electronically Signed   By: Ena Dawley   On: 11/29/2018 12:49   Result Date: 11/29/2018 EXAM: OVER-READ INTERPRETATION  CT CHEST The following report is an over-read performed by radiologist Dr. Rolm Baptise of [facility], [place] on 11/29/2018. This over-read does not include interpretation of cardiac or coronary anatomy or pathology. The coronary calcium score interpretation by the cardiologist is attached. COMPARISON:  None.  FINDINGS: Vascular: Heart is normal size.  Visualized aorta normal caliber. Mediastinum/Nodes: Numerous calcified mediastinal and hilar lymph nodes. No adenopathy. Lungs/Pleura: Numerous bilateral calcified granulomas. No confluent airspace opacity, suspicious pulmonary nodule or effusion in the visualized lungs. Upper Abdomen: Imaging into the upper abdomen shows no acute findings. Musculoskeletal: Chest wall soft tissues are unremarkable. No acute bony abnormality. IMPRESSION: Old granulomatous disease. No acute extra cardiac abnormality. Electronically Signed: By: Rolm Baptise M.D. On: 11/29/2018 11:23    Assessment & Plan:   There are no diagnoses linked to this  encounter.   No orders of the defined types were placed in this encounter.    Follow-up: No follow-ups on file.  Walker Kehr, MD

## 2019-04-23 NOTE — Assessment & Plan Note (Signed)
R lower eyelid Erythro oint tid

## 2019-05-27 ENCOUNTER — Telehealth: Payer: Self-pay

## 2019-05-27 NOTE — Telephone Encounter (Signed)
Copied from [place] [id number]. Topic: General - Other >> May 26, 2019  4:20 PM Rainey Pines A wrote: Patient was advised to callback if sty on eye didn't get better and needs an oral antibiotic called in. Patient would like callback from nurse today. Please advise

## 2019-05-28 MED ORDER — DOXYCYCLINE HYCLATE 100 MG PO TABS: 100.0000 mg | ORAL_TABLET | Freq: Two times a day (BID) | ORAL | 0 refills | Status: DC

## 2019-05-28 NOTE — Telephone Encounter (Signed)
LM notifying pt

## 2019-05-28 NOTE — Telephone Encounter (Signed)
Okay doxycycline.  Please see your eye doctor if not better.  Thanks

## 2019-07-03 ENCOUNTER — Telehealth: Payer: Self-pay | Admitting: Internal Medicine

## 2019-07-03 NOTE — Telephone Encounter (Signed)
New message:   Pt is calling to see if there are any medications she should not take since she is getting her 1st dosage of the Covid-19 vaccination. Please advise.

## 2019-07-03 NOTE — Telephone Encounter (Signed)
LM notifying pt that she does not need to stop any of her medications

## 2019-07-06 ENCOUNTER — Ambulatory Visit: Payer: Medicare Other | Attending: Internal Medicine

## 2019-07-06 ENCOUNTER — Other Ambulatory Visit: Payer: Self-pay

## 2019-07-06 ENCOUNTER — Ambulatory Visit: Payer: Medicare Other

## 2019-07-06 DIAGNOSIS — Z23 Encounter for immunization: Secondary | ICD-10-CM | POA: Insufficient documentation

## 2019-07-06 NOTE — Progress Notes (Signed)
   Covid-19 Vaccination Clinic  Name:  Dawn Howard    MRN: [id number] DOB: [birth year]  07/06/2019  [name] was observed post Covid-19 immunization for 15 minutes without incidence. She was provided with Vaccine Information Sheet and instruction to access the V-Safe system.   [name] was instructed to call 911 with any severe reactions post vaccine: Marland Kitchen Difficulty breathing  . Swelling of your face and throat  . A fast heartbeat  . A bad rash all over your body  . Dizziness and weakness    Immunizations Administered    Name Date Dose VIS Date Route   Pfizer COVID-19 Vaccine 07/06/2019  4:25 PM 0.3 mL 04/18/2019 Intramuscular   Manufacturer: [place]   Lot: [id number]   Carlos: [id number]

## 2019-07-09 DIAGNOSIS — M5030 Other cervical disc degeneration, unspecified cervical region: Secondary | ICD-10-CM | POA: Diagnosis not present

## 2019-07-09 DIAGNOSIS — M5136 Other intervertebral disc degeneration, lumbar region: Secondary | ICD-10-CM | POA: Diagnosis not present

## 2019-07-09 DIAGNOSIS — Z5181 Encounter for therapeutic drug level monitoring: Secondary | ICD-10-CM | POA: Diagnosis not present

## 2019-07-09 DIAGNOSIS — Z79891 Long term (current) use of opiate analgesic: Secondary | ICD-10-CM | POA: Diagnosis not present

## 2019-07-09 DIAGNOSIS — Z79899 Other long term (current) drug therapy: Secondary | ICD-10-CM | POA: Diagnosis not present

## 2019-07-09 DIAGNOSIS — M542 Cervicalgia: Secondary | ICD-10-CM | POA: Diagnosis not present

## 2019-07-09 DIAGNOSIS — M545 Low back pain: Secondary | ICD-10-CM | POA: Diagnosis not present

## 2019-07-15 ENCOUNTER — Other Ambulatory Visit: Payer: Self-pay | Admitting: Internal Medicine

## 2019-07-18 DIAGNOSIS — H0102A Squamous blepharitis right eye, upper and lower eyelids: Secondary | ICD-10-CM | POA: Diagnosis not present

## 2019-07-18 DIAGNOSIS — H00025 Hordeolum internum left lower eyelid: Secondary | ICD-10-CM | POA: Diagnosis not present

## 2019-07-18 DIAGNOSIS — H0102B Squamous blepharitis left eye, upper and lower eyelids: Secondary | ICD-10-CM | POA: Diagnosis not present

## 2019-07-18 DIAGNOSIS — H00022 Hordeolum internum right lower eyelid: Secondary | ICD-10-CM | POA: Diagnosis not present

## 2019-07-22 ENCOUNTER — Encounter: Payer: Self-pay | Admitting: Internal Medicine

## 2019-07-22 ENCOUNTER — Other Ambulatory Visit: Payer: Self-pay

## 2019-07-22 ENCOUNTER — Ambulatory Visit (INDEPENDENT_AMBULATORY_CARE_PROVIDER_SITE_OTHER): Payer: Medicare Other | Admitting: Internal Medicine

## 2019-07-22 VITALS — BP 126/82 | HR 74 | Temp 98.0°F | Ht 63.0 in | Wt 182.0 lb

## 2019-07-22 DIAGNOSIS — H00012 Hordeolum externum right lower eyelid: Secondary | ICD-10-CM | POA: Diagnosis not present

## 2019-07-22 DIAGNOSIS — G8929 Other chronic pain: Secondary | ICD-10-CM | POA: Diagnosis not present

## 2019-07-22 DIAGNOSIS — M109 Gout, unspecified: Secondary | ICD-10-CM | POA: Diagnosis not present

## 2019-07-22 DIAGNOSIS — M5440 Lumbago with sciatica, unspecified side: Secondary | ICD-10-CM

## 2019-07-22 LAB — URINALYSIS
Bilirubin Urine: NEGATIVE
Hgb urine dipstick: NEGATIVE
Ketones, ur: NEGATIVE
Leukocytes,Ua: NEGATIVE
Nitrite: NEGATIVE
Specific Gravity, Urine: 1.025 (ref 1.000–1.030)
Total Protein, Urine: NEGATIVE
Urine Glucose: NEGATIVE
Urobilinogen, UA: 0.2 (ref 0.0–1.0)
pH: 5.5 (ref 5.0–8.0)

## 2019-07-22 MED ORDER — VITAMIN A 2400 MCG (8000 UT) PO TABS: 1.0000 | ORAL_TABLET | Freq: Every day | ORAL | 0 refills | Status: DC

## 2019-07-22 MED ORDER — B COMPLEX PO TABS: 1.0000 | ORAL_TABLET | Freq: Every day | ORAL | 0 refills | Status: DC

## 2019-07-22 NOTE — Progress Notes (Addendum)
Subjective:  Patient ID: Dawn Howard, female    DOB: May 02, 1951  Age: 69 y.o. MRN: [id number]  CC: No chief complaint on file.   HPI Dawn Howard presents for styes - on Doxy po now F/u on LBP, gout  Outpatient Medications Prior to Visit  Medication Sig Dispense Refill  . allopurinol (ZYLOPRIM) 100 MG tablet TAKE 1 TABLET EACH DAY. 90 tablet 3  . carvedilol (COREG) 12.5 MG tablet Take 1 tablet (12.5 mg total) by mouth 2 (two) times daily with a meal. Annual appt is overdue must see provider for future refills 60 tablet 0  . ergocalciferol (VITAMIN D2) 50000 units capsule Take 1 capsule (50,000 Units total) by mouth once a week. 6 capsule 0  . erythromycin ophthalmic ointment Place 1 application into the right eye 3 (three) times daily. 3.5 g 0  . famotidine (PEPCID) 20 MG tablet Take 1 tablet (20 mg total) by mouth 2 (two) times daily. (Patient taking differently: Take 20 mg by mouth 2 (two) times daily as needed. ) 60 tablet 5  . HYDROcodone-acetaminophen (NORCO) 7.5-325 MG tablet TK 1 T PO BID PRN 30 tablet 0  . Multiple Vitamin (MULITIVITAMIN WITH MINERALS) TABS Take 1 tablet by mouth daily.    Marland Kitchen spironolactone (ALDACTONE) 50 MG tablet TAKE 1 TABLET ONCE DAILY. 90 tablet 3  . triamcinolone ointment (KENALOG) 0.5 % Apply 1 application topically 2 (two) times daily. 30 g 0  . doxycycline (VIBRA-TABS) 100 MG tablet Take 1 tablet (100 mg total) by mouth 2 (two) times daily. 20 tablet 0  . colchicine 0.6 MG tablet Take 1 tablet (0.6 mg total) by mouth 2 (two) times daily. (Patient not taking: Reported on 07/22/2019) 60 tablet 1   No facility-administered medications prior to visit.    ROS: Review of Systems  Constitutional: Negative for activity change, appetite change, chills, fatigue and unexpected weight change.  HENT: Negative for congestion, mouth sores and sinus pressure.   Eyes: Negative for visual disturbance.  Respiratory: Negative for cough and chest tightness.     Gastrointestinal: Negative for abdominal pain and nausea.  Genitourinary: Negative for difficulty urinating, frequency and vaginal pain.  Musculoskeletal: Positive for back pain. Negative for gait problem.  Skin: Negative for pallor and rash.  Neurological: Negative for dizziness, tremors, weakness, numbness and headaches.  Psychiatric/Behavioral: Negative for confusion and sleep disturbance.    Objective:  BP 126/82 (BP Location: Left Arm, Patient Position: Sitting, Cuff Size: Normal)   Pulse 74   Temp 98 F (36.7 C) (Oral)   Ht 5\' 3"  (1.6 m)   Wt 182 lb (82.6 kg)   SpO2 96%   BMI 32.24 kg/m   BP Readings from Last 3 Encounters:  07/22/19 126/82  04/23/19 124/82  01/21/19 120/76    Wt Readings from Last 3 Encounters:  07/22/19 182 lb (82.6 kg)  04/23/19 177 lb (80.3 kg)  01/21/19 175 lb (79.4 kg)    Physical Exam Constitutional:      General: She is not in acute distress.    Appearance: She is well-developed.  HENT:     Head: Normocephalic.     Right Ear: External ear normal.     Left Ear: External ear normal.     Nose: Nose normal.  Eyes:     General:        Right eye: No discharge.        Left eye: No discharge.     Conjunctiva/sclera: Conjunctivae normal.  Pupils: Pupils are equal, round, and reactive to light.  Neck:     Thyroid: No thyromegaly.     Vascular: No JVD.     Trachea: No tracheal deviation.  Cardiovascular:     Rate and Rhythm: Normal rate and regular rhythm.     Heart sounds: Normal heart sounds.  Pulmonary:     Effort: No respiratory distress.     Breath sounds: No stridor. No wheezing.  Abdominal:     General: Bowel sounds are normal. There is no distension.     Palpations: Abdomen is soft. There is no mass.     Tenderness: There is no abdominal tenderness. There is no guarding or rebound.  Musculoskeletal:        General: No tenderness.     Cervical back: Normal range of motion and neck supple.  Lymphadenopathy:     Cervical:  No cervical adenopathy.  Skin:    Findings: No erythema or rash.  Neurological:     Cranial Nerves: No cranial nerve deficit.     Motor: No abnormal muscle tone.     Coordination: Coordination normal.     Deep Tendon Reflexes: Reflexes normal.  Psychiatric:        Behavior: Behavior normal.        Thought Content: Thought content normal.        Judgment: Judgment normal.    LS spine - pain w/ROM R eyelid stye   Lab Results  Component Value Date   WBC 7.7 07/18/2018   HGB 15.1 (H) 07/18/2018   HCT 44.5 07/18/2018   PLT 266.0 07/18/2018   GLUCOSE 92 04/23/2019   CHOL 200 07/18/2018   TRIG 185.0 (H) 07/18/2018   HDL 39.70 07/18/2018   LDLDIRECT 160.0 01/02/2018   LDLCALC 124 (H) 07/18/2018   ALT 34 01/02/2018   AST 21 01/02/2018   NA 138 04/23/2019   K 4.1 04/23/2019   CL 105 04/23/2019   CREATININE 1.05 04/23/2019   BUN 22 04/23/2019   CO2 26 04/23/2019   TSH 1.85 01/02/2018    CT CARDIAC SCORING  Addendum Date: 11/29/2018   ADDENDUM REPORT: 11/29/2018 12:49 CLINICAL DATA:  Risk stratification EXAM: Coronary Calcium Score TECHNIQUE: The patient was scanned on a [employer] scanner. Axial non-contrast 3 mm slices were carried out through the heart. The data set was analyzed on a dedicated work station and scored using the [place]. FINDINGS: Non-cardiac: See separate report from [facility]. Ascending Aorta: Normal size, trivial calcifications. Pericardium: Normal. Coronary arteries: Normal origin. IMPRESSION: 1. Coronary calcium score of 0. This was 0 percentile for age and sex matched control. 2.  Mitral annular calcifications. Electronically Signed   By: Ena Dawley   On: 11/29/2018 12:49   Result Date: 11/29/2018 EXAM: OVER-READ INTERPRETATION  CT CHEST The following report is an over-read performed by radiologist Dr. Rolm Baptise of [facility], [place] on 11/29/2018. This over-read does not include interpretation of cardiac or coronary anatomy  or pathology. The coronary calcium score interpretation by the cardiologist is attached. COMPARISON:  None. FINDINGS: Vascular: Heart is normal size.  Visualized aorta normal caliber. Mediastinum/Nodes: Numerous calcified mediastinal and hilar lymph nodes. No adenopathy. Lungs/Pleura: Numerous bilateral calcified granulomas. No confluent airspace opacity, suspicious pulmonary nodule or effusion in the visualized lungs. Upper Abdomen: Imaging into the upper abdomen shows no acute findings. Musculoskeletal: Chest wall soft tissues are unremarkable. No acute bony abnormality. IMPRESSION: Old granulomatous disease. No acute extra cardiac abnormality. Electronically Signed: By: Lennette Bihari  [place] M.D. On: 11/29/2018 11:23    Assessment & Plan:   There are no diagnoses linked to this encounter.   No orders of the defined types were placed in this encounter.    Follow-up: No follow-ups on file.  Walker Kehr, MD

## 2019-07-22 NOTE — Addendum Note (Signed)
Addended by: Cresenciano Lick on: 07/22/2019 12:00 PM   Modules accepted: Orders

## 2019-07-22 NOTE — Patient Instructions (Signed)
Vit B complex Vit A Baby shampoo, water for eyes

## 2019-08-05 ENCOUNTER — Ambulatory Visit: Payer: Medicare Other | Attending: Internal Medicine

## 2019-08-05 DIAGNOSIS — Z23 Encounter for immunization: Secondary | ICD-10-CM

## 2019-08-05 NOTE — Progress Notes (Signed)
   Covid-19 Vaccination Clinic  Name:  Dawn Howard    MRN: [id number] DOB: Nov 18, 1950  08/05/2019  [name] was observed post Covid-19 immunization for 15 minutes without incident. She was provided with Vaccine Information Sheet and instruction to access the V-Safe system.   [name] was instructed to call 911 with any severe reactions post vaccine: Marland Kitchen Difficulty breathing  . Swelling of face and throat  . A fast heartbeat  . A bad rash all over body  . Dizziness and weakness   Immunizations Administered    Name Date Dose VIS Date Route   Pfizer COVID-19 Vaccine 08/05/2019 10:47 AM 0.3 mL 04/18/2019 Intramuscular   Manufacturer: [place] [employer]   Lot: [id number]   [place]: [id number]

## 2019-08-15 DIAGNOSIS — H0102B Squamous blepharitis left eye, upper and lower eyelids: Secondary | ICD-10-CM | POA: Diagnosis not present

## 2019-08-15 DIAGNOSIS — H04123 Dry eye syndrome of bilateral lacrimal glands: Secondary | ICD-10-CM | POA: Diagnosis not present

## 2019-08-15 DIAGNOSIS — H0102A Squamous blepharitis right eye, upper and lower eyelids: Secondary | ICD-10-CM | POA: Diagnosis not present

## 2019-08-15 DIAGNOSIS — H00022 Hordeolum internum right lower eyelid: Secondary | ICD-10-CM | POA: Diagnosis not present

## 2019-08-15 DIAGNOSIS — H00025 Hordeolum internum left lower eyelid: Secondary | ICD-10-CM | POA: Diagnosis not present

## 2019-08-15 DIAGNOSIS — Z961 Presence of intraocular lens: Secondary | ICD-10-CM | POA: Diagnosis not present

## 2019-08-15 DIAGNOSIS — H26492 Other secondary cataract, left eye: Secondary | ICD-10-CM | POA: Diagnosis not present

## 2019-09-02 DIAGNOSIS — H26492 Other secondary cataract, left eye: Secondary | ICD-10-CM | POA: Diagnosis not present

## 2019-09-04 NOTE — Assessment & Plan Note (Signed)
Recurrent Doxy erythro oint

## 2019-09-04 NOTE — Assessment & Plan Note (Signed)
Allopurinol No relapse

## 2019-09-04 NOTE — Assessment & Plan Note (Signed)
Norco prn  Potential benefits of a long term opioids use as well as potential risks (i.e. addiction risk, apnea etc) and complications (i.e. Somnolence, constipation and others) were explained to the patient and were aknowledged. Dawn Howard is taking a low dose Ibuprofen prn - she understands risks in CRF

## 2019-09-17 ENCOUNTER — Other Ambulatory Visit: Payer: Self-pay | Admitting: Internal Medicine

## 2019-10-22 ENCOUNTER — Ambulatory Visit (INDEPENDENT_AMBULATORY_CARE_PROVIDER_SITE_OTHER): Payer: Medicare Other | Admitting: Internal Medicine

## 2019-10-22 ENCOUNTER — Other Ambulatory Visit: Payer: Self-pay

## 2019-10-22 ENCOUNTER — Encounter: Payer: Self-pay | Admitting: Internal Medicine

## 2019-10-22 DIAGNOSIS — I10 Essential (primary) hypertension: Secondary | ICD-10-CM

## 2019-10-22 DIAGNOSIS — E876 Hypokalemia: Secondary | ICD-10-CM | POA: Diagnosis not present

## 2019-10-22 DIAGNOSIS — R0789 Other chest pain: Secondary | ICD-10-CM

## 2019-10-22 DIAGNOSIS — E559 Vitamin D deficiency, unspecified: Secondary | ICD-10-CM | POA: Diagnosis not present

## 2019-10-22 DIAGNOSIS — N182 Chronic kidney disease, stage 2 (mild): Secondary | ICD-10-CM | POA: Diagnosis not present

## 2019-10-22 LAB — BASIC METABOLIC PANEL
BUN: 20 mg/dL (ref 6–23)
CO2: 24 mEq/L (ref 19–32)
Calcium: 10.4 mg/dL (ref 8.4–10.5)
Chloride: 104 mEq/L (ref 96–112)
Creatinine, Ser: 1.11 mg/dL (ref 0.40–1.20)
GFR: 48.42 mL/min — ABNORMAL LOW (ref >60.00)
Glucose, Bld: 85 mg/dL (ref 70–99)
Potassium: 3.9 mEq/L (ref 3.5–5.1)
Sodium: 137 mEq/L (ref 135–145)

## 2019-10-22 MED ORDER — FUROSEMIDE 20 MG PO TABS: 20.0000 mg | ORAL_TABLET | Freq: Every day | ORAL | 3 refills | Status: DC | PRN

## 2019-10-22 NOTE — Assessment & Plan Note (Signed)
On Vit D 

## 2019-10-22 NOTE — Assessment & Plan Note (Signed)
On Spironolactone

## 2019-10-22 NOTE — Progress Notes (Signed)
Subjective:  Patient ID: Dawn Howard, female    DOB: 07/13/1950  Age: 69 y.o. MRN: [id number]  CC: No chief complaint on file.   HPI Saumya Hukill presents for LBP - seeing dr Nelva Bush - dry injections F/u HTN, gout C/o wt gain    Outpatient Medications Prior to Visit  Medication Sig Dispense Refill  . allopurinol (ZYLOPRIM) 100 MG tablet TAKE 1 TABLET EACH DAY. 90 tablet 3  . b complex vitamins tablet Take 1 tablet by mouth daily. 100 tablet 0  . carvedilol (COREG) 12.5 MG tablet TAKE  (1)  TABLET TWICE A DAY WITH MEALS (BREAKFAST AND SUPPER) 180 tablet 3  . ergocalciferol (VITAMIN D2) 50000 units capsule Take 1 capsule (50,000 Units total) by mouth once a week. 6 capsule 0  . erythromycin ophthalmic ointment Place 1 application into the right eye 3 (three) times daily. 3.5 g 0  . HYDROcodone-acetaminophen (NORCO) 7.5-325 MG tablet TK 1 T PO BID PRN 30 tablet 0  . Multiple Vitamin (MULITIVITAMIN WITH MINERALS) TABS Take 1 tablet by mouth daily.    Marland Kitchen spironolactone (ALDACTONE) 50 MG tablet TAKE 1 TABLET ONCE DAILY. 90 tablet 3  . triamcinolone ointment (KENALOG) 0.5 % Apply 1 application topically 2 (two) times daily. 30 g 0  . famotidine (PEPCID) 20 MG tablet Take 1 tablet (20 mg total) by mouth 2 (two) times daily. (Patient not taking: Reported on 10/22/2019) 60 tablet 5  . Vitamin A 2400 MCG (8000 UT) TABS Take 1 tablet by mouth daily. (Patient not taking: Reported on 10/22/2019) 100 tablet 0   No facility-administered medications prior to visit.    ROS: Review of Systems  Constitutional: Negative for activity change, appetite change, chills, fatigue and unexpected weight change.  HENT: Negative for congestion, mouth sores and sinus pressure.   Eyes: Negative for visual disturbance.  Respiratory: Negative for cough and chest tightness.   Gastrointestinal: Negative for abdominal pain and nausea.  Genitourinary: Negative for difficulty urinating, frequency and vaginal pain.   Musculoskeletal: Positive for back pain. Negative for gait problem.  Skin: Negative for pallor and rash.  Neurological: Negative for dizziness, tremors, weakness, numbness and headaches.  Psychiatric/Behavioral: Negative for confusion and sleep disturbance. The patient is not nervous/anxious.     Objective:  BP (!) 138/96 (BP Location: Right Arm, Patient Position: Sitting, Cuff Size: Normal)   Pulse 76   Temp 98.2 F (36.8 C) (Oral)   Ht 5\' 3"  (1.6 m)   Wt 188 lb (85.3 kg)   SpO2 97%   BMI 33.30 kg/m   BP Readings from Last 3 Encounters:  10/22/19 (!) 138/96  07/22/19 126/82  04/23/19 124/82    Wt Readings from Last 3 Encounters:  10/22/19 188 lb (85.3 kg)  07/22/19 182 lb (82.6 kg)  04/23/19 177 lb (80.3 kg)    Physical Exam Constitutional:      General: She is not in acute distress.    Appearance: She is well-developed. She is obese.  HENT:     Head: Normocephalic.     Right Ear: External ear normal.     Left Ear: External ear normal.     Nose: Nose normal.  Eyes:     General:        Right eye: No discharge.        Left eye: No discharge.     Conjunctiva/sclera: Conjunctivae normal.     Pupils: Pupils are equal, round, and reactive to light.  Neck:  Thyroid: No thyromegaly.     Vascular: No JVD.     Trachea: No tracheal deviation.  Cardiovascular:     Rate and Rhythm: Normal rate and regular rhythm.     Heart sounds: Normal heart sounds.  Pulmonary:     Effort: No respiratory distress.     Breath sounds: No stridor. No wheezing.  Abdominal:     General: Bowel sounds are normal. There is no distension.     Palpations: Abdomen is soft. There is no mass.     Tenderness: There is no abdominal tenderness. There is no guarding or rebound.  Musculoskeletal:        General: Tenderness present.     Cervical back: Normal range of motion and neck supple.  Lymphadenopathy:     Cervical: No cervical adenopathy.  Skin:    Findings: No erythema or rash.    Neurological:     Mental Status: She is oriented to person, place, and time.     Cranial Nerves: No cranial nerve deficit.     Motor: No abnormal muscle tone.     Coordination: Coordination normal.     Deep Tendon Reflexes: Reflexes normal.  Psychiatric:        Behavior: Behavior normal.        Thought Content: Thought content normal.        Judgment: Judgment normal.   LS tender w/ROM B trace edema  Lab Results  Component Value Date   WBC 7.7 07/18/2018   HGB 15.1 (H) 07/18/2018   HCT 44.5 07/18/2018   PLT 266.0 07/18/2018   GLUCOSE 92 04/23/2019   CHOL 200 07/18/2018   TRIG 185.0 (H) 07/18/2018   HDL 39.70 07/18/2018   LDLDIRECT 160.0 01/02/2018   LDLCALC 124 (H) 07/18/2018   ALT 34 01/02/2018   AST 21 01/02/2018   NA 138 04/23/2019   K 4.1 04/23/2019   CL 105 04/23/2019   CREATININE 1.05 04/23/2019   BUN 22 04/23/2019   CO2 26 04/23/2019   TSH 1.85 01/02/2018    CT CARDIAC SCORING  Addendum Date: 11/29/2018   ADDENDUM REPORT: 11/29/2018 12:49 CLINICAL DATA:  Risk stratification EXAM: Coronary Calcium Score TECHNIQUE: The patient was scanned on a [employer] scanner. Axial non-contrast 3 mm slices were carried out through the heart. The data set was analyzed on a dedicated work station and scored using the [place]. FINDINGS: Non-cardiac: See separate report from [facility]. Ascending Aorta: Normal size, trivial calcifications. Pericardium: Normal. Coronary arteries: Normal origin. IMPRESSION: 1. Coronary calcium score of 0. This was 0 percentile for age and sex matched control. 2.  Mitral annular calcifications. Electronically Signed   By: Ena Dawley   On: 11/29/2018 12:49   Result Date: 11/29/2018 EXAM: OVER-READ INTERPRETATION  CT CHEST The following report is an over-read performed by radiologist Dr. Rolm Baptise of [facility], [place] on 11/29/2018. This over-read does not include interpretation of cardiac or coronary anatomy or pathology.  The coronary calcium score interpretation by the cardiologist is attached. COMPARISON:  None. FINDINGS: Vascular: Heart is normal size.  Visualized aorta normal caliber. Mediastinum/Nodes: Numerous calcified mediastinal and hilar lymph nodes. No adenopathy. Lungs/Pleura: Numerous bilateral calcified granulomas. No confluent airspace opacity, suspicious pulmonary nodule or effusion in the visualized lungs. Upper Abdomen: Imaging into the upper abdomen shows no acute findings. Musculoskeletal: Chest wall soft tissues are unremarkable. No acute bony abnormality. IMPRESSION: Old granulomatous disease. No acute extra cardiac abnormality. Electronically Signed: By: Rolm Baptise M.D. On:  11/29/2018 11:23    Assessment & Plan:    Walker Kehr, MD

## 2019-10-22 NOTE — Patient Instructions (Addendum)
Liraglutide injection (Weight Management) What is this medicine? LIRAGLUTIDE (LIR a GLOO tide) is used to help people lose weight and maintain weight loss. It is used with a reduced-calorie diet and exercise. This medicine may be used for other purposes; ask your health care provider or pharmacist if you have questions. COMMON BRAND NAME(S): Saxenda What should I tell my health care provider before I take this medicine? They need to know if you have any of these conditions:  endocrine tumors (MEN 2) or if someone in your family had these tumors  gallbladder disease  high cholesterol  history of alcohol abuse problem  history of pancreatitis  kidney disease or if you are on dialysis  liver disease  previous swelling of the tongue, face, or lips with difficulty breathing, difficulty swallowing, hoarseness, or tightening of the throat  stomach problems  suicidal thoughts, plans, or attempt; a previous suicide attempt by you or a family member  thyroid cancer or if someone in your family had thyroid cancer  an unusual or allergic reaction to liraglutide, other medicines, foods, dyes, or preservatives  pregnant or trying to get pregnant  breast-feeding How should I use this medicine? This medicine is for injection under the skin of your upper leg, stomach area, or upper arm. You will be taught how to prepare and give this medicine. Use exactly as directed. Take your medicine at regular intervals. Do not take it more often than directed. This drug comes with INSTRUCTIONS FOR USE. Ask your pharmacist for directions on how to use this drug. Read the information carefully. Talk to your pharmacist or health care provider if you have questions. It is important that you put your used needles and syringes in a special sharps container. Do not put them in a trash can. If you do not have a sharps container, call your pharmacist or healthcare provider to get one. A special MedGuide will be  given to you by the pharmacist with each prescription and refill. Be sure to read this information carefully each time. Talk to your pediatrician regarding the use of this medicine in children. Special care may be needed. Overdosage: If you think you have taken too much of this medicine contact a poison control center or emergency room at once. NOTE: This medicine is only for you. Do not share this medicine with others. What if I miss a dose? If you miss a dose, take it as soon as you can. If it is almost time for your next dose, take only that dose. Do not take double or extra doses. If you miss your dose for 3 days or more, call your doctor or health care professional to talk about how to restart this medicine. What may interact with this medicine?  insulin and other medicines for diabetes This list may not describe all possible interactions. Give your health care provider a list of all the medicines, herbs, non-prescription drugs, or dietary supplements you use. Also tell them if you smoke, drink alcohol, or use illegal drugs. Some items may interact with your medicine. What should I watch for while using this medicine? Visit your doctor or health care professional for regular checks on your progress. Drink plenty of fluids while taking this medicine. Check with your doctor or health care professional if you get an attack of severe diarrhea, nausea, and vomiting. The loss of too much body fluid can make it dangerous for you to take this medicine. This medicine may affect blood sugar levels. Ask your healthcare  provider if changes in diet or medicines are needed if you have diabetes. Patients and their families should watch out for worsening depression or thoughts of suicide. Also watch out for sudden changes in feelings such as feeling anxious, agitated, panicky, irritable, hostile, aggressive, impulsive, severely restless, overly excited and hyperactive, or not being able to sleep. If this happens,  especially at the beginning of treatment or after a change in dose, call your health care professional. Women should inform their health care provider if they wish to become pregnant or think they might be pregnant. Losing weight while pregnant is not advised and may cause harm to the unborn child. Talk to your health care provider for more information. What side effects may I notice from receiving this medicine? Side effects that you should report to your doctor or health care professional as soon as possible:  allergic reactions like skin rash, itching or hives, swelling of the face, lips, or tongue  breathing problems  diarrhea that continues or is severe  lump or swelling on the neck  severe nausea  signs and symptoms of infection like fever or chills; cough; sore throat; pain or trouble passing urine  signs and symptoms of low blood sugar such as feeling anxious; confusion; dizziness; increased hunger; unusually weak or tired; increased sweating; shakiness; cold, clammy skin; irritable; headache; blurred vision; fast heartbeat; loss of consciousness  signs and symptoms of kidney injury like trouble passing urine or change in the amount of urine  trouble swallowing  unusual stomach upset or pain  vomiting Side effects that usually do not require medical attention (report to your doctor or health care professional if they continue or are bothersome):  constipation  decreased appetite  diarrhea  fatigue  headache  nausea  pain, redness, or irritation at site where injected  stomach upset  stuffy or runny nose This list may not describe all possible side effects. Call your doctor for medical advice about side effects. You may report side effects to FDA at 1-800-FDA-1088. Where should I keep my medicine? Keep out of the reach of children. Store unopened pen in a refrigerator between 2 and 8 degrees C (36 and 46 degrees F). Do not freeze or use if the medicine has been  frozen. Protect from light and excessive heat. After you first use the pen, it can be stored at room temperature between 15 and 30 degrees C (59 and 86 degrees F) or in a refrigerator. Throw away your used pen after 30 days or after the expiration date, whichever comes first. Do not store your pen with the needle attached. If the needle is left on, medicine may leak from the pen. NOTE: This sheet is a summary. It may not cover all possible information. If you have questions about this medicine, talk to your doctor, pharmacist, or health care provider.  2020 Elsevier/Gold Standard (2019-02-27 21:16:59)    These suggestions will probably help you to improve your metabolism if you are not overweight and to lose weight if you are overweight: 1.  Reduce your consumption of sugars and starches.  Eliminate high fructose corn syrup from your diet.  Reduce your consumption of processed foods.  For desserts try to have seasonal fruits, berries, nuts, cheeses or dark chocolate with more than 70% cacao. 2.  Do not snack 3.  You do not have to eat breakfast.  If you choose to have breakfast-eat plain greek yogurt, eggs, oatmeal (without sugar) 4.  Drink water, freshly brewed unsweetened tea (  green, black or herbal) or coffee.  Do not drink sodas including diet sodas , juices, beverages sweetened with artificial sweeteners. 5.  Reduce your consumption of refined grains. 6.  Avoid protein drinks such as Optifast, Slim fast etc. Eat chicken, fish, meat, dairy and beans for your sources of protein 7.  Natural unprocessed fats like cold pressed virgin olive oil, butter, coconut oil are good for you.  Eat avocados 8.  Increase your consumption of fiber.  Fruits, berries, vegetables, whole grains, flaxseeds, Chia seeds, beans, popcorn, nuts, oatmeal are good sources of fiber 9.  Use vinegar in your diet, i.e. apple cider vinegar, red wine or balsamic vinegar 10.  You can try fasting.  For example you can skip  breakfast and lunch every other day (24-hour fast) 11.  Stress reduction, good night sleep, relaxation, meditation, yoga and other physical activity is likely to help you to maintain low weight too. 12.  If you drink alcohol, limit your alcohol intake to no more than 2 drinks a day.

## 2019-10-22 NOTE — Assessment & Plan Note (Signed)
Labs

## 2019-11-05 ENCOUNTER — Other Ambulatory Visit: Payer: Self-pay | Admitting: Internal Medicine

## 2019-11-28 ENCOUNTER — Ambulatory Visit: Payer: Self-pay

## 2019-11-28 NOTE — Telephone Encounter (Signed)
Returned call to patient. She states that she has a cough and headache and has scheduled herself for COVID-19 test Monday.  She states her grandchildren usually come and go through her house They live close. She was wondering if she should allow them in before she knows the outcome of her test. Patient was advised that it would be recommended to keep socially distanced from others until she receives her result. Practice good hand hygiene and wear mask if she must be in close contact with others.  She verbalized understanding of all information.  Reason for Disposition . General information question, no triage required and triager able to answer question  Answer Assessment - Initial Assessment Questions 1. REASON FOR CALL or QUESTION: "What is your reason for calling today?" or "How can I best help you?" or "What question do you have that I can help answer?"     I have cough headache had have scheduled for COVID test on Monday.  Should I have my grandchildren in my home this weekend.  Protocols used: INFORMATION ONLY CALL - NO TRIAGE-A-AH

## 2019-12-01 ENCOUNTER — Ambulatory Visit: Payer: Medicare Other | Attending: Internal Medicine

## 2019-12-01 DIAGNOSIS — Z20822 Contact with and (suspected) exposure to covid-19: Secondary | ICD-10-CM | POA: Diagnosis not present

## 2019-12-02 LAB — NOVEL CORONAVIRUS, NAA: SARS-CoV-2, NAA: NOT DETECTED

## 2019-12-02 LAB — SARS-COV-2, NAA 2 DAY TAT

## 2019-12-11 ENCOUNTER — Other Ambulatory Visit (INDEPENDENT_AMBULATORY_CARE_PROVIDER_SITE_OTHER): Payer: Medicare Other

## 2019-12-11 ENCOUNTER — Encounter: Payer: Self-pay | Admitting: Internal Medicine

## 2019-12-11 ENCOUNTER — Ambulatory Visit (INDEPENDENT_AMBULATORY_CARE_PROVIDER_SITE_OTHER): Payer: Medicare Other | Admitting: Internal Medicine

## 2019-12-11 ENCOUNTER — Other Ambulatory Visit: Payer: Self-pay

## 2019-12-11 VITALS — BP 110/76 | HR 93 | Temp 98.3°F | Ht 63.0 in | Wt 182.0 lb

## 2019-12-11 DIAGNOSIS — R1012 Left upper quadrant pain: Secondary | ICD-10-CM

## 2019-12-11 DIAGNOSIS — R002 Palpitations: Secondary | ICD-10-CM | POA: Diagnosis not present

## 2019-12-11 DIAGNOSIS — N182 Chronic kidney disease, stage 2 (mild): Secondary | ICD-10-CM

## 2019-12-11 DIAGNOSIS — R011 Cardiac murmur, unspecified: Secondary | ICD-10-CM | POA: Diagnosis not present

## 2019-12-11 LAB — CBC WITH DIFFERENTIAL/PLATELET
Basophils Absolute: 0.1 10*3/uL (ref 0.0–0.1)
Basophils Relative: 1.1 % (ref 0.0–3.0)
Eosinophils Absolute: 0.2 10*3/uL (ref 0.0–0.7)
Eosinophils Relative: 2.2 % (ref 0.0–5.0)
HCT: 47 % — ABNORMAL HIGH (ref 36.0–46.0)
Hemoglobin: 16 g/dL — ABNORMAL HIGH (ref 12.0–15.0)
Lymphocytes Relative: 22.9 % (ref 12.0–46.0)
Lymphs Abs: 2.1 10*3/uL (ref 0.7–4.0)
MCHC: 34.1 g/dL (ref 30.0–36.0)
MCV: 89.9 fl (ref 78.0–100.0)
Monocytes Absolute: 0.7 10*3/uL (ref 0.1–1.0)
Monocytes Relative: 7.8 % (ref 3.0–12.0)
Neutro Abs: 6.1 10*3/uL (ref 1.4–7.7)
Neutrophils Relative %: 66 % (ref 43.0–77.0)
Platelets: 280 10*3/uL (ref 150.0–400.0)
RBC: 5.22 Mil/uL — ABNORMAL HIGH (ref 3.87–5.11)
RDW: 13.6 % (ref 11.5–15.5)
WBC: 9.2 10*3/uL (ref 4.0–10.5)

## 2019-12-11 LAB — COMPREHENSIVE METABOLIC PANEL
ALT: 36 U/L — ABNORMAL HIGH (ref 0–35)
AST: 25 U/L (ref 0–37)
Albumin: 4.5 g/dL (ref 3.5–5.2)
Alkaline Phosphatase: 106 U/L (ref 39–117)
BUN: 25 mg/dL — ABNORMAL HIGH (ref 6–23)
CO2: 26 mEq/L (ref 19–32)
Calcium: 10.4 mg/dL (ref 8.4–10.5)
Chloride: 102 mEq/L (ref 96–112)
Creatinine, Ser: 1.01 mg/dL (ref 0.40–1.20)
GFR: 53.97 mL/min — ABNORMAL LOW (ref >60.00)
Glucose, Bld: 93 mg/dL (ref 70–99)
Potassium: 3.7 mEq/L (ref 3.5–5.1)
Sodium: 136 mEq/L (ref 135–145)
Total Bilirubin: 0.7 mg/dL (ref 0.2–1.2)
Total Protein: 7.8 g/dL (ref 6.0–8.3)

## 2019-12-11 LAB — URINALYSIS
Bilirubin Urine: NEGATIVE
Ketones, ur: NEGATIVE
Leukocytes,Ua: NEGATIVE
Nitrite: NEGATIVE
Specific Gravity, Urine: 1.025 (ref 1.000–1.030)
Total Protein, Urine: NEGATIVE
Urine Glucose: NEGATIVE
Urobilinogen, UA: 0.2 (ref 0.0–1.0)
pH: 5.5 (ref 5.0–8.0)

## 2019-12-11 LAB — TSH: TSH: 1.58 u[IU]/mL (ref 0.35–4.50)

## 2019-12-11 LAB — T4, FREE: Free T4: 1.01 ng/dL (ref 0.60–1.60)

## 2019-12-11 NOTE — Addendum Note (Signed)
Addended by: Trenda Moots on: 01/14/11 11:28 AM   Modules accepted: Orders

## 2019-12-11 NOTE — Assessment & Plan Note (Signed)
CMET 

## 2019-12-11 NOTE — Assessment & Plan Note (Signed)
EKG Labs ECHO

## 2019-12-11 NOTE — Addendum Note (Signed)
Addended by: Darlys Gales on: 12/11/2019 02:55 PM   Modules accepted: Orders

## 2019-12-11 NOTE — Progress Notes (Signed)
Subjective:  Patient ID: Dawn Howard, female    DOB: 10/19/1950  Age: 69 y.o. MRN: [id number]  CC: No chief complaint on file.   HPI Dawn Howard presents for twinges of pain below ribs on the left off and on  C/o one episode of heart racing in July x 1-2 min    Outpatient Medications Prior to Visit  Medication Sig Dispense Refill  . allopurinol (ZYLOPRIM) 100 MG tablet TAKE 1 TABLET EACH DAY. 90 tablet 3  . b complex vitamins tablet Take 1 tablet by mouth daily. 100 tablet 0  . carvedilol (COREG) 12.5 MG tablet TAKE  (1)  TABLET TWICE A DAY WITH MEALS (BREAKFAST AND SUPPER) 180 tablet 3  . ergocalciferol (VITAMIN D2) 50000 units capsule Take 1 capsule (50,000 Units total) by mouth once a week. 6 capsule 0  . erythromycin ophthalmic ointment Place 1 application into the right eye 3 (three) times daily. 3.5 g 0  . famotidine (PEPCID) 20 MG tablet Take 1 tablet (20 mg total) by mouth 2 (two) times daily. 60 tablet 5  . furosemide (LASIX) 20 MG tablet Take 1-2 tablets (20-40 mg total) by mouth daily as needed for edema. 30 tablet 3  . HYDROcodone-acetaminophen (NORCO) 7.5-325 MG tablet TK 1 T PO BID PRN 30 tablet 0  . Multiple Vitamin (MULITIVITAMIN WITH MINERALS) TABS Take 1 tablet by mouth daily.    Marland Kitchen spironolactone (ALDACTONE) 50 MG tablet TAKE 1 TABLET ONCE DAILY. 90 tablet 0  . triamcinolone ointment (KENALOG) 0.5 % Apply 1 application topically 2 (two) times daily. 30 g 0  . Vitamin A 2400 MCG (8000 UT) TABS Take 1 tablet by mouth daily. 100 tablet 0   No facility-administered medications prior to visit.    ROS: Review of Systems  Constitutional: Negative for activity change, appetite change, chills, fatigue and unexpected weight change.  HENT: Negative for congestion, mouth sores and sinus pressure.   Eyes: Negative for visual disturbance.  Respiratory: Negative for cough and chest tightness.   Cardiovascular: Positive for palpitations.  Gastrointestinal: Positive  for abdominal pain. Negative for nausea.  Genitourinary: Negative for difficulty urinating, frequency and vaginal pain.  Musculoskeletal: Negative for back pain and gait problem.  Skin: Negative for pallor and rash.  Neurological: Negative for dizziness, tremors, weakness, numbness and headaches.  Psychiatric/Behavioral: Negative for confusion and sleep disturbance.    Objective:  BP 110/76 (BP Location: Right Arm, Patient Position: Sitting, Cuff Size: Large)   Pulse 93   Temp 98.3 F (36.8 C) (Oral)   Ht 5\' 3"  (1.6 m)   Wt 182 lb (82.6 kg)   SpO2 96%   BMI 32.24 kg/m   BP Readings from Last 3 Encounters:  12/11/19 110/76  10/22/19 (!) 138/96  07/22/19 126/82    Wt Readings from Last 3 Encounters:  12/11/19 182 lb (82.6 kg)  10/22/19 188 lb (85.3 kg)  07/22/19 182 lb (82.6 kg)    Physical Exam Constitutional:      General: She is not in acute distress.    Appearance: She is well-developed.  HENT:     Head: Normocephalic.     Right Ear: External ear normal.     Left Ear: External ear normal.     Nose: Nose normal.  Eyes:     General:        Right eye: No discharge.        Left eye: No discharge.     Conjunctiva/sclera: Conjunctivae normal.  Pupils: Pupils are equal, round, and reactive to light.  Neck:     Thyroid: No thyromegaly.     Vascular: No JVD.     Trachea: No tracheal deviation.  Cardiovascular:     Rate and Rhythm: Normal rate and regular rhythm.     Heart sounds: Normal heart sounds.  Pulmonary:     Effort: No respiratory distress.     Breath sounds: No stridor. No wheezing.  Abdominal:     General: Bowel sounds are normal. There is no distension.     Palpations: Abdomen is soft. There is no mass.     Tenderness: There is no abdominal tenderness. There is no guarding or rebound.  Musculoskeletal:        General: No tenderness.     Cervical back: Normal range of motion and neck supple.  Lymphadenopathy:     Cervical: No cervical adenopathy.    Skin:    Findings: No erythema or rash.  Neurological:     Cranial Nerves: No cranial nerve deficit.     Motor: No abnormal muscle tone.     Coordination: Coordination normal.     Deep Tendon Reflexes: Reflexes normal.  Psychiatric:        Behavior: Behavior normal.        Thought Content: Thought content normal.        Judgment: Judgment normal.      Procedure: EKG Indication: palpitations Impression: NSR. No acute changes.  Lab Results  Component Value Date   WBC 7.7 07/18/2018   HGB 15.1 (H) 07/18/2018   HCT 44.5 07/18/2018   PLT 266.0 07/18/2018   GLUCOSE 85 10/22/2019   CHOL 200 07/18/2018   TRIG 185.0 (H) 07/18/2018   HDL 39.70 07/18/2018   LDLDIRECT 160.0 01/02/2018   LDLCALC 124 (H) 07/18/2018   ALT 34 01/02/2018   AST 21 01/02/2018   NA 137 10/22/2019   K 3.9 10/22/2019   CL 104 10/22/2019   CREATININE 1.11 10/22/2019   BUN 20 10/22/2019   CO2 24 10/22/2019   TSH 1.85 01/02/2018    CT CARDIAC SCORING  Addendum Date: 11/29/2018   ADDENDUM REPORT: 11/29/2018 12:49 CLINICAL DATA:  Risk stratification EXAM: Coronary Calcium Score TECHNIQUE: The patient was scanned on a [employer] scanner. Axial non-contrast 3 mm slices were carried out through the heart. The data set was analyzed on a dedicated work station and scored using the [place]. FINDINGS: Non-cardiac: See separate report from [facility]. Ascending Aorta: Normal size, trivial calcifications. Pericardium: Normal. Coronary arteries: Normal origin. IMPRESSION: 1. Coronary calcium score of 0. This was 0 percentile for age and sex matched control. 2.  Mitral annular calcifications. Electronically Signed   By: Ena Dawley   On: 11/29/2018 12:49   Result Date: 11/29/2018 EXAM: OVER-READ INTERPRETATION  CT CHEST The following report is an over-read performed by radiologist Dr. Rolm Baptise of [facility], [place] on 11/29/2018. This over-read does not include interpretation of cardiac or  coronary anatomy or pathology. The coronary calcium score interpretation by the cardiologist is attached. COMPARISON:  None. FINDINGS: Vascular: Heart is normal size.  Visualized aorta normal caliber. Mediastinum/Nodes: Numerous calcified mediastinal and hilar lymph nodes. No adenopathy. Lungs/Pleura: Numerous bilateral calcified granulomas. No confluent airspace opacity, suspicious pulmonary nodule or effusion in the visualized lungs. Upper Abdomen: Imaging into the upper abdomen shows no acute findings. Musculoskeletal: Chest wall soft tissues are unremarkable. No acute bony abnormality. IMPRESSION: Old granulomatous disease. No acute extra cardiac abnormality. Electronically  Signed: By: Rolm Baptise M.D. On: 11/29/2018 11:23    Assessment & Plan:    Follow-up: No follow-ups on file.  Walker Kehr, MD

## 2019-12-11 NOTE — Assessment & Plan Note (Signed)
Abd [place] Labs EKG

## 2019-12-16 DIAGNOSIS — H0102B Squamous blepharitis left eye, upper and lower eyelids: Secondary | ICD-10-CM | POA: Diagnosis not present

## 2019-12-16 DIAGNOSIS — H17821 Peripheral opacity of cornea, right eye: Secondary | ICD-10-CM | POA: Diagnosis not present

## 2019-12-16 DIAGNOSIS — H0102A Squamous blepharitis right eye, upper and lower eyelids: Secondary | ICD-10-CM | POA: Diagnosis not present

## 2019-12-16 DIAGNOSIS — H26491 Other secondary cataract, right eye: Secondary | ICD-10-CM | POA: Diagnosis not present

## 2019-12-16 DIAGNOSIS — H04123 Dry eye syndrome of bilateral lacrimal glands: Secondary | ICD-10-CM | POA: Diagnosis not present

## 2019-12-16 DIAGNOSIS — H43813 Vitreous degeneration, bilateral: Secondary | ICD-10-CM | POA: Diagnosis not present

## 2019-12-16 DIAGNOSIS — Z961 Presence of intraocular lens: Secondary | ICD-10-CM | POA: Diagnosis not present

## 2019-12-17 DIAGNOSIS — G894 Chronic pain syndrome: Secondary | ICD-10-CM | POA: Diagnosis not present

## 2019-12-17 DIAGNOSIS — Z79891 Long term (current) use of opiate analgesic: Secondary | ICD-10-CM | POA: Diagnosis not present

## 2019-12-17 DIAGNOSIS — M545 Low back pain: Secondary | ICD-10-CM | POA: Diagnosis not present

## 2019-12-17 DIAGNOSIS — M5136 Other intervertebral disc degeneration, lumbar region: Secondary | ICD-10-CM | POA: Diagnosis not present

## 2019-12-17 DIAGNOSIS — M542 Cervicalgia: Secondary | ICD-10-CM | POA: Diagnosis not present

## 2019-12-17 DIAGNOSIS — M5030 Other cervical disc degeneration, unspecified cervical region: Secondary | ICD-10-CM | POA: Diagnosis not present

## 2019-12-23 ENCOUNTER — Other Ambulatory Visit: Payer: Medicare Other

## 2019-12-24 ENCOUNTER — Ambulatory Visit
Admission: RE | Admit: 2019-12-24 | Discharge: 2019-12-24 | Disposition: A | Discharge status: Home / Self Care | Payer: Medicare Other | Source: Ambulatory Visit | Attending: Internal Medicine | Admitting: Internal Medicine

## 2019-12-24 DIAGNOSIS — N281 Cyst of kidney, acquired: Secondary | ICD-10-CM | POA: Diagnosis not present

## 2019-12-24 DIAGNOSIS — K7689 Other specified diseases of liver: Secondary | ICD-10-CM | POA: Diagnosis not present

## 2019-12-26 ENCOUNTER — Ambulatory Visit (HOSPITAL_COMMUNITY): Payer: Medicare Other | Attending: Cardiology

## 2019-12-26 ENCOUNTER — Other Ambulatory Visit: Payer: Self-pay

## 2019-12-26 DIAGNOSIS — R011 Cardiac murmur, unspecified: Secondary | ICD-10-CM | POA: Insufficient documentation

## 2019-12-26 DIAGNOSIS — R002 Palpitations: Secondary | ICD-10-CM | POA: Diagnosis not present

## 2019-12-26 LAB — ECHOCARDIOGRAM COMPLETE
Area-P 1/2: 2.91 cm2
S' Lateral: 3.4 cm

## 2020-01-21 ENCOUNTER — Encounter: Payer: Self-pay | Admitting: Internal Medicine

## 2020-01-21 ENCOUNTER — Other Ambulatory Visit: Payer: Self-pay

## 2020-01-21 ENCOUNTER — Ambulatory Visit (INDEPENDENT_AMBULATORY_CARE_PROVIDER_SITE_OTHER): Payer: Medicare Other | Admitting: Internal Medicine

## 2020-01-21 VITALS — BP 116/70 | HR 75 | Temp 98.3°F | Ht 63.0 in | Wt 173.0 lb

## 2020-01-21 DIAGNOSIS — R1012 Left upper quadrant pain: Secondary | ICD-10-CM

## 2020-01-21 DIAGNOSIS — K760 Fatty (change of) liver, not elsewhere classified: Secondary | ICD-10-CM

## 2020-01-21 DIAGNOSIS — N182 Chronic kidney disease, stage 2 (mild): Secondary | ICD-10-CM | POA: Diagnosis not present

## 2020-01-21 DIAGNOSIS — E559 Vitamin D deficiency, unspecified: Secondary | ICD-10-CM | POA: Diagnosis not present

## 2020-01-21 DIAGNOSIS — Z23 Encounter for immunization: Secondary | ICD-10-CM | POA: Diagnosis not present

## 2020-01-21 DIAGNOSIS — I10 Essential (primary) hypertension: Secondary | ICD-10-CM

## 2020-01-21 DIAGNOSIS — D751 Secondary polycythemia: Secondary | ICD-10-CM

## 2020-01-21 DIAGNOSIS — M109 Gout, unspecified: Secondary | ICD-10-CM | POA: Diagnosis not present

## 2020-01-21 LAB — CBC WITH DIFFERENTIAL/PLATELET
Absolute Monocytes: 584 cells/uL (ref 200–950)
Basophils Absolute: 80 cells/uL (ref 0–200)
Basophils Relative: 1 %
Eosinophils Absolute: 168 cells/uL (ref 15–500)
Eosinophils Relative: 2.1 %
HCT: 47.8 % — ABNORMAL HIGH (ref 35.0–45.0)
Hemoglobin: 16 g/dL — ABNORMAL HIGH (ref 11.7–15.5)
Lymphs Abs: 2032 cells/uL (ref 850–3900)
MCH: 30.4 pg (ref 27.0–33.0)
MCHC: 33.5 g/dL (ref 32.0–36.0)
MCV: 90.7 fL (ref 80.0–100.0)
MPV: 10 fL (ref 7.5–12.5)
Monocytes Relative: 7.3 %
Neutro Abs: 5136 cells/uL (ref 1500–7800)
Neutrophils Relative %: 64.2 %
Platelets: 287 10*3/uL (ref 140–400)
RBC: 5.27 10*6/uL — ABNORMAL HIGH (ref 3.80–5.10)
RDW: 12.8 % (ref 11.0–15.0)
Total Lymphocyte: 25.4 %
WBC: 8 10*3/uL (ref 3.8–10.8)

## 2020-01-21 NOTE — Assessment & Plan Note (Signed)
CBC

## 2020-01-21 NOTE — Progress Notes (Signed)
Subjective:  Patient ID: Dawn Howard, female    DOB: 1950/06/30  Age: 69 y.o. MRN: [id number]  CC: No chief complaint on file.   HPI Dawn Howard presents for HTN, GERD, gout f/u F/u LUQ pain - recurrent off and on since June F/u fatty liver Pt had cologuard - ?results [place] was ok   Outpatient Medications Prior to Visit  Medication Sig Dispense Refill   allopurinol (ZYLOPRIM) 100 MG tablet TAKE 1 TABLET EACH DAY. 90 tablet 3   b complex vitamins tablet Take 1 tablet by mouth daily. 100 tablet 0   carvedilol (COREG) 12.5 MG tablet TAKE  (1)  TABLET TWICE A DAY WITH MEALS (BREAKFAST AND SUPPER) 180 tablet 3   ergocalciferol (VITAMIN D2) 50000 units capsule Take 1 capsule (50,000 Units total) by mouth once a week. 6 capsule 0   erythromycin ophthalmic ointment Place 1 application into the right eye 3 (three) times daily. 3.5 g 0   famotidine (PEPCID) 20 MG tablet Take 1 tablet (20 mg total) by mouth 2 (two) times daily. 60 tablet 5   furosemide (LASIX) 20 MG tablet Take 1-2 tablets (20-40 mg total) by mouth daily as needed for edema. 30 tablet 3   HYDROcodone-acetaminophen (NORCO) 7.5-325 MG tablet TK 1 T PO BID PRN 30 tablet 0   Multiple Vitamin (MULITIVITAMIN WITH MINERALS) TABS Take 1 tablet by mouth daily.     spironolactone (ALDACTONE) 50 MG tablet TAKE 1 TABLET ONCE DAILY. 90 tablet 0   triamcinolone ointment (KENALOG) 0.5 % Apply 1 application topically 2 (two) times daily. 30 g 0   Vitamin A 2400 MCG (8000 UT) TABS Take 1 tablet by mouth daily. 100 tablet 0   No facility-administered medications prior to visit.    ROS: Review of Systems  Constitutional: Negative for activity change, appetite change, chills, fatigue and unexpected weight change.  HENT: Negative for congestion, mouth sores and sinus pressure.   Eyes: Negative for visual disturbance.  Respiratory: Negative for cough and chest tightness.   Gastrointestinal: Negative for abdominal pain and  nausea.  Genitourinary: Negative for difficulty urinating, frequency and vaginal pain.  Musculoskeletal: Positive for arthralgias. Negative for back pain and gait problem.  Skin: Negative for pallor and rash.  Neurological: Negative for dizziness, tremors, weakness, numbness and headaches.  Psychiatric/Behavioral: Negative for confusion and sleep disturbance.    Objective:  BP 116/70 (BP Location: Left Arm, Patient Position: Sitting, Cuff Size: Large)    Pulse 75    Temp 98.3 F (36.8 C) (Oral)    Ht 5\' 3"  (1.6 m)    Wt 173 lb (78.5 kg)    SpO2 96%    BMI 30.65 kg/m   BP Readings from Last 3 Encounters:  01/21/20 116/70  12/11/19 110/76  10/22/19 (!) 138/96    Wt Readings from Last 3 Encounters:  01/21/20 173 lb (78.5 kg)  12/11/19 182 lb (82.6 kg)  10/22/19 188 lb (85.3 kg)    Physical Exam Constitutional:      General: She is not in acute distress.    Appearance: She is well-developed.  HENT:     Head: Normocephalic.     Right Ear: External ear normal.     Left Ear: External ear normal.     Nose: Nose normal.  Eyes:     General:        Right eye: No discharge.        Left eye: No discharge.     Conjunctiva/sclera:  Conjunctivae normal.     Pupils: Pupils are equal, round, and reactive to light.  Neck:     Thyroid: No thyromegaly.     Vascular: No JVD.     Trachea: No tracheal deviation.  Cardiovascular:     Rate and Rhythm: Normal rate and regular rhythm.     Heart sounds: Normal heart sounds.  Pulmonary:     Effort: No respiratory distress.     Breath sounds: No stridor. No wheezing.  Abdominal:     General: Bowel sounds are normal. There is no distension.     Palpations: Abdomen is soft. There is no mass.     Tenderness: There is no abdominal tenderness. There is no guarding or rebound.  Musculoskeletal:        General: No tenderness.     Cervical back: Normal range of motion and neck supple.  Lymphadenopathy:     Cervical: No cervical adenopathy.   Skin:    Findings: No erythema or rash.  Neurological:     Cranial Nerves: No cranial nerve deficit.     Motor: No abnormal muscle tone.     Coordination: Coordination normal.     Deep Tendon Reflexes: Reflexes normal.  Psychiatric:        Behavior: Behavior normal.        Thought Content: Thought content normal.        Judgment: Judgment normal.     Lab Results  Component Value Date   WBC 9.2 12/11/2019   HGB 16.0 (H) 12/11/2019   HCT 47.0 (H) 12/11/2019   PLT 280.0 12/11/2019   GLUCOSE 93 12/11/2019   CHOL 200 07/18/2018   TRIG 185.0 (H) 07/18/2018   HDL 39.70 07/18/2018   LDLDIRECT 160.0 01/02/2018   LDLCALC 124 (H) 07/18/2018   ALT 36 (H) 12/11/2019   AST 25 12/11/2019   NA 136 12/11/2019   K 3.7 12/11/2019   CL 102 12/11/2019   CREATININE 1.01 12/11/2019   BUN 25 (H) 12/11/2019   CO2 26 12/11/2019   TSH 1.58 12/11/2019    US Abdomen Complete  Result Date: 12/24/2019 CLINICAL DATA:  Intermittent left upper quadrant pain. EXAM: ABDOMEN ULTRASOUND COMPLETE COMPARISON:  Abdominal ultrasound dated February 18, 2014. FINDINGS: Gallbladder: No gallstones or wall thickening visualized. No sonographic Murphy sign noted by sonographer. Common bile duct: Diameter: 4 mm, normal. Liver: Unchanged 2.8 cm simple cyst in the right liver. 1.2 cm simple cyst in the left liver, not seen on the prior study. Patchy increased parenchymal echogenicity. Portal vein is patent on color Doppler imaging with normal direction of blood flow towards the liver. IVC: No abnormality visualized. Pancreas: Visualized portion unremarkable. Spleen: Size and appearance within normal limits. Right Kidney: Length: 10.1 cm. Echogenicity within normal limits. No mass or hydronephrosis visualized. 1.7 cm simple cyst. Left Kidney: Length: 10.4 cm. Echogenicity within normal limits. No mass or hydronephrosis visualized. 1.5 cm simple cyst. Abdominal aorta: No aneurysm visualized. Other findings: None. IMPRESSION: 1.  No acute abnormality. 2. Patchy increased parenchymal echogenicity of the liver, nonspecific but could represent steatosis or underlying hepatocellular disease. Correlate with LFTs. 3. SImple hepatic and bilateral renal cysts. Electronically Signed   By: Titus Dubin M.D.   On: 12/24/2019 16:40    Assessment & Plan:    Walker Kehr, MD

## 2020-01-21 NOTE — Assessment & Plan Note (Signed)
Hydrate well 

## 2020-01-21 NOTE — Assessment & Plan Note (Addendum)
GI ref - Dr Silverio Decamp [place] was ok ?needs CT, but she has a CRI Pt had cologuard - ?results

## 2020-01-21 NOTE — Assessment & Plan Note (Signed)
On Vit D 

## 2020-01-21 NOTE — Assessment & Plan Note (Signed)
Cont w/wt loss Reduced carbs

## 2020-01-21 NOTE — Assessment & Plan Note (Signed)
On Coreg, Spironolactone 

## 2020-01-21 NOTE — Assessment & Plan Note (Signed)
No relapse 

## 2020-01-21 NOTE — Addendum Note (Signed)
Addended by: Cresenciano Lick on: 01/21/2020 12:22 PM   Modules accepted: Orders

## 2020-01-22 LAB — COMPLETE METABOLIC PANEL WITH GFR
AG Ratio: 1.7 (calc) (ref 1.0–2.5)
ALT: 33 U/L — ABNORMAL HIGH (ref 6–29)
AST: 21 U/L (ref 10–35)
Albumin: 4.4 g/dL (ref 3.6–5.1)
Alkaline phosphatase (APISO): 105 U/L (ref 37–153)
BUN/Creatinine Ratio: 24 (calc) — ABNORMAL HIGH (ref 6–22)
BUN: 23 mg/dL (ref 7–25)
CO2: 23 mmol/L (ref 20–32)
Calcium: 10.4 mg/dL (ref 8.6–10.4)
Chloride: 105 mmol/L (ref 98–110)
Creat: 0.97 mg/dL — ABNORMAL HIGH (ref 0.60–0.93)
GFR, Est African American: 68 mL/min/{1.73_m2} (ref >=60)
GFR, Est Non African American: 59 mL/min/{1.73_m2} — ABNORMAL LOW (ref >=60)
Globulin: 2.6 g/dL (calc) (ref 1.9–3.7)
Glucose, Bld: 94 mg/dL (ref 65–99)
Potassium: 4.3 mmol/L (ref 3.5–5.3)
Sodium: 137 mmol/L (ref 135–146)
Total Bilirubin: 0.7 mg/dL (ref 0.2–1.2)
Total Protein: 7 g/dL (ref 6.1–8.1)

## 2020-02-09 ENCOUNTER — Other Ambulatory Visit: Payer: Self-pay | Admitting: Internal Medicine

## 2020-03-15 ENCOUNTER — Encounter: Payer: Self-pay | Admitting: Gastroenterology

## 2020-03-15 ENCOUNTER — Ambulatory Visit (INDEPENDENT_AMBULATORY_CARE_PROVIDER_SITE_OTHER): Payer: Medicare Other | Admitting: Gastroenterology

## 2020-03-15 VITALS — BP 110/70 | HR 71 | Ht 63.0 in | Wt 171.0 lb

## 2020-03-15 DIAGNOSIS — R1310 Dysphagia, unspecified: Secondary | ICD-10-CM

## 2020-03-15 DIAGNOSIS — R1011 Right upper quadrant pain: Secondary | ICD-10-CM

## 2020-03-15 DIAGNOSIS — R109 Unspecified abdominal pain: Secondary | ICD-10-CM | POA: Diagnosis not present

## 2020-03-15 DIAGNOSIS — R1032 Left lower quadrant pain: Secondary | ICD-10-CM | POA: Diagnosis not present

## 2020-03-15 DIAGNOSIS — R1012 Left upper quadrant pain: Secondary | ICD-10-CM | POA: Diagnosis not present

## 2020-03-15 DIAGNOSIS — Z1211 Encounter for screening for malignant neoplasm of colon: Secondary | ICD-10-CM
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
## 2020-03-15 NOTE — Patient Instructions (Addendum)
If you are age 71 or older, your body mass index should be between 23-30. Your Body mass index is 30.29 kg/m. If this is out of the aforementioned range listed, please consider follow up with your Primary Care Provider.  If you are age 43 or younger, your body mass index should be between 19-25. Your Body mass index is 30.29 kg/m. If this is out of the aformentioned range listed, please consider follow up with your Primary Care Provider.   You have been scheduled for an endoscopy and colonoscopy. Please follow the written instructions given to you at your visit today. Please pick up your prep supplies at the pharmacy within the next 1-3 days. If you use inhalers (even only as needed), please bring them with you on the day of your procedure.  Due to recent changes in healthcare laws, you may see the results of your imaging and laboratory studies on MyChart before your provider has had a chance to review them.  We understand that in some cases there may be results that are confusing or concerning to you. Not all laboratory results come back in the same time frame and the provider may be waiting for multiple results in order to interpret others.  Please give [place] 48 hours in order for your provider to thoroughly review all the results before contacting the office for clarification of your results.    Follow up in 3 months  Thank you for choosing me and [facility].  Harl Bowie , M.D

## 2020-03-21 ENCOUNTER — Ambulatory Visit: Payer: Medicare Other | Attending: Internal Medicine

## 2020-03-21 DIAGNOSIS — Z23 Encounter for immunization: Secondary | ICD-10-CM

## 2020-03-21 NOTE — Progress Notes (Signed)
   Covid-19 Vaccination Clinic  Name:  Dawn Howard    MRN: [id number] DOB: Oct 13, 1950  03/21/2020  [name] was observed post Covid-19 immunization for 15 minutes without incident. She was provided with Vaccine Information Sheet and instruction to access the V-Safe system.   [name] was instructed to call 911 with any severe reactions post vaccine: Marland Kitchen Difficulty breathing  . Swelling of face and throat  . A fast heartbeat  . A bad rash all over body  . Dizziness and weakness   Immunizations Administered    Name Date Dose VIS Date Route   Pfizer COVID-19 Vaccine 03/21/2020 11:04 AM 0.3 mL 02/25/2020 Intramuscular   Manufacturer: [place]   Lot: [id number]   [place]: [id number]

## 2020-04-08 DIAGNOSIS — G894 Chronic pain syndrome: Secondary | ICD-10-CM | POA: Diagnosis not present

## 2020-04-21 ENCOUNTER — Encounter: Payer: Self-pay | Admitting: Internal Medicine

## 2020-04-21 ENCOUNTER — Ambulatory Visit (INDEPENDENT_AMBULATORY_CARE_PROVIDER_SITE_OTHER): Payer: Medicare Other

## 2020-04-21 ENCOUNTER — Ambulatory Visit (INDEPENDENT_AMBULATORY_CARE_PROVIDER_SITE_OTHER): Payer: Medicare Other | Admitting: Internal Medicine

## 2020-04-21 ENCOUNTER — Other Ambulatory Visit: Payer: Self-pay

## 2020-04-21 VITALS — BP 120/88 | HR 67 | Temp 98.1°F | Resp 16 | Ht 63.0 in | Wt 167.8 lb

## 2020-04-21 DIAGNOSIS — N182 Chronic kidney disease, stage 2 (mild): Secondary | ICD-10-CM | POA: Diagnosis not present

## 2020-04-21 DIAGNOSIS — F419 Anxiety disorder, unspecified: Secondary | ICD-10-CM

## 2020-04-21 DIAGNOSIS — I10 Essential (primary) hypertension: Secondary | ICD-10-CM | POA: Diagnosis not present

## 2020-04-21 DIAGNOSIS — M109 Gout, unspecified: Secondary | ICD-10-CM | POA: Diagnosis not present

## 2020-04-21 DIAGNOSIS — E559 Vitamin D deficiency, unspecified: Secondary | ICD-10-CM | POA: Diagnosis not present

## 2020-04-21 DIAGNOSIS — D751 Secondary polycythemia: Secondary | ICD-10-CM

## 2020-04-21 DIAGNOSIS — Z0000 Encounter for general adult medical examination without abnormal findings: Secondary | ICD-10-CM | POA: Diagnosis not present

## 2020-04-21 LAB — CBC WITH DIFFERENTIAL/PLATELET
Basophils Absolute: 0.1 10*3/uL (ref 0.0–0.1)
Basophils Relative: 0.9 % (ref 0.0–3.0)
Eosinophils Absolute: 0.2 10*3/uL (ref 0.0–0.7)
Eosinophils Relative: 2.8 % (ref 0.0–5.0)
HCT: 45 % (ref 36.0–46.0)
Hemoglobin: 15.3 g/dL — ABNORMAL HIGH (ref 12.0–15.0)
Lymphocytes Relative: 22.6 % (ref 12.0–46.0)
Lymphs Abs: 1.9 10*3/uL (ref 0.7–4.0)
MCHC: 34 g/dL (ref 30.0–36.0)
MCV: 89.3 fl (ref 78.0–100.0)
Monocytes Absolute: 0.6 10*3/uL (ref 0.1–1.0)
Monocytes Relative: 6.7 % (ref 3.0–12.0)
Neutro Abs: 5.7 10*3/uL (ref 1.4–7.7)
Neutrophils Relative %: 67 % (ref 43.0–77.0)
Platelets: 258 10*3/uL (ref 150.0–400.0)
RBC: 5.04 Mil/uL (ref 3.87–5.11)
RDW: 13.5 % (ref 11.5–15.5)
WBC: 8.5 10*3/uL (ref 4.0–10.5)

## 2020-04-21 LAB — COMPREHENSIVE METABOLIC PANEL
ALT: 28 U/L (ref 0–35)
AST: 20 U/L (ref 0–37)
Albumin: 4.3 g/dL (ref 3.5–5.2)
Alkaline Phosphatase: 110 U/L (ref 39–117)
BUN: 24 mg/dL — ABNORMAL HIGH (ref 6–23)
CO2: 26 mEq/L (ref 19–32)
Calcium: 10.3 mg/dL (ref 8.4–10.5)
Chloride: 103 mEq/L (ref 96–112)
Creatinine, Ser: 0.93 mg/dL (ref 0.40–1.20)
GFR: 61.74 mL/min (ref >60.00)
Glucose, Bld: 91 mg/dL (ref 70–99)
Potassium: 4.2 mEq/L (ref 3.5–5.1)
Sodium: 136 mEq/L (ref 135–145)
Total Bilirubin: 0.6 mg/dL (ref 0.2–1.2)
Total Protein: 7.4 g/dL (ref 6.0–8.3)

## 2020-04-21 LAB — URIC ACID: Uric Acid, Serum: 6.3 mg/dL (ref 2.4–7.0)

## 2020-04-21 MED ORDER — ALLOPURINOL 100 MG PO TABS
50.0000 mg | ORAL_TABLET | Freq: Every day | ORAL | 3 refills | Status: DC
Start: 2020-04-21 — End: 2021-01-17

## 2020-04-21 NOTE — Assessment & Plan Note (Signed)
Vit D 

## 2020-04-21 NOTE — Assessment & Plan Note (Addendum)
Labs Hydration 

## 2020-04-21 NOTE — Assessment & Plan Note (Signed)
No relapse On Allopurinol - try 50 mg/d

## 2020-04-21 NOTE — Assessment & Plan Note (Signed)
On Coreg, Spironolactone CT Coronary calcium score of 0
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
## 2020-04-21 NOTE — Progress Notes (Signed)
Subjective:  Patient ID: Dawn Howard, female    DOB: 06-Aug-1950  Age: 69 y.o. MRN: [id number]  CC: Follow-up (3 month f/u)   HPI Tayen Narang presents for HTN, GERD, gout f/u  Outpatient Medications Prior to Visit  Medication Sig Dispense Refill  . allopurinol (ZYLOPRIM) 100 MG tablet TAKE 1 TABLET EACH DAY. 90 tablet 3  . b complex vitamins tablet Take 1 tablet by mouth daily. 100 tablet 0  . carvedilol (COREG) 12.5 MG tablet TAKE  (1)  TABLET TWICE A DAY WITH MEALS (BREAKFAST AND SUPPER) 180 tablet 3  . furosemide (LASIX) 20 MG tablet Take 1-2 tablets (20-40 mg total) by mouth daily as needed for edema. 30 tablet 3  . HYDROcodone-acetaminophen (NORCO) 7.5-325 MG tablet TK 1 T PO BID PRN 30 tablet 0  . Multiple Vitamin (MULITIVITAMIN WITH MINERALS) TABS Take 1 tablet by mouth daily.    Marland Kitchen spironolactone (ALDACTONE) 50 MG tablet TAKE 1 TABLET ONCE DAILY. 90 tablet 3  . triamcinolone ointment (KENALOG) 0.5 % Apply 1 application topically 2 (two) times daily. 30 g 0  . famotidine (PEPCID) 20 MG tablet Take 1 tablet (20 mg total) by mouth 2 (two) times daily. (Patient not taking: No sig reported) 60 tablet 5   No facility-administered medications prior to visit.    ROS: Review of Systems  Constitutional: Negative for activity change, appetite change, chills, fatigue and unexpected weight change.  HENT: Negative for congestion, mouth sores and sinus pressure.   Eyes: Negative for visual disturbance.  Respiratory: Negative for cough and chest tightness.   Gastrointestinal: Negative for abdominal pain and nausea.  Genitourinary: Negative for difficulty urinating, frequency and vaginal pain.  Musculoskeletal: Positive for arthralgias. Negative for back pain and gait problem.  Skin: Negative for pallor and rash.  Neurological: Negative for dizziness, tremors, weakness, numbness and headaches.  Psychiatric/Behavioral: Negative for confusion and sleep disturbance.    Objective:   BP 120/88 (BP Location: Left Arm)   Pulse 63   Temp 98.1 F (36.7 C) (Oral)   Wt 167 lb 12.8 oz (76.1 kg)   SpO2 97%   BMI 29.72 kg/m   BP Readings from Last 3 Encounters:  04/21/20 120/88  04/21/20 120/88  03/15/20 110/70    Wt Readings from Last 3 Encounters:  04/21/20 167 lb 12.8 oz (76.1 kg)  04/21/20 167 lb 12.8 oz (76.1 kg)  03/15/20 171 lb (77.6 kg)    Physical Exam Constitutional:      General: She is not in acute distress.    Appearance: She is well-developed.  HENT:     Head: Normocephalic.     Right Ear: External ear normal.     Left Ear: External ear normal.     Nose: Nose normal.     Mouth/Throat:     Mouth: Oropharynx is clear and moist.  Eyes:     General:        Right eye: No discharge.        Left eye: No discharge.     Conjunctiva/sclera: Conjunctivae normal.     Pupils: Pupils are equal, round, and reactive to light.  Neck:     Thyroid: No thyromegaly.     Vascular: No JVD.     Trachea: No tracheal deviation.  Cardiovascular:     Rate and Rhythm: Normal rate and regular rhythm.     Heart sounds: Normal heart sounds.  Pulmonary:     Effort: No respiratory distress.  Breath sounds: No stridor. No wheezing.  Abdominal:     General: Bowel sounds are normal. There is no distension.     Palpations: Abdomen is soft. There is no mass.     Tenderness: There is no abdominal tenderness. There is no guarding or rebound.  Musculoskeletal:        General: No tenderness or edema.     Cervical back: Normal range of motion and neck supple.  Lymphadenopathy:     Cervical: No cervical adenopathy.  Skin:    Findings: No erythema or rash.  Neurological:     Mental Status: She is oriented to person, place, and time.     Cranial Nerves: No cranial nerve deficit.     Motor: No abnormal muscle tone.     Coordination: Coordination normal.     Deep Tendon Reflexes: Reflexes normal.  Psychiatric:        Mood and Affect: Mood and affect normal.         Behavior: Behavior normal.        Thought Content: Thought content normal.        Judgment: Judgment normal.     Lab Results  Component Value Date   WBC 8.0 01/21/2020   HGB 16.0 (H) 01/21/2020   HCT 47.8 (H) 01/21/2020   PLT 287 01/21/2020   GLUCOSE 94 01/21/2020   CHOL 200 07/18/2018   TRIG 185.0 (H) 07/18/2018   HDL 39.70 07/18/2018   LDLDIRECT 160.0 01/02/2018   LDLCALC 124 (H) 07/18/2018   ALT 33 (H) 01/21/2020   AST 21 01/21/2020   NA 137 01/21/2020   K 4.3 01/21/2020   CL 105 01/21/2020   CREATININE 0.97 (H) 01/21/2020   BUN 23 01/21/2020   CO2 23 01/21/2020   TSH 1.58 12/11/2019    US Abdomen Complete  Result Date: 12/24/2019 CLINICAL DATA:  Intermittent left upper quadrant pain. EXAM: ABDOMEN ULTRASOUND COMPLETE COMPARISON:  Abdominal ultrasound dated February 18, 2014. FINDINGS: Gallbladder: No gallstones or wall thickening visualized. No sonographic Murphy sign noted by sonographer. Common bile duct: Diameter: 4 mm, normal. Liver: Unchanged 2.8 cm simple cyst in the right liver. 1.2 cm simple cyst in the left liver, not seen on the prior study. Patchy increased parenchymal echogenicity. Portal vein is patent on color Doppler imaging with normal direction of blood flow towards the liver. IVC: No abnormality visualized. Pancreas: Visualized portion unremarkable. Spleen: Size and appearance within normal limits. Right Kidney: Length: 10.1 cm. Echogenicity within normal limits. No mass or hydronephrosis visualized. 1.7 cm simple cyst. Left Kidney: Length: 10.4 cm. Echogenicity within normal limits. No mass or hydronephrosis visualized. 1.5 cm simple cyst. Abdominal aorta: No aneurysm visualized. Other findings: None. IMPRESSION: 1. No acute abnormality. 2. Patchy increased parenchymal echogenicity of the liver, nonspecific but could represent steatosis or underlying hepatocellular disease. Correlate with LFTs. 3. SImple hepatic and bilateral renal cysts. Electronically Signed    By: Titus Dubin M.D.   On: 12/24/2019 16:40    Assessment & Plan:    Walker Kehr, MD
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
## 2020-04-21 NOTE — Patient Instructions (Signed)
[name] , Thank you for taking time to come for your Medicare Wellness Visit. I appreciate your ongoing commitment to your health goals. Please review the following plan we discussed and let me know if I can assist you in the future.   Screening recommendations/referrals: Colonoscopy: never done Mammogram: 06/04/2018 Bone Density: 05/30/2018; due every 2 years Recommended yearly ophthalmology/optometry visit for glaucoma screening and checkup Recommended yearly dental visit for hygiene and checkup  Vaccinations: Influenza vaccine: 01/21/2020 Pneumococcal vaccine: up to date Tdap vaccine: 09/28/2011; due every 10 years Shingles vaccine: never done   Covid-19: up to date  Advanced directives: Advance directive discussed with you today. I have provided a copy for you to complete at home and have notarized. Once this is complete please bring a copy in to our office so we can scan it into your chart.  Conditions/risks identified: Yes; Reviewed health maintenance screenings with patient today and relevant education, vaccines, and/or referrals were provided. Please continue to do your personal lifestyle choices by: daily care of teeth and gums, regular physical activity (goal should be 5 days a week for 30 minutes), eat a healthy diet, avoid tobacco and drug use, limiting any alcohol intake, taking a low-dose aspirin (if not allergic or have been advised by your provider otherwise) and taking vitamins and minerals as recommended by your provider. Continue doing brain stimulating activities (puzzles, reading, adult coloring books, staying active) to keep memory sharp. Continue to eat heart healthy diet (full of fruits, vegetables, whole grains, lean protein, water--limit salt, fat, and sugar intake) and increase physical activity as tolerated.  Next appointment: Please schedule your next Medicare Wellness Visit with your Nurse Health Advisor in 1 year by calling [phone].   Preventive Care 24  Years and Older, Female Preventive care refers to lifestyle choices and visits with your health care provider that can promote health and wellness. What does preventive care include?  A yearly physical exam. This is also called an annual well check.  Dental exams once or twice a year.  Routine eye exams. Ask your health care provider how often you should have your eyes checked.  Personal lifestyle choices, including:  Daily care of your teeth and gums.  Regular physical activity.  Eating a healthy diet.  Avoiding tobacco and drug use.  Limiting alcohol use.  Practicing safe sex.  Taking low-dose aspirin every day.  Taking vitamin and mineral supplements as recommended by your health care provider. What happens during an annual well check? The services and screenings done by your health care provider during your annual well check will depend on your age, overall health, lifestyle risk factors, and family history of disease. Counseling  Your health care provider may ask you questions about your:  Alcohol use.  Tobacco use.  Drug use.  Emotional well-being.  Home and relationship well-being.  Sexual activity.  Eating habits.  History of falls.  Memory and ability to understand (cognition).  Work and work Statistician.  Reproductive health. Screening  You may have the following tests or measurements:  Height, weight, and BMI.  Blood pressure.  Lipid and cholesterol levels. These may be checked every 5 years, or more frequently if you are over 10 years old.  Skin check.  Lung cancer screening. You may have this screening every year starting at age 17 if you have a 30-pack-year history of smoking and currently smoke or have quit within the past 15 years.  Fecal occult blood test (FOBT) of the stool. You  may have this test every year starting at age 77.  Flexible sigmoidoscopy or colonoscopy. You may have a sigmoidoscopy every 5 years or a colonoscopy every  10 years starting at age 73.  Hepatitis C blood test.  Hepatitis B blood test.  Sexually transmitted disease (STD) testing.  Diabetes screening. This is done by checking your blood sugar (glucose) after you have not eaten for a while (fasting). You may have this done every 1-3 years.  Bone density scan. This is done to screen for osteoporosis. You may have this done starting at age 108.  Mammogram. This may be done every 1-2 years. Talk to your health care provider about how often you should have regular mammograms. Talk with your health care provider about your test results, treatment options, and if necessary, the need for more tests. Vaccines  Your health care provider may recommend certain vaccines, such as:  Influenza vaccine. This is recommended every year.  Tetanus, diphtheria, and acellular pertussis (Tdap, Td) vaccine. You may need a Td booster every 10 years.  Zoster vaccine. You may need this after age 50.  Pneumococcal 13-valent conjugate (PCV13) vaccine. One dose is recommended after age 45.  Pneumococcal polysaccharide (PPSV23) vaccine. One dose is recommended after age 59. Talk to your health care provider about which screenings and vaccines you need and how often you need them. This information is not intended to replace advice given to you by your health care provider. Make sure you discuss any questions you have with your health care provider. Document Released: 05/21/2015 Document Revised: 01/12/2016 Document Reviewed: 02/23/2015 Elsevier Interactive Patient Education  2017 [place] Prevention in the Home Falls can cause injuries. They can happen to people of all ages. There are many things you can do to make your home safe and to help prevent falls. What can I do on the outside of my home?  Regularly fix the edges of walkways and driveways and fix any cracks.  Remove anything that might make you trip as you walk through a door, such as a raised step  or threshold.  Trim any bushes or trees on the path to your home.  Use bright outdoor lighting.  Clear any walking paths of anything that might make someone trip, such as rocks or tools.  Regularly check to see if handrails are loose or broken. Make sure that both sides of any steps have handrails.  Any raised decks and porches should have guardrails on the edges.  Have any leaves, snow, or ice cleared regularly.  Use sand or salt on walking paths during winter.  Clean up any spills in your garage right away. This includes oil or grease spills. What can I do in the bathroom?  Use night lights.  Install grab bars by the toilet and in the tub and shower. Do not use towel bars as grab bars.  Use non-skid mats or decals in the tub or shower.  If you need to sit down in the shower, use a plastic, non-slip stool.  Keep the floor dry. Clean up any water that spills on the floor as soon as it happens.  Remove soap buildup in the tub or shower regularly.  Attach bath mats securely with double-sided non-slip rug tape.  Do not have throw rugs and other things on the floor that can make you trip. What can I do in the bedroom?  Use night lights.  Make sure that you have a light by your bed that is  easy to reach.  Do not use any sheets or blankets that are too big for your bed. They should not hang down onto the floor.  Have a firm chair that has side arms. You can use this for support while you get dressed.  Do not have throw rugs and other things on the floor that can make you trip. What can I do in the kitchen?  Clean up any spills right away.  Avoid walking on wet floors.  Keep items that you use a lot in easy-to-reach places.  If you need to reach something above you, use a strong step stool that has a grab bar.  Keep electrical cords out of the way.  Do not use floor polish or wax that makes floors slippery. If you must use wax, use non-skid floor wax.  Do not have  throw rugs and other things on the floor that can make you trip. What can I do with my stairs?  Do not leave any items on the stairs.  Make sure that there are handrails on both sides of the stairs and use them. Fix handrails that are broken or loose. Make sure that handrails are as long as the stairways.  Check any carpeting to make sure that it is firmly attached to the stairs. Fix any carpet that is loose or worn.  Avoid having throw rugs at the top or bottom of the stairs. If you do have throw rugs, attach them to the floor with carpet tape.  Make sure that you have a light switch at the top of the stairs and the bottom of the stairs. If you do not have them, ask someone to add them for you. What else can I do to help prevent falls?  Wear shoes that:  Do not have high heels.  Have rubber bottoms.  Are comfortable and fit you well.  Are closed at the toe. Do not wear sandals.  If you use a stepladder:  Make sure that it is fully opened. Do not climb a closed stepladder.  Make sure that both sides of the stepladder are locked into place.  Ask someone to hold it for you, if possible.  Clearly mark and make sure that you can see:  Any grab bars or handrails.  First and last steps.  Where the edge of each step is.  Use tools that help you move around (mobility aids) if they are needed. These include:  Canes.  Walkers.  Scooters.  Crutches.  Turn on the lights when you go into a dark area. Replace any light bulbs as soon as they burn out.  Set up your furniture so you have a clear path. Avoid moving your furniture around.  If any of your floors are uneven, fix them.  If there are any pets around you, be aware of where they are.  Review your medicines with your doctor. Some medicines can make you feel dizzy. This can increase your chance of falling. Ask your doctor what other things that you can do to help prevent falls. This information is not intended to  replace advice given to you by your health care provider. Make sure you discuss any questions you have with your health care provider. Document Released: 02/18/2009 Document Revised: 09/30/2015 Document Reviewed: 05/29/2014 Elsevier Interactive Patient Education  2017 [employer].

## 2020-04-21 NOTE — Assessment & Plan Note (Signed)
?  etiology; chronic No OSA - nl sleep test

## 2020-05-10 ENCOUNTER — Telehealth: Payer: Self-pay | Admitting: Gastroenterology

## 2020-05-10 NOTE — Telephone Encounter (Signed)
Called pt back.

## 2020-05-10 NOTE — Telephone Encounter (Signed)
Pls call pt, she has questions about the instructions for her procedure pertaining to some medications that she take including spirolactone and carvedilol that she needs to take with food. Pls call her.

## 2020-05-10 NOTE — Telephone Encounter (Signed)
Dr Lavon Paganini, I called patient back and she wanted to know what she is supposed to do for 3 days with her medications and eating solid food,  I explained to her that she could continue her medications with the diet that we gave her, she can drink the broth with her medications or eat jello, per patient she has to have "Solid Food" with her meds   Dr Lavon Paganini Please advise  She is on a 2 day prep

## 2020-05-11 NOTE — Telephone Encounter (Signed)
Patient returned call, Advised the patient of Dr Sharol Given recommendations. She has started her prep already but will incorporate soft foods such as applesauce for this evening then return to clear liquids, She took her medications in question this morning with no solid food and the result was only a small headache . She wants to not do the 4 am prep the morning of procedure if she has only clear stool. Recommended that she still try to do the second part because she is still having clear liquids. But with 2 day prep if she feels confidant she is cleaned out then she is ok to not do 4am prep but continue clear liquids. Per patient she will probably go ahead and do the 4am prep with fear of having to do the 2 day prep again.  Dr Lavon Paganini aware

## 2020-05-11 NOTE — Telephone Encounter (Signed)
Please advise patient to check with cardiologist if she is worried that her medications will not get absorbed with out solid food. For bowel prep she will need to stay on clear liquids for day before with no solid food. She can eat low residue soft diet 2 days prior to procedure with 2 days prep.Thanks

## 2020-05-13 ENCOUNTER — Ambulatory Visit (AMBULATORY_SURGERY_CENTER): Payer: Medicare Other | Admitting: Gastroenterology

## 2020-05-13 ENCOUNTER — Other Ambulatory Visit: Payer: Self-pay

## 2020-05-13 ENCOUNTER — Encounter: Payer: Self-pay | Admitting: Gastroenterology

## 2020-05-13 VITALS — BP 113/67 | HR 66 | Temp 97.1°F | Resp 11 | Ht 63.0 in | Wt 163.0 lb

## 2020-05-13 DIAGNOSIS — R1310 Dysphagia, unspecified: Secondary | ICD-10-CM | POA: Diagnosis not present

## 2020-05-13 DIAGNOSIS — K449 Diaphragmatic hernia without obstruction or gangrene: Secondary | ICD-10-CM | POA: Diagnosis not present

## 2020-05-13 DIAGNOSIS — K635 Polyp of colon: Secondary | ICD-10-CM | POA: Diagnosis not present

## 2020-05-13 DIAGNOSIS — R109 Unspecified abdominal pain: Secondary | ICD-10-CM

## 2020-05-13 DIAGNOSIS — K2100 Gastro-esophageal reflux disease with esophagitis, without bleeding: Secondary | ICD-10-CM | POA: Diagnosis not present

## 2020-05-13 DIAGNOSIS — K319 Disease of stomach and duodenum, unspecified: Secondary | ICD-10-CM

## 2020-05-13 DIAGNOSIS — K2900 Acute gastritis without bleeding: Secondary | ICD-10-CM

## 2020-05-13 DIAGNOSIS — K2970 Gastritis, unspecified, without bleeding: Secondary | ICD-10-CM

## 2020-05-13 DIAGNOSIS — D123 Benign neoplasm of transverse colon: Secondary | ICD-10-CM

## 2020-05-13 DIAGNOSIS — Z1211 Encounter for screening for malignant neoplasm of colon: Secondary | ICD-10-CM | POA: Diagnosis not present

## 2020-05-13 HISTORY — PX: COLONOSCOPY: SHX174

## 2020-05-13 HISTORY — PX: UPPER GASTROINTESTINAL ENDOSCOPY: SHX188

## 2020-05-13 MED ORDER — SODIUM CHLORIDE 0.9 % IV SOLN: 500.0000 mL | Freq: Once | INTRAVENOUS | Status: DC

## 2020-05-13 MED ORDER — PANTOPRAZOLE SODIUM 40 MG PO TBEC: 40.0000 mg | DELAYED_RELEASE_TABLET | Freq: Every day | ORAL | 0 refills | Status: DC

## 2020-05-13 NOTE — Progress Notes (Signed)
Pt's states no medical or surgical changes since previsit or office visit. ° ° °Vitals BC °

## 2020-05-13 NOTE — Progress Notes (Signed)
Called to room to assist during endoscopic procedure.  Patient ID and intended procedure confirmed with present staff. Received instructions for my participation in the procedure from the performing physician.  

## 2020-05-13 NOTE — Op Note (Signed)
[facility] Patient Name: Dawn Howard Procedure Date: 05/13/2020 8:43 AM MRN: [id number] Endoscopist: Napoleon Form , MD Age: 70 Referring MD:  Date of Birth: [birth year] Gender: Female Account #: [id number] Procedure:                Colonoscopy Indications:              Screening for colorectal malignant neoplasm Medicines:                Monitored Anesthesia Care Procedure:                Pre-Anesthesia Assessment:                           - Prior to the procedure, a History and Physical                            was performed, and patient medications and                            allergies were reviewed. The patient's tolerance of                            previous anesthesia was also reviewed. The risks                            and benefits of the procedure and the sedation                            options and risks were discussed with the patient.                            All questions were answered, and informed consent                            was obtained. Prior Anticoagulants: The patient has                            taken no previous anticoagulant or antiplatelet                            agents. ASA Grade Assessment: II - A patient with                            mild systemic disease. After reviewing the risks                            and benefits, the patient was deemed in                            satisfactory condition to undergo the procedure.                           After obtaining informed consent, the colonoscope  was passed under direct vision. Throughout the                            procedure, the patient's blood pressure, pulse, and                            oxygen saturations were monitored continuously. The                            Olympus PCF-H190DL [id number]) Colonoscope was                            introduced through the anus and advanced to the the                            cecum,  identified by appendiceal orifice and                            ileocecal valve. The colonoscopy was performed                            without difficulty. The patient tolerated the                            procedure well. The quality of the bowel                            preparation was good. The ileocecal valve,                            appendiceal orifice, and rectum were photographed. Scope In: 9:10:30 AM Scope Out: 9:28:34 AM Scope Withdrawal Time: 0 hours 12 minutes 22 seconds  Total Procedure Duration: 0 hours 18 minutes 4 seconds  Findings:                 The perianal and digital rectal examinations were                            normal.                           Two sessile polyps were found in the transverse                            colon. The polyps were 7 to 9 mm in size. These                            polyps were removed with a cold snare. Resection                            and retrieval were complete.                           A 3 mm polyp was found in the transverse colon. The  polyp was sessile. The polyp was removed with a                            cold biopsy forceps. Resection and retrieval were                            complete.                           Non-bleeding internal hemorrhoids were found during                            retroflexion. The hemorrhoids were medium-sized. Complications:            No immediate complications. Estimated Blood Loss:     Estimated blood loss was minimal. Impression:               - Two 7 to 9 mm polyps in the transverse colon,                            removed with a cold snare. Resected and retrieved.                           - One 3 mm polyp in the transverse colon, removed                            with a cold biopsy forceps. Resected and retrieved.                           - Non-bleeding internal hemorrhoids. Recommendation:           - Patient has a contact number available  for                            emergencies. The signs and symptoms of potential                            delayed complications were discussed with the                            patient. Return to normal activities tomorrow.                            Written discharge instructions were provided to the                            patient.                           - Resume previous diet.                           - Continue present medications.                           - Await pathology results.                           -  Repeat colonoscopy in 3 years for surveillance                            based on pathology results. Mauri Pole, MD 05/13/2020 9:47:50 AM This report has been signed electronically.

## 2020-05-13 NOTE — Op Note (Addendum)
[facility] Patient Name: Dawn Howard Procedure Date: 05/13/2020 8:44 AM MRN: [id number] Endoscopist: Mauri Pole , MD Age: 70 Referring MD:  Date of Birth: Sep 24, 1950 Gender: Female Account #: [id number] Procedure:                Upper GI endoscopy Indications:              Dysphagia, Dyspepsia, Heartburn, Globus sensation Medicines:                Monitored Anesthesia Care Procedure:                Pre-Anesthesia Assessment:                           - Prior to the procedure, a History and Physical                            was performed, and patient medications and                            allergies were reviewed. The patient's tolerance of                            previous anesthesia was also reviewed. The risks                            and benefits of the procedure and the sedation                            options and risks were discussed with the patient.                            All questions were answered, and informed consent                            was obtained. Prior Anticoagulants: The patient has                            taken no previous anticoagulant or antiplatelet                            agents. ASA Grade Assessment: II - A patient with                            mild systemic disease. After reviewing the risks                            and benefits, the patient was deemed in                            satisfactory condition to undergo the procedure.                           After obtaining informed consent, the endoscope was  passed under direct vision. Throughout the                            procedure, the patient's blood pressure, pulse, and                            oxygen saturations were monitored continuously. The                            Endoscope was introduced through the mouth, and                            advanced to the second part of duodenum. The upper                             GI endoscopy was accomplished without difficulty.                            The patient tolerated the procedure well. Scope In: Scope Out: Findings:                 LA Grade C (one or more mucosal breaks continuous                            between tops of 2 or more mucosal folds, less than                            75% circumference) esophagitis with no bleeding was                            found 34 to 36 cm from the incisors.                           A medium-sized hiatal hernia was present.                           The gastroesophageal flap valve was visualized                            endoscopically and classified as Hill Grade IV (no                            fold, wide open lumen, hiatal hernia present).                           Patchy moderate inflammation characterized by                            congestion (edema), erosions and erythema was found                            in the gastric antrum and in the prepyloric region  of the stomach. Biopsies were taken with a cold                            forceps for Helicobacter pylori testing.                           The examined duodenum was normal. Complications:            No immediate complications. Estimated Blood Loss:     Estimated blood loss was minimal. Impression:               - LA Grade C reflux esophagitis with no bleeding.                           - Medium-sized hiatal hernia.                           - Gastroesophageal flap valve classified as Hill                            Grade IV (no fold, wide open lumen, hiatal hernia                            present).                           - Erosive gastritis. Biopsied.                           - Normal examined duodenum. Recommendation:           - Patient has a contact number available for                            emergencies. The signs and symptoms of potential                            delayed complications were discussed  with the                            patient. Return to normal activities tomorrow.                            Written discharge instructions were provided to the                            patient.                           - Resume previous diet.                           - Continue present medications.                           - Await pathology results.                           -  Follow an antireflux regimen.                           - Use Protonix (pantoprazole) 40 mg PO daily for 3                            months.                           - Return to GI office in 3 months. Please call to                            schedule appointment. Mauri Pole, MD 05/13/2020 9:45:23 AM This report has been signed electronically.

## 2020-05-13 NOTE — Patient Instructions (Signed)
HANDOUTS PROVIDED ON: ESOPHAGITIS, GASTRITIS, HIATAL HERNIA, POLYPS, & HEMORRHOIDS  The polyps removed/biopsies taken today have been sent for pathology.  The results can take 1-3 weeks to receive.  When your next colonoscopy should occur will be based on the pathology results.    You may resume your previous diet and medication schedule.  A prescription for pantoprazole (Protonix) 40 mg has been sent to your pharmacy.  This medication should be taken 30-60 minutes before the first meal of the day for 3 months.  Thank you for allowing [place] to care for you today!!!   YOU HAD AN ENDOSCOPIC PROCEDURE TODAY AT THE [facility]:   Refer to the procedure report that was given to you for any specific questions about what was found during the examination.  If the procedure report does not answer your questions, please call your gastroenterologist to clarify.  If you requested that your care partner not be given the details of your procedure findings, then the procedure report has been included in a sealed envelope for you to review at your convenience later.  YOU SHOULD EXPECT: Some feelings of bloating in the abdomen. Passage of more gas than usual.  Walking can help get rid of the air that was put into your GI tract during the procedure and reduce the bloating. If you had a lower endoscopy (such as a colonoscopy or flexible sigmoidoscopy) you may notice spotting of blood in your stool or on the toilet paper. If you underwent a bowel prep for your procedure, you may not have a normal bowel movement for a few days.  Please Note:  You might notice some irritation and congestion in your nose or some drainage.  This is from the oxygen used during your procedure.  There is no need for concern and it should clear up in a day or so.  SYMPTOMS TO REPORT IMMEDIATELY:   Following lower endoscopy (colonoscopy or flexible sigmoidoscopy):  Excessive amounts of blood in the stool  Significant tenderness  or worsening of abdominal pains  Swelling of the abdomen that is new, acute  Fever of 100F or higher   Following upper endoscopy (EGD)  Vomiting of blood or coffee ground material  New chest pain or pain under the shoulder blades  Painful or persistently difficult swallowing  New shortness of breath  Fever of 100F or higher  Black, tarry-looking stools  For urgent or emergent issues, a gastroenterologist can be reached at any hour by calling [phone]. Do not use MyChart messaging for urgent concerns.    DIET:  We do recommend a small meal at first, but then you may proceed to your regular diet.  Drink plenty of fluids but you should avoid alcoholic beverages for 24 hours.  ACTIVITY:  You should plan to take it easy for the rest of today and you should NOT DRIVE or use heavy machinery until tomorrow (because of the sedation medicines used during the test).    FOLLOW UP: Our staff will call the number listed on your records Monday morning between 7:15am and 8:15 am to check on you and address any questions or concerns that you may have regarding the information given to you following your procedure. If we do not reach you, we will leave a message.  We will attempt to reach you two times.  During this call, we will ask if you have developed any symptoms of COVID 19. If you develop any symptoms (ie: fever, flu-like symptoms, shortness of breath,  cough etc.) before then, please call [phone].  If you test positive for Covid 19 in the 2 weeks post procedure, please call and report this information to [place].    If any biopsies were taken you will be contacted by phone or by letter within the next 1-3 weeks.  Please call us at [phone] if you have not heard about the biopsies in 3 weeks.    SIGNATURES/CONFIDENTIALITY: You and/or your care partner have signed paperwork which will be entered into your electronic medical record.  These signatures attest to the fact that that the  information above on your After Visit Summary has been reviewed and is understood.  Full responsibility of the confidentiality of this discharge information lies with you and/or your care-partner.

## 2020-05-13 NOTE — Progress Notes (Signed)
A/ox3, pleased with MAC, report to RN 

## 2020-05-17 ENCOUNTER — Telehealth: Payer: Self-pay | Admitting: *Deleted

## 2020-05-17 NOTE — Telephone Encounter (Signed)
  Follow up Call-  Call back number 05/13/2020  Post procedure Call Back phone  # [phone]  Permission to leave phone message Yes  Some recent data might be hidden     Patient questions:  Do you have a fever, pain , or abdominal swelling? No. Pain Score  0 *  Have you tolerated food without any problems? Yes.    Have you been able to return to your normal activities? Yes.    Do you have any questions about your discharge instructions: Diet   No. Medications  No. Follow up visit  No.  Do you have questions or concerns about your Care? No.  Actions: * If pain score is 4 or above: No action needed, pain <4.  1. Have you developed a fever since your procedure? no  2.   Have you had an respiratory symptoms (SOB or cough) since your procedure? no  3.   Have you tested positive for COVID 19 since your procedure no  4.   Have you had any family members/close contacts diagnosed with the COVID 19 since your procedure?  no   If yes to any of these questions please route to Joylene John, RN and Joella Prince, RN

## 2020-05-18 ENCOUNTER — Telehealth: Payer: Self-pay | Admitting: Gastroenterology

## 2020-05-18 NOTE — Telephone Encounter (Signed)
Pt called inquiring about her path results. pls call pt when they are ready.

## 2020-05-25 ENCOUNTER — Encounter: Payer: Self-pay | Admitting: Gastroenterology

## 2020-07-12 DIAGNOSIS — Z79899 Other long term (current) drug therapy: Secondary | ICD-10-CM | POA: Diagnosis not present

## 2020-07-12 DIAGNOSIS — Z5181 Encounter for therapeutic drug level monitoring: Secondary | ICD-10-CM | POA: Diagnosis not present

## 2020-07-12 DIAGNOSIS — M5459 Other low back pain: Secondary | ICD-10-CM | POA: Diagnosis not present

## 2020-07-12 DIAGNOSIS — M5030 Other cervical disc degeneration, unspecified cervical region: Secondary | ICD-10-CM | POA: Diagnosis not present

## 2020-07-12 DIAGNOSIS — M5136 Other intervertebral disc degeneration, lumbar region: Secondary | ICD-10-CM | POA: Diagnosis not present

## 2020-07-16 IMAGING — MG DIGITAL DIAGNOSTIC BILATERAL MAMMOGRAM WITH TOMO AND CAD
6 of 10 series · 6 of 30 positions shown · non-contrast
Comparison: Previous exam(s).

ACR Breast Density Category a: The breast tissue is almost entirely
fatty.

CLINICAL DATA: 67-year-old female presenting for evaluation of pain
in the inferior right breast for the past 2 weeks which has been
improving.

EXAM:
DIGITAL DIAGNOSTIC BILATERAL MAMMOGRAM WITH CAD AND TOMO
ULTRASOUND RIGHT BREAST

[L CC synth-2D]
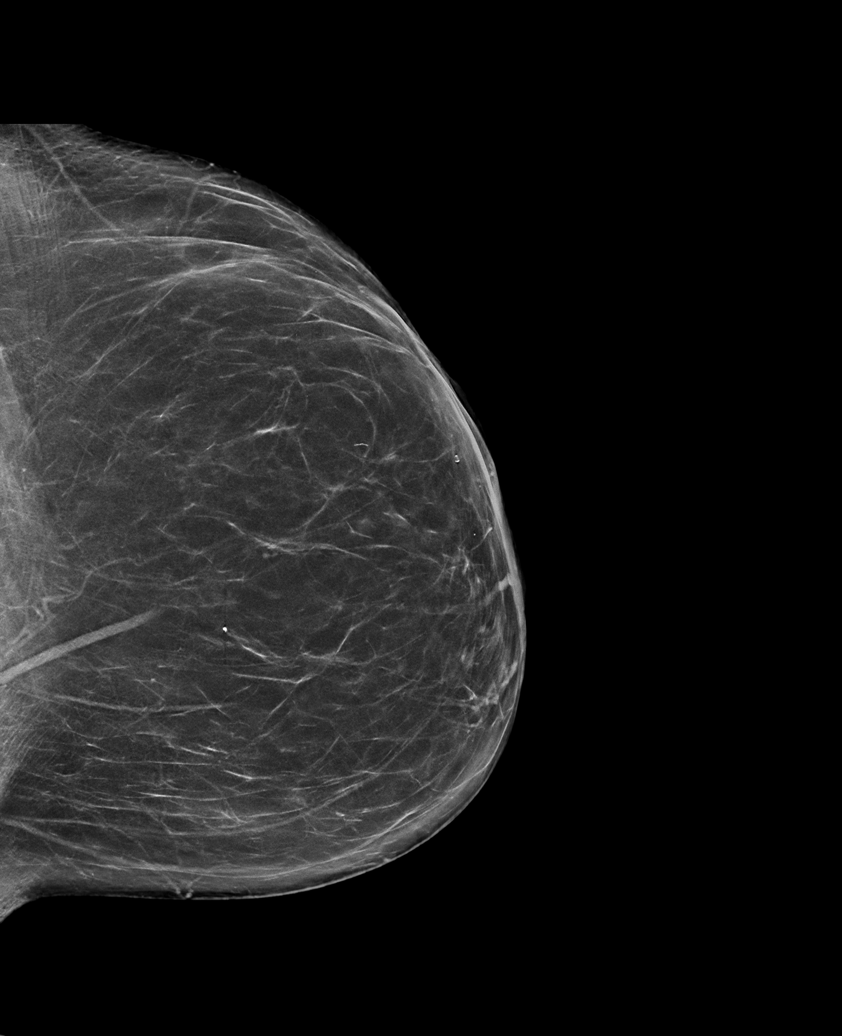

[L MLO synth-2D]
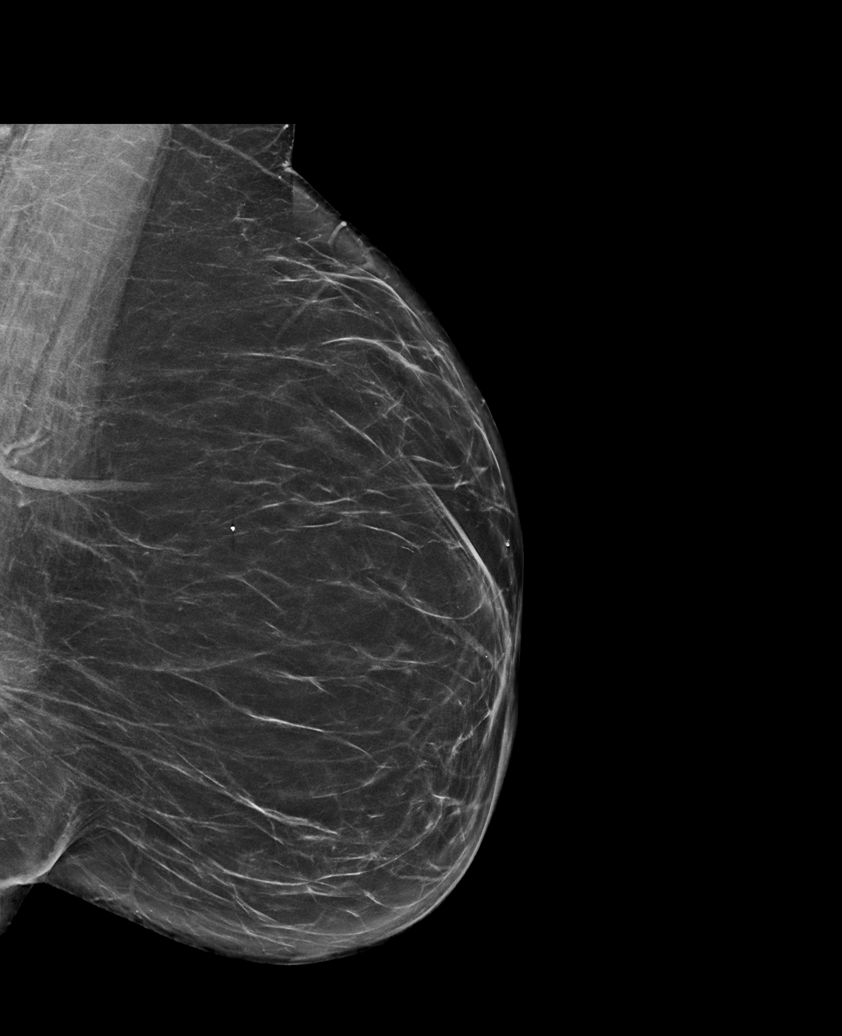

[R TAN synth-2D]
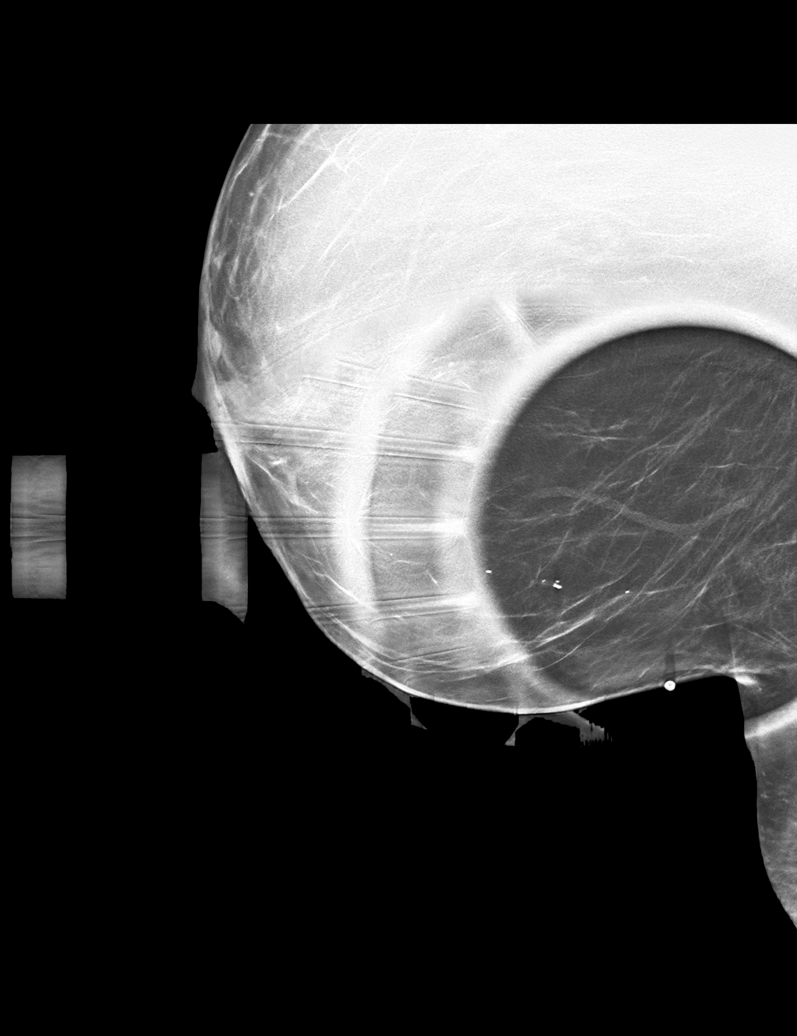

[R CC synth-2D]
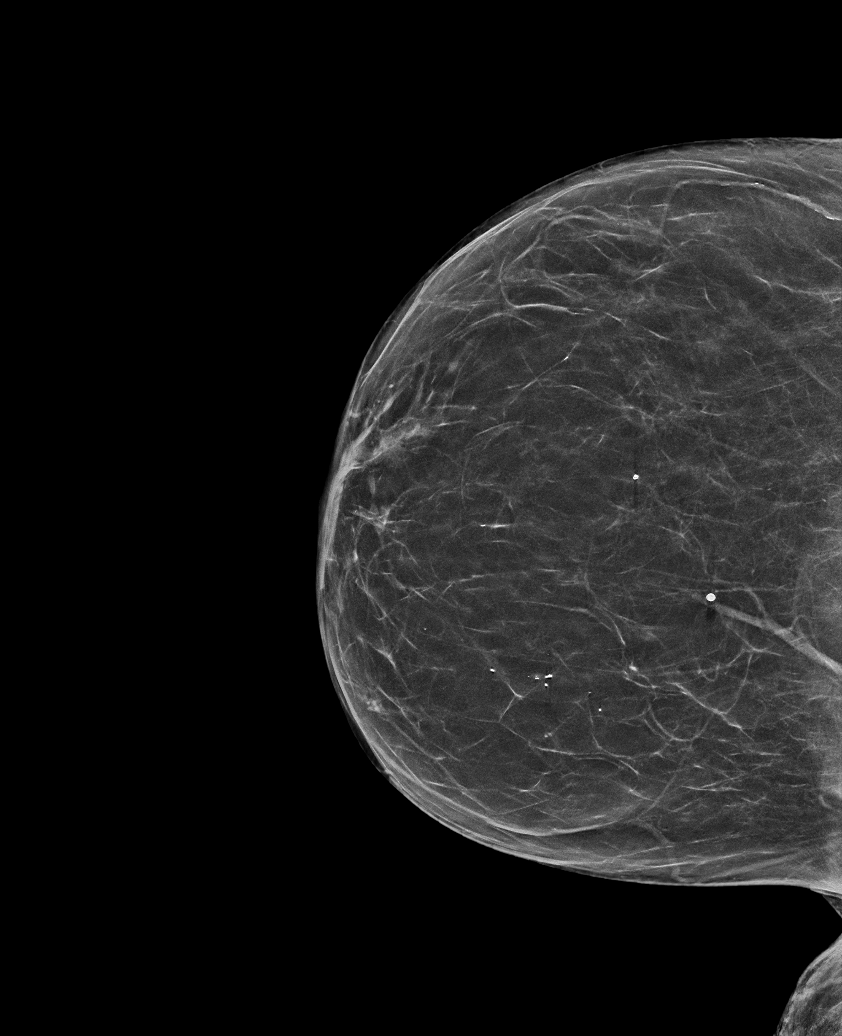

[R MLO synth-2D]
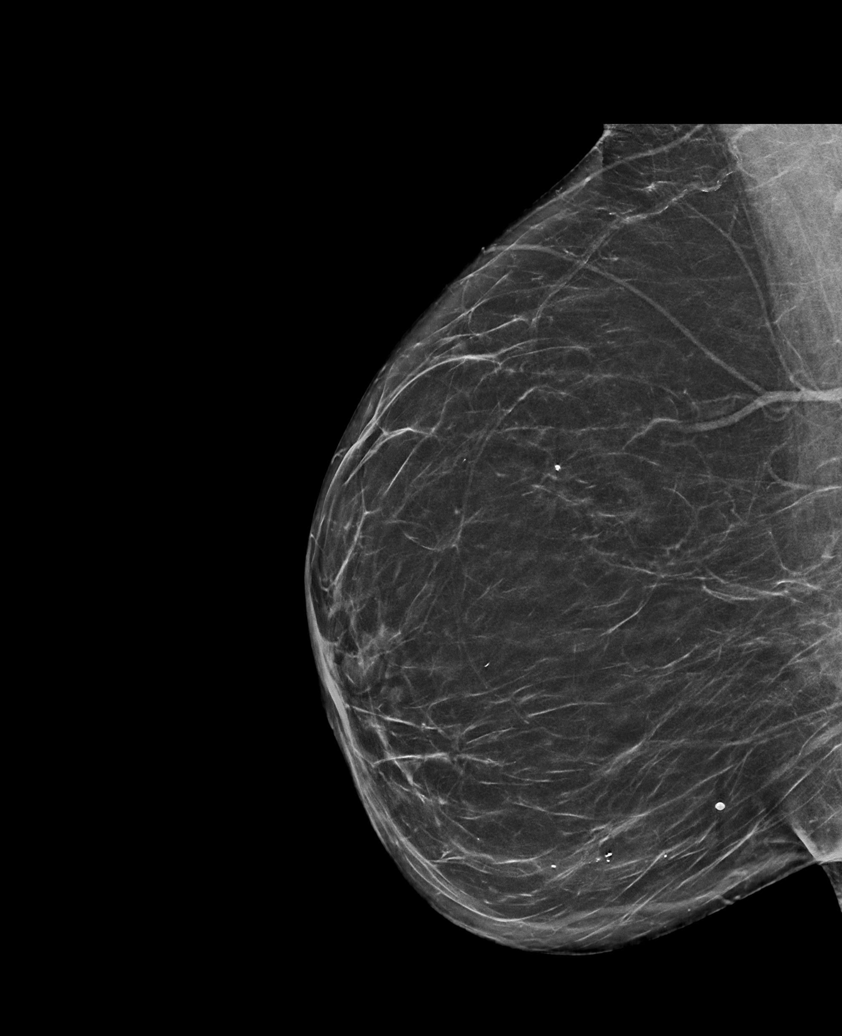

[R TAN tomo · tomo slice 27/54.0]
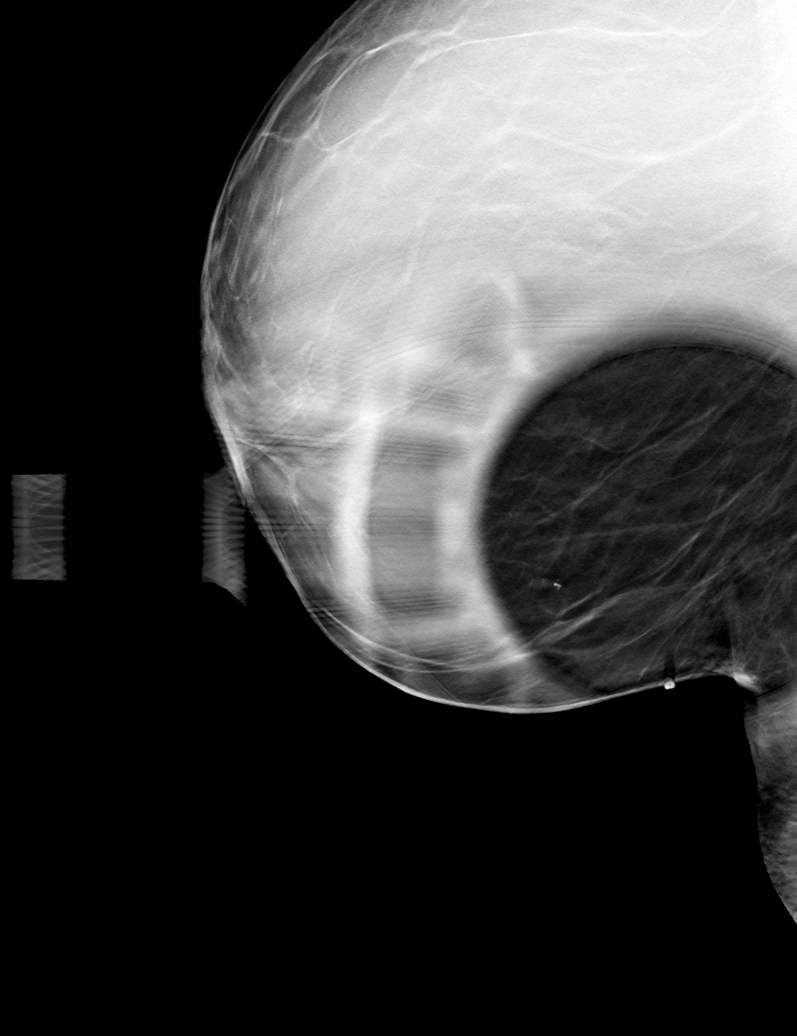

[6 of 30 positions shown; findings below may reference images not displayed]

FINDINGS: No suspicious calcifications, masses or areas of distortion are seen
in the bilateral breasts.

Mammographic images were processed with CAD.

On physical exam, no suspicious palpable masses are identified in
inferior right breast.

Targeted ultrasound is performed, showing normal fibroglandular
tissue in the inferior right breast. No masses or suspicious areas
of shadowing are identified.
IMPRESSION: 1. There are no mammographic or targeted sonographic abnormalities
in the inferior right breast to explain the patient's breast pain.

2.  No mammographic evidence of malignancy in the bilateral breasts.

RECOMMENDATION:
1. Clinical follow-up recommended for the tender area of concern in
the inferior right breast. Any further workup should be based on
clinical grounds.

2.  Screening mammogram in one year.(Code:6V-E-590)

I have discussed the findings and recommendations with the patient.
Results were also provided in writing at the conclusion of the
visit. If applicable, a reminder letter will be sent to the patient
regarding the next appointment.

BI-RADS CATEGORY  1: Negative.

## 2020-08-24 ENCOUNTER — Ambulatory Visit (INDEPENDENT_AMBULATORY_CARE_PROVIDER_SITE_OTHER): Payer: Medicare Other | Admitting: Internal Medicine

## 2020-08-24 ENCOUNTER — Other Ambulatory Visit: Payer: Self-pay

## 2020-08-24 ENCOUNTER — Encounter: Payer: Self-pay | Admitting: Internal Medicine

## 2020-08-24 VITALS — BP 120/72 | HR 72 | Temp 97.7°F | Ht 63.0 in | Wt 174.2 lb

## 2020-08-24 DIAGNOSIS — E559 Vitamin D deficiency, unspecified: Secondary | ICD-10-CM

## 2020-08-24 DIAGNOSIS — R739 Hyperglycemia, unspecified: Secondary | ICD-10-CM | POA: Diagnosis not present

## 2020-08-24 DIAGNOSIS — I10 Essential (primary) hypertension: Secondary | ICD-10-CM | POA: Diagnosis not present

## 2020-08-24 DIAGNOSIS — D751 Secondary polycythemia: Secondary | ICD-10-CM

## 2020-08-24 NOTE — Assessment & Plan Note (Signed)
On Vit D 

## 2020-08-24 NOTE — Assessment & Plan Note (Signed)
On Coreg, Spironolactone

## 2020-08-24 NOTE — Progress Notes (Signed)
Subjective:  Patient ID: Dawn Howard, female    DOB: 10-Sep-1950  Age: 70 y.o. MRN: [id number]  CC: Follow-up (4 month f/u)   HPI [name] presents for HTN, GERD, dyslipidemia f/u C/o R earache x 2 wks - resolved  Outpatient Medications Prior to Visit  Medication Sig Dispense Refill  . allopurinol (ZYLOPRIM) 100 MG tablet Take 0.5 tablets (50 mg total) by mouth daily. 90 tablet 3  . b complex vitamins tablet Take 1 tablet by mouth daily. 100 tablet 0  . carvedilol (COREG) 12.5 MG tablet TAKE  (1)  TABLET TWICE A DAY WITH MEALS (BREAKFAST AND SUPPER) 180 tablet 3  . furosemide (LASIX) 20 MG tablet Take 1-2 tablets (20-40 mg total) by mouth daily as needed for edema. 30 tablet 3  . HYDROcodone-acetaminophen (NORCO) 7.5-325 MG tablet TK 1 T PO BID PRN 30 tablet 0  . MEGARED OMEGA-3 KRILL OIL PO Take by mouth.    . Multiple Vitamin (MULITIVITAMIN WITH MINERALS) TABS Take 1 tablet by mouth daily.    . pantoprazole (PROTONIX) 40 MG tablet Take 1 tablet (40 mg total) by mouth daily. 90 tablet 0  . spironolactone (ALDACTONE) 50 MG tablet TAKE 1 TABLET ONCE DAILY. 90 tablet 3  . triamcinolone ointment (KENALOG) 0.5 % Apply 1 application topically 2 (two) times daily. (Patient not taking: Reported on 08/24/2020) 30 g 0   No facility-administered medications prior to visit.    ROS: Review of Systems  Constitutional: Negative for activity change, appetite change, chills, fatigue and unexpected weight change.  HENT: Positive for ear pain. Negative for congestion, mouth sores and sinus pressure.   Eyes: Negative for visual disturbance.  Respiratory: Negative for cough and chest tightness.   Gastrointestinal: Negative for abdominal pain and nausea.  Genitourinary: Negative for difficulty urinating, frequency and vaginal pain.  Musculoskeletal: Negative for back pain and gait problem.  Skin: Negative for pallor and rash.  Neurological: Negative for dizziness, tremors, weakness,  numbness and headaches.  Psychiatric/Behavioral: Negative for confusion and sleep disturbance. The patient is not nervous/anxious.     Objective:  BP 120/72 (BP Location: Left Arm)   Pulse 72   Temp 97.7 F (36.5 C) (Oral)   Ht 5\' 3"  (1.6 m)   Wt 174 lb 3.2 oz (79 kg)   SpO2 96%   BMI 30.86 kg/m   BP Readings from Last 3 Encounters:  08/24/20 120/72  05/13/20 113/67  04/21/20 120/88    Wt Readings from Last 3 Encounters:  08/24/20 174 lb 3.2 oz (79 kg)  05/13/20 163 lb (73.9 kg)  04/21/20 167 lb 12.8 oz (76.1 kg)    Physical Exam Constitutional:      General: She is not in acute distress.    Appearance: She is well-developed.  HENT:     Head: Normocephalic.     Right Ear: External ear normal.     Left Ear: External ear normal.     Nose: Nose normal.  Eyes:     General:        Right eye: No discharge.        Left eye: No discharge.     Conjunctiva/sclera: Conjunctivae normal.     Pupils: Pupils are equal, round, and reactive to light.  Neck:     Thyroid: No thyromegaly.     Vascular: No JVD.     Trachea: No tracheal deviation.  Cardiovascular:     Rate and Rhythm: Normal rate and regular rhythm.  Heart sounds: Normal heart sounds.  Pulmonary:     Effort: No respiratory distress.     Breath sounds: No stridor. No wheezing.  Abdominal:     General: Bowel sounds are normal. There is no distension.     Palpations: Abdomen is soft. There is no mass.     Tenderness: There is no abdominal tenderness. There is no guarding or rebound.  Musculoskeletal:        General: No tenderness.     Cervical back: Normal range of motion and neck supple.  Lymphadenopathy:     Cervical: No cervical adenopathy.  Skin:    Findings: No erythema or rash.  Neurological:     Mental Status: She is oriented to person, place, and time.     Cranial Nerves: No cranial nerve deficit.     Motor: No abnormal muscle tone.     Coordination: Coordination normal.     Deep Tendon Reflexes:  Reflexes normal.  Psychiatric:        Behavior: Behavior normal.        Thought Content: Thought content normal.        Judgment: Judgment normal.     Lab Results  Component Value Date   WBC 8.5 04/21/2020   HGB 15.3 (H) 04/21/2020   HCT 45.0 04/21/2020   PLT 258.0 04/21/2020   GLUCOSE 91 04/21/2020   CHOL 200 07/18/2018   TRIG 185.0 (H) 07/18/2018   HDL 39.70 07/18/2018   LDLDIRECT 160.0 01/02/2018   LDLCALC 124 (H) 07/18/2018   ALT 28 04/21/2020   AST 20 04/21/2020   NA 136 04/21/2020   K 4.2 04/21/2020   CL 103 04/21/2020   CREATININE 0.93 04/21/2020   BUN 24 (H) 04/21/2020   CO2 26 04/21/2020   TSH 1.58 12/11/2019    US Abdomen Complete  Result Date: 12/24/2019 CLINICAL DATA:  Intermittent left upper quadrant pain. EXAM: ABDOMEN ULTRASOUND COMPLETE COMPARISON:  Abdominal ultrasound dated February 18, 2014. FINDINGS: Gallbladder: No gallstones or wall thickening visualized. No sonographic Murphy sign noted by sonographer. Common bile duct: Diameter: 4 mm, normal. Liver: Unchanged 2.8 cm simple cyst in the right liver. 1.2 cm simple cyst in the left liver, not seen on the prior study. Patchy increased parenchymal echogenicity. Portal vein is patent on color Doppler imaging with normal direction of blood flow towards the liver. IVC: No abnormality visualized. Pancreas: Visualized portion unremarkable. Spleen: Size and appearance within normal limits. Right Kidney: Length: 10.1 cm. Echogenicity within normal limits. No mass or hydronephrosis visualized. 1.7 cm simple cyst. Left Kidney: Length: 10.4 cm. Echogenicity within normal limits. No mass or hydronephrosis visualized. 1.5 cm simple cyst. Abdominal aorta: No aneurysm visualized. Other findings: None. IMPRESSION: 1. No acute abnormality. 2. Patchy increased parenchymal echogenicity of the liver, nonspecific but could represent steatosis or underlying hepatocellular disease. Correlate with LFTs. 3. SImple hepatic and bilateral  renal cysts. Electronically Signed   By: Titus Dubin M.D.   On: 12/24/2019 16:40    Assessment & Plan:    Walker Kehr, MD

## 2020-09-17 ENCOUNTER — Telehealth: Payer: Self-pay | Admitting: Internal Medicine

## 2020-09-17 NOTE — Progress Notes (Signed)
  Chronic Care Management   Note  09/17/2020 Name: Dawn Howard MRN: [id number] DOB: 02-27-1951  Dawn Howard is a 70 y.o. year old female who is a primary care patient of Plotnikov, Evie Lacks, MD. I reached out to [name] by phone today in response to a referral sent by Dawn Howard's PCP, Plotnikov, Evie Lacks, MD.   Dawn Howard was given information about Chronic Care Management services today including:  1. CCM service includes personalized support from designated clinical staff supervised by her physician, including individualized plan of care and coordination with other care providers 2. 24/7 contact phone numbers for assistance for urgent and routine care needs. 3. Service will only be billed when office clinical staff spend 20 minutes or more in a month to coordinate care. 4. Only one practitioner may furnish and bill the service in a calendar month. 5. The patient may stop CCM services at any time (effective at the end of the month) by phone call to the office staff.   Patient wishes to consider information provided and/or speak with a member of the care team before deciding about enrollment in care management services.   Follow up plan:   [place]

## 2020-10-05 DIAGNOSIS — H26491 Other secondary cataract, right eye: Secondary | ICD-10-CM | POA: Diagnosis not present

## 2020-10-07 DIAGNOSIS — H16141 Punctate keratitis, right eye: Secondary | ICD-10-CM | POA: Diagnosis not present

## 2020-10-07 DIAGNOSIS — S0501XA Injury of conjunctiva and corneal abrasion without foreign body, right eye, initial encounter: Secondary | ICD-10-CM | POA: Diagnosis not present

## 2020-11-17 ENCOUNTER — Other Ambulatory Visit (INDEPENDENT_AMBULATORY_CARE_PROVIDER_SITE_OTHER): Payer: Medicare Other

## 2020-11-17 DIAGNOSIS — R739 Hyperglycemia, unspecified: Secondary | ICD-10-CM

## 2020-11-17 DIAGNOSIS — I10 Essential (primary) hypertension: Secondary | ICD-10-CM

## 2020-11-17 DIAGNOSIS — D751 Secondary polycythemia: Secondary | ICD-10-CM

## 2020-11-17 LAB — CBC WITH DIFFERENTIAL/PLATELET
Basophils Absolute: 0.1 10*3/uL (ref 0.0–0.1)
Basophils Relative: 0.6 % (ref 0.0–3.0)
Eosinophils Absolute: 0.2 10*3/uL (ref 0.0–0.7)
Eosinophils Relative: 2.4 % (ref 0.0–5.0)
HCT: 44.3 % (ref 36.0–46.0)
Hemoglobin: 15.3 g/dL — ABNORMAL HIGH (ref 12.0–15.0)
Lymphocytes Relative: 24.5 % (ref 12.0–46.0)
Lymphs Abs: 2.1 10*3/uL (ref 0.7–4.0)
MCHC: 34.4 g/dL (ref 30.0–36.0)
MCV: 88.7 fl (ref 78.0–100.0)
Monocytes Absolute: 0.5 10*3/uL (ref 0.1–1.0)
Monocytes Relative: 5.7 % (ref 3.0–12.0)
Neutro Abs: 5.8 10*3/uL (ref 1.4–7.7)
Neutrophils Relative %: 66.8 % (ref 43.0–77.0)
Platelets: 242 10*3/uL (ref 150.0–400.0)
RBC: 5 Mil/uL (ref 3.87–5.11)
RDW: 13.4 % (ref 11.5–15.5)
WBC: 8.7 10*3/uL (ref 4.0–10.5)

## 2020-11-17 LAB — COMPREHENSIVE METABOLIC PANEL
ALT: 27 U/L (ref 0–35)
AST: 19 U/L (ref 0–37)
Albumin: 4.1 g/dL (ref 3.5–5.2)
Alkaline Phosphatase: 99 U/L (ref 39–117)
BUN: 26 mg/dL — ABNORMAL HIGH (ref 6–23)
CO2: 26 mEq/L (ref 19–32)
Calcium: 10 mg/dL (ref 8.4–10.5)
Chloride: 102 mEq/L (ref 96–112)
Creatinine, Ser: 1.12 mg/dL (ref 0.40–1.20)
GFR: 49.2 mL/min — ABNORMAL LOW (ref >60.00)
Glucose, Bld: 99 mg/dL (ref 70–99)
Potassium: 4.2 mEq/L (ref 3.5–5.1)
Sodium: 136 mEq/L (ref 135–145)
Total Bilirubin: 0.6 mg/dL (ref 0.2–1.2)
Total Protein: 7.1 g/dL (ref 6.0–8.3)

## 2020-11-17 LAB — HEMOGLOBIN A1C: Hgb A1c MFr Bld: 5.3 % (ref 4.6–6.5)

## 2020-11-23 ENCOUNTER — Telehealth (INDEPENDENT_AMBULATORY_CARE_PROVIDER_SITE_OTHER): Payer: Medicare Other | Admitting: Internal Medicine

## 2020-11-23 ENCOUNTER — Telehealth: Payer: Self-pay | Admitting: Internal Medicine

## 2020-11-23 DIAGNOSIS — M79604 Pain in right leg: Secondary | ICD-10-CM

## 2020-11-23 MED ORDER — HYDROCODONE-IBUPROFEN 5-200 MG PO TABS: 1.0000 | ORAL_TABLET | Freq: Four times a day (QID) | ORAL | 0 refills | Status: DC | PRN

## 2020-11-23 NOTE — Progress Notes (Addendum)
Virtual Visit via Video Note  I connected with Dawn Howard on 11/23/20 at 11:00 AM EDT by a video enabled telemedicine application and verified that I am speaking with the correct person using two identifiers.   I discussed the limitations of evaluation and management by telemedicine and the availability of in person appointments. The patient expressed understanding and agreed to proceed.  I was located at our [facility] office. The patient was at home. There was no one else present in the visit.   History of Present Illness:  C/o R leg pain due to a right hamstring strain - 10/10 after a fall. Ortho evaluation pending   Observations/Objective: The patient appears to be in no acute distress, looks ok  Assessment and Plan:  See my Assessment and Plan. Follow Up Instructions:    I discussed the assessment and treatment plan with the patient. The patient was provided an opportunity to ask questions and all were answered. The patient agreed with the plan and demonstrated an understanding of the instructions.   The patient was advised to call back or seek an in-person evaluation if the symptoms worsen or if the condition fails to improve as anticipated.  I provided face-to-face time during this encounter. We were at different locations.   Walker Kehr, MD

## 2020-11-25 DIAGNOSIS — S76311A Strain of muscle, fascia and tendon of the posterior muscle group at thigh level, right thigh, initial encounter: Secondary | ICD-10-CM | POA: Diagnosis not present

## 2020-11-25 NOTE — Telephone Encounter (Signed)
Patient reports [facility] does not have medication in stock. She is going to Ortho specialist today. She will ask that provider to prescribe alternative medication

## 2020-11-27 NOTE — Telephone Encounter (Signed)
Noted.  Okay.  Thanks

## 2020-11-30 ENCOUNTER — Telehealth: Payer: Self-pay | Admitting: *Deleted

## 2020-11-30 NOTE — Telephone Encounter (Signed)
Duplicate msg... see previous phone msg MD sent in new script fr different strength.Marland Kitchenlmb

## 2020-11-30 NOTE — Telephone Encounter (Signed)
-----   Message from Marguarite Arbour sent at 11/26/2020  2:46 PM EDT ----- Refill request

## 2020-12-06 ENCOUNTER — Encounter: Payer: Self-pay | Admitting: Internal Medicine

## 2020-12-06 DIAGNOSIS — M79606 Pain in leg, unspecified: Secondary | ICD-10-CM | POA: Insufficient documentation

## 2020-12-06 DIAGNOSIS — M25552 Pain in left hip: Secondary | ICD-10-CM | POA: Insufficient documentation

## 2020-12-06 NOTE — Assessment & Plan Note (Addendum)
Right leg strain.  Vicoprofen prescribed  Potential benefits of a short or long term opioids use as well as potential risks (i.e. addiction risk, apnea etc) and complications (i.e. Somnolence, constipation and others) were explained to the patient and were aknowledged. Follow-up with orthopedic surgery

## 2020-12-16 ENCOUNTER — Telehealth: Payer: Self-pay | Admitting: Internal Medicine

## 2020-12-16 NOTE — Telephone Encounter (Signed)
Patient COVID+ (at home test) 08.08.22  No symptoms other than constant cough  Says provider has prescribed HYDROcodone-homatropine (HYCODAN) 5-1.5 MG/5ML syrup in the past & wants to know if he can prescribe again to help w/ the cough  Pharmacy:  [place], [place] Phone:  [phone]  Fax:  [phone]

## 2020-12-17 MED ORDER — HYDROCODONE BIT-HOMATROP MBR 5-1.5 MG/5ML PO SOLN: 5.0000 mL | Freq: Three times a day (TID) | ORAL | 0 refills | Status: DC | PRN

## 2020-12-17 NOTE — Telephone Encounter (Signed)
OK VOV if problems Thx

## 2021-01-17 ENCOUNTER — Ambulatory Visit (INDEPENDENT_AMBULATORY_CARE_PROVIDER_SITE_OTHER): Payer: Medicare Other | Admitting: Internal Medicine

## 2021-01-17 ENCOUNTER — Other Ambulatory Visit: Payer: Self-pay

## 2021-01-17 ENCOUNTER — Encounter: Payer: Self-pay | Admitting: Internal Medicine

## 2021-01-17 VITALS — BP 120/72 | HR 77 | Temp 98.8°F | Ht 63.0 in | Wt 177.4 lb

## 2021-01-17 DIAGNOSIS — I10 Essential (primary) hypertension: Secondary | ICD-10-CM | POA: Diagnosis not present

## 2021-01-17 DIAGNOSIS — F419 Anxiety disorder, unspecified: Secondary | ICD-10-CM

## 2021-01-17 DIAGNOSIS — M8949 Other hypertrophic osteoarthropathy, multiple sites: Secondary | ICD-10-CM

## 2021-01-17 DIAGNOSIS — U071 COVID-19: Secondary | ICD-10-CM

## 2021-01-17 DIAGNOSIS — N182 Chronic kidney disease, stage 2 (mild): Secondary | ICD-10-CM | POA: Diagnosis not present

## 2021-01-17 DIAGNOSIS — M159 Polyosteoarthritis, unspecified: Secondary | ICD-10-CM

## 2021-01-17 DIAGNOSIS — Z8616 Personal history of COVID-19: Secondary | ICD-10-CM | POA: Insufficient documentation

## 2021-01-17 DIAGNOSIS — Z23 Encounter for immunization: Secondary | ICD-10-CM

## 2021-01-17 DIAGNOSIS — E559 Vitamin D deficiency, unspecified: Secondary | ICD-10-CM | POA: Diagnosis not present

## 2021-01-17 DIAGNOSIS — M5440 Lumbago with sciatica, unspecified side: Secondary | ICD-10-CM | POA: Diagnosis not present

## 2021-01-17 DIAGNOSIS — D751 Secondary polycythemia: Secondary | ICD-10-CM | POA: Diagnosis not present

## 2021-01-17 DIAGNOSIS — M109 Gout, unspecified: Secondary | ICD-10-CM | POA: Diagnosis not present

## 2021-01-17 DIAGNOSIS — G8929 Other chronic pain: Secondary | ICD-10-CM | POA: Diagnosis not present

## 2021-01-17 LAB — BASIC METABOLIC PANEL
BUN: 23 mg/dL (ref 6–23)
CO2: 24 mEq/L (ref 19–32)
Calcium: 10.1 mg/dL (ref 8.4–10.5)
Chloride: 105 mEq/L (ref 96–112)
Creatinine, Ser: 1.24 mg/dL — ABNORMAL HIGH (ref 0.40–1.20)
GFR: 43.49 mL/min — ABNORMAL LOW (ref >60.00)
Glucose, Bld: 90 mg/dL (ref 70–99)
Potassium: 4.3 mEq/L (ref 3.5–5.1)
Sodium: 138 mEq/L (ref 135–145)

## 2021-01-17 LAB — CBC WITH DIFFERENTIAL/PLATELET
Basophils Absolute: 0.1 10*3/uL (ref 0.0–0.1)
Basophils Relative: 0.6 % (ref 0.0–3.0)
Eosinophils Absolute: 0.2 10*3/uL (ref 0.0–0.7)
Eosinophils Relative: 2.5 % (ref 0.0–5.0)
HCT: 42.9 % (ref 36.0–46.0)
Hemoglobin: 14.4 g/dL (ref 12.0–15.0)
Lymphocytes Relative: 22.2 % (ref 12.0–46.0)
Lymphs Abs: 1.8 10*3/uL (ref 0.7–4.0)
MCHC: 33.5 g/dL (ref 30.0–36.0)
MCV: 90 fl (ref 78.0–100.0)
Monocytes Absolute: 0.7 10*3/uL (ref 0.1–1.0)
Monocytes Relative: 8 % (ref 3.0–12.0)
Neutro Abs: 5.4 10*3/uL (ref 1.4–7.7)
Neutrophils Relative %: 66.7 % (ref 43.0–77.0)
Platelets: 264 10*3/uL (ref 150.0–400.0)
RBC: 4.77 Mil/uL (ref 3.87–5.11)
RDW: 13.5 % (ref 11.5–15.5)
WBC: 8.1 10*3/uL (ref 4.0–10.5)

## 2021-01-17 MED ORDER — CARVEDILOL 12.5 MG PO TABS: ORAL_TABLET | ORAL | 3 refills | Status: DC

## 2021-01-17 MED ORDER — SPIRONOLACTONE 50 MG PO TABS: 50.0000 mg | ORAL_TABLET | Freq: Every day | ORAL | 3 refills | Status: DC

## 2021-01-17 MED ORDER — ALLOPURINOL 100 MG PO TABS: 50.0000 mg | ORAL_TABLET | Freq: Every day | ORAL | 3 refills | Status: DC

## 2021-01-17 NOTE — Assessment & Plan Note (Signed)
No OSA - nl sleep test S/p eval by Dr Perlov 2018 - rec observation Cont w/observation

## 2021-01-17 NOTE — Assessment & Plan Note (Signed)
Hydrate well 

## 2021-01-17 NOTE — Assessment & Plan Note (Addendum)
Pt recovered  We discussed COVID 19 booster

## 2021-01-17 NOTE — Assessment & Plan Note (Signed)
On Vit D 

## 2021-01-17 NOTE — Assessment & Plan Note (Signed)
Cont on allopurinol

## 2021-01-17 NOTE — Assessment & Plan Note (Signed)
F/u w/Dr Nelva Bush

## 2021-01-17 NOTE — Progress Notes (Addendum)
Subjective:  Patient ID: Dawn Howard, female    DOB: 29-Nov-1950  Age: 70 y.o. MRN: [id number]  CC: Follow-up (2 month f/u)   HPI Dawn Howard presents for gout, HTN, OA, Vit D def, recent COVID  Outpatient Medications Prior to Visit  Medication Sig Dispense Refill   b complex vitamins tablet Take 1 tablet by mouth daily. 100 tablet 0   furosemide (LASIX) 20 MG tablet Take 1-2 tablets (20-40 mg total) by mouth daily as needed for edema. 30 tablet 3   HYDROcodone-acetaminophen (NORCO) 7.5-325 MG tablet Take 1 tablet by mouth 2 (two) times daily as needed. Take 1 by twice a day as needed     MEGARED OMEGA-3 KRILL OIL PO Take by mouth.     Multiple Vitamin (MULITIVITAMIN WITH MINERALS) TABS Take 1 tablet by mouth daily.     pantoprazole (PROTONIX) 40 MG tablet Take 1 tablet (40 mg total) by mouth daily. 90 tablet 0   allopurinol (ZYLOPRIM) 100 MG tablet Take 0.5 tablets (50 mg total) by mouth daily. 90 tablet 3   carvedilol (COREG) 12.5 MG tablet TAKE  (1)  TABLET TWICE A DAY WITH MEALS (BREAKFAST AND SUPPER) 180 tablet 3   spironolactone (ALDACTONE) 50 MG tablet TAKE 1 TABLET ONCE DAILY. 90 tablet 3   HYDROcodone bit-homatropine (HYCODAN) 5-1.5 MG/5ML syrup Take 5 mLs by mouth every 8 (eight) hours as needed for cough. (Patient not taking: Reported on 01/17/2021) 120 mL 0   No facility-administered medications prior to visit.    ROS: Review of Systems  Constitutional:  Negative for activity change, appetite change, chills, fatigue and unexpected weight change.  HENT:  Negative for congestion, mouth sores and sinus pressure.   Eyes:  Negative for visual disturbance.  Respiratory:  Negative for cough and chest tightness.   Gastrointestinal:  Negative for abdominal pain and nausea.  Genitourinary:  Negative for difficulty urinating, frequency and vaginal pain.  Musculoskeletal:  Negative for back pain and gait problem.  Skin:  Negative for pallor and rash.  Neurological:   Negative for dizziness, tremors, weakness, numbness and headaches.  Psychiatric/Behavioral:  Negative for confusion and sleep disturbance.    Objective:  BP 120/72 (BP Location: Left Arm)   Pulse 77   Temp 98.8 F (37.1 C) (Oral)   Ht '5\' 3"'$  (1.6 m)   Wt 177 lb 6.4 oz (80.5 kg)   SpO2 96%   BMI 31.42 kg/m   BP Readings from Last 3 Encounters:  01/17/21 120/72  08/24/20 120/72  05/13/20 113/67    Wt Readings from Last 3 Encounters:  01/17/21 177 lb 6.4 oz (80.5 kg)  08/24/20 174 lb 3.2 oz (79 kg)  05/13/20 163 lb (73.9 kg)    Physical Exam Constitutional:      General: She is not in acute distress.    Appearance: She is well-developed.  HENT:     Head: Normocephalic.     Right Ear: External ear normal.     Left Ear: External ear normal.     Nose: Nose normal.  Eyes:     General:        Right eye: No discharge.        Left eye: No discharge.     Conjunctiva/sclera: Conjunctivae normal.     Pupils: Pupils are equal, round, and reactive to light.  Neck:     Thyroid: No thyromegaly.     Vascular: No JVD.     Trachea: No tracheal deviation.  Cardiovascular:  Rate and Rhythm: Normal rate and regular rhythm.     Heart sounds: Normal heart sounds.  Pulmonary:     Effort: No respiratory distress.     Breath sounds: No stridor. No wheezing.  Abdominal:     General: Bowel sounds are normal. There is no distension.     Palpations: Abdomen is soft. There is no mass.     Tenderness: There is no abdominal tenderness. There is no guarding or rebound.  Musculoskeletal:        General: No tenderness.     Cervical back: Normal range of motion and neck supple. No rigidity.  Lymphadenopathy:     Cervical: No cervical adenopathy.  Skin:    Findings: No erythema or rash.  Neurological:     Mental Status: She is oriented to person, place, and time.     Cranial Nerves: No cranial nerve deficit.     Motor: No abnormal muscle tone.     Coordination: Coordination normal.      Deep Tendon Reflexes: Reflexes normal.  Psychiatric:        Behavior: Behavior normal.        Thought Content: Thought content normal.        Judgment: Judgment normal.    Lab Results  Component Value Date   WBC 8.1 01/17/2021   HGB 14.4 01/17/2021   HCT 42.9 01/17/2021   PLT 264.0 01/17/2021   GLUCOSE 90 01/17/2021   CHOL 200 07/18/2018   TRIG 185.0 (H) 07/18/2018   HDL 39.70 07/18/2018   LDLDIRECT 160.0 01/02/2018   LDLCALC 124 (H) 07/18/2018   ALT 27 11/17/2020   AST 19 11/17/2020   NA 138 01/17/2021   K 4.3 01/17/2021   CL 105 01/17/2021   CREATININE 1.24 (H) 01/17/2021   BUN 23 01/17/2021   CO2 24 01/17/2021   TSH 1.58 12/11/2019   HGBA1C 5.3 11/17/2020    US Abdomen Complete  Result Date: 12/24/2019 CLINICAL DATA:  Intermittent left upper quadrant pain. EXAM: ABDOMEN ULTRASOUND COMPLETE COMPARISON:  Abdominal ultrasound dated February 18, 2014. FINDINGS: Gallbladder: No gallstones or wall thickening visualized. No sonographic Murphy sign noted by sonographer. Common bile duct: Diameter: 4 mm, normal. Liver: Unchanged 2.8 cm simple cyst in the right liver. 1.2 cm simple cyst in the left liver, not seen on the prior study. Patchy increased parenchymal echogenicity. Portal vein is patent on color Doppler imaging with normal direction of blood flow towards the liver. IVC: No abnormality visualized. Pancreas: Visualized portion unremarkable. Spleen: Size and appearance within normal limits. Right Kidney: Length: 10.1 cm. Echogenicity within normal limits. No mass or hydronephrosis visualized. 1.7 cm simple cyst. Left Kidney: Length: 10.4 cm. Echogenicity within normal limits. No mass or hydronephrosis visualized. 1.5 cm simple cyst. Abdominal aorta: No aneurysm visualized. Other findings: None. IMPRESSION: 1. No acute abnormality. 2. Patchy increased parenchymal echogenicity of the liver, nonspecific but could represent steatosis or underlying hepatocellular disease. Correlate with  LFTs. 3. SImple hepatic and bilateral renal cysts. Electronically Signed   By: Titus Dubin M.D.   On: 12/24/2019 16:40    Assessment & Plan:     Walker Kehr, MD

## 2021-01-17 NOTE — Patient Instructions (Addendum)
COVID 19 (+) on 12/13/20  Try trekking poles
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
## 2021-01-17 NOTE — Assessment & Plan Note (Signed)
Cont on Coreg, Spironolactone

## 2021-01-17 NOTE — Assessment & Plan Note (Signed)
Norco prn - Dr Nelva Bush  Potential benefits of a long term opioids use as well as potential risks (i.e. addiction risk, apnea etc) and complications (i.e. Somnolence, constipation and others) were explained to the patient and were aknowledged.

## 2021-01-19 ENCOUNTER — Telehealth: Payer: Self-pay | Admitting: Internal Medicine

## 2021-01-19 NOTE — Telephone Encounter (Signed)
Patient calling back after reviewing provider MyChart message  Patient says she is okay w/ trying the new med Dawn Howard   Patient called her insurance & they advised her that a PA would be needed for medication  Insurance: [place] [phone]

## 2021-01-20 NOTE — Telephone Encounter (Signed)
Notified pt we will do PA once we receive and I will give her a call back with approval status.Marland KitchenJohny Chess

## 2021-01-20 NOTE — Telephone Encounter (Signed)
Okay.  It may not be covered then. We will wait on Kerendia.  Thanks

## 2021-01-21 DIAGNOSIS — M5136 Other intervertebral disc degeneration, lumbar region: Secondary | ICD-10-CM | POA: Diagnosis not present

## 2021-01-21 DIAGNOSIS — M5459 Other low back pain: Secondary | ICD-10-CM | POA: Diagnosis not present

## 2021-01-21 DIAGNOSIS — Z79891 Long term (current) use of opiate analgesic: Secondary | ICD-10-CM | POA: Diagnosis not present

## 2021-01-21 DIAGNOSIS — M542 Cervicalgia: Secondary | ICD-10-CM | POA: Diagnosis not present

## 2021-01-21 DIAGNOSIS — M5030 Other cervical disc degeneration, unspecified cervical region: Secondary | ICD-10-CM | POA: Diagnosis not present

## 2021-01-27 MED ORDER — KERENDIA 10 MG PO TABS: 1.0000 | ORAL_TABLET | Freq: Every day | ORAL | 5 refills | Status: DC

## 2021-01-27 NOTE — Telephone Encounter (Signed)
Called pt inform her we never received PA. Verified in chart MD did not send script once she agreed to start. Inform pt will send rx to [facility], and I can start PA once we received.Marland KitchenJohny Chess

## 2021-01-27 NOTE — Telephone Encounter (Signed)
Patient is following up to find out status of prior auth and when she can pick up this medicine.  Please follow-up with the patient at [phone]

## 2021-01-27 NOTE — Telephone Encounter (Signed)
Submitted PA vis cover-my-meds for Kerendia 10 mg w/ Key# [id number]. Rec'd msg stating received and can take up to 5 days to hear back.Marland KitchenJohny Chess

## 2021-02-01 NOTE — Telephone Encounter (Signed)
New PA has been submitted by pts pharmacy as the old has expired. I was able to complete PA as the pts insurance plan called asking we complete PA due topts concern for tx of her Kidney Disease.  **New KEY:  [id number]  **Status:Approved Review Type:Prior Auth Coverage Start Date:01/02/2021 Coverage End Date:02/01/2022

## 2021-02-02 NOTE — Telephone Encounter (Signed)
Patient says pharmacy contacted her & advised PA had been approved but her OOP monthly will be about $500  Patient says she is on a fixed income & can not afford to pay that amount monthly  Wants to know if there is an alternative provider can prescribe that is less expensive  Please fu w/ patient

## 2021-02-03 NOTE — Telephone Encounter (Signed)
Noted.  Okay not to take this medication

## 2021-02-03 NOTE — Addendum Note (Signed)
Addended by: Cassandria Anger on: 02/03/2021 07:38 AM   Modules accepted: Orders

## 2021-03-09 DIAGNOSIS — H04123 Dry eye syndrome of bilateral lacrimal glands: Secondary | ICD-10-CM | POA: Diagnosis not present

## 2021-03-09 DIAGNOSIS — H0102A Squamous blepharitis right eye, upper and lower eyelids: Secondary | ICD-10-CM | POA: Diagnosis not present

## 2021-03-09 DIAGNOSIS — H0102B Squamous blepharitis left eye, upper and lower eyelids: Secondary | ICD-10-CM | POA: Diagnosis not present

## 2021-03-09 DIAGNOSIS — H43813 Vitreous degeneration, bilateral: Secondary | ICD-10-CM | POA: Diagnosis not present

## 2021-03-09 DIAGNOSIS — Z961 Presence of intraocular lens: Secondary | ICD-10-CM | POA: Diagnosis not present

## 2021-03-09 DIAGNOSIS — H17821 Peripheral opacity of cornea, right eye: Secondary | ICD-10-CM | POA: Diagnosis not present

## 2021-03-09 DIAGNOSIS — H16141 Punctate keratitis, right eye: Secondary | ICD-10-CM | POA: Diagnosis not present

## 2021-04-18 ENCOUNTER — Other Ambulatory Visit: Payer: Self-pay

## 2021-04-18 ENCOUNTER — Encounter: Payer: Self-pay | Admitting: Internal Medicine

## 2021-04-18 ENCOUNTER — Ambulatory Visit (INDEPENDENT_AMBULATORY_CARE_PROVIDER_SITE_OTHER): Payer: Medicare Other | Admitting: Internal Medicine

## 2021-04-18 DIAGNOSIS — F419 Anxiety disorder, unspecified: Secondary | ICD-10-CM

## 2021-04-18 DIAGNOSIS — D751 Secondary polycythemia: Secondary | ICD-10-CM

## 2021-04-18 DIAGNOSIS — F4321 Adjustment disorder with depressed mood: Secondary | ICD-10-CM

## 2021-04-18 DIAGNOSIS — N182 Chronic kidney disease, stage 2 (mild): Secondary | ICD-10-CM | POA: Diagnosis not present

## 2021-04-18 DIAGNOSIS — M109 Gout, unspecified: Secondary | ICD-10-CM | POA: Diagnosis not present

## 2021-04-18 DIAGNOSIS — E559 Vitamin D deficiency, unspecified: Secondary | ICD-10-CM | POA: Diagnosis not present

## 2021-04-18 DIAGNOSIS — R109 Unspecified abdominal pain: Secondary | ICD-10-CM | POA: Diagnosis not present

## 2021-04-18 LAB — COMPREHENSIVE METABOLIC PANEL
ALT: 26 U/L (ref 0–35)
AST: 20 U/L (ref 0–37)
Albumin: 4.1 g/dL (ref 3.5–5.2)
Alkaline Phosphatase: 97 U/L (ref 39–117)
BUN: 21 mg/dL (ref 6–23)
CO2: 26 mEq/L (ref 19–32)
Calcium: 10.2 mg/dL (ref 8.4–10.5)
Chloride: 103 mEq/L (ref 96–112)
Creatinine, Ser: 1.08 mg/dL (ref 0.40–1.20)
GFR: 51.24 mL/min — ABNORMAL LOW (ref >60.00)
Glucose, Bld: 86 mg/dL (ref 70–99)
Potassium: 4.1 mEq/L (ref 3.5–5.1)
Sodium: 137 mEq/L (ref 135–145)
Total Bilirubin: 0.6 mg/dL (ref 0.2–1.2)
Total Protein: 7 g/dL (ref 6.0–8.3)

## 2021-04-18 MED ORDER — PANTOPRAZOLE SODIUM 40 MG PO TBEC: 40.0000 mg | DELAYED_RELEASE_TABLET | Freq: Every day | ORAL | 3 refills | Status: DC
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
## 2021-04-18 NOTE — Assessment & Plan Note (Signed)
On Vit D 

## 2021-04-18 NOTE — Assessment & Plan Note (Signed)
On allopurinol No relapse

## 2021-04-18 NOTE — Assessment & Plan Note (Signed)
Check CBC 

## 2021-04-18 NOTE — Assessment & Plan Note (Addendum)
No NSAIDs GFR 43 CRI stage 2-3; Asa Lente was too $$$ Kadisha is taking a low dose Ibuprofen prn - she understands risks CT Coronary calcium score of 0 --- 2020
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
## 2021-05-05 DIAGNOSIS — M5136 Other intervertebral disc degeneration, lumbar region: Secondary | ICD-10-CM | POA: Diagnosis not present

## 2021-05-05 DIAGNOSIS — M5459 Other low back pain: Secondary | ICD-10-CM | POA: Diagnosis not present

## 2021-05-05 DIAGNOSIS — M542 Cervicalgia: Secondary | ICD-10-CM | POA: Diagnosis not present

## 2021-05-05 DIAGNOSIS — Z79891 Long term (current) use of opiate analgesic: Secondary | ICD-10-CM | POA: Diagnosis not present

## 2021-05-05 DIAGNOSIS — M5030 Other cervical disc degeneration, unspecified cervical region: Secondary | ICD-10-CM | POA: Diagnosis not present

## 2021-05-25 ENCOUNTER — Other Ambulatory Visit: Payer: Self-pay

## 2021-05-25 ENCOUNTER — Encounter: Payer: Self-pay | Admitting: Internal Medicine

## 2021-05-25 ENCOUNTER — Ambulatory Visit (INDEPENDENT_AMBULATORY_CARE_PROVIDER_SITE_OTHER): Payer: Medicare Other | Admitting: Internal Medicine

## 2021-05-25 DIAGNOSIS — J069 Acute upper respiratory infection, unspecified: Secondary | ICD-10-CM

## 2021-05-25 MED ORDER — CEFDINIR 300 MG PO CAPS: 300.0000 mg | ORAL_CAPSULE | Freq: Two times a day (BID) | ORAL | 0 refills | Status: DC

## 2021-05-25 NOTE — Patient Instructions (Signed)
You can use over-the-counter  "cold" medicines  such as "Tylenol cold" , "Advil cold",  "Mucinex" or" Mucinex D"  for cough and congestion.   Avoid decongestants if you have high blood pressure and use "Afrin" nasal spray for nasal congestion as directed. Use " Delsym" or" Robitussin" cough syrup varietis for cough.  You can use plain "Tylenol" or "Advil" for fever, chills and achyness. Use Halls or Ricola cough drops.   "Common cold" symptoms are usually triggered by a virus.  The antibiotics are usually not necessary. On average, a" viral cold" illness would take 4-7 days to resolve.

## 2021-05-25 NOTE — Assessment & Plan Note (Signed)
Take Cefuroxime if worse

## 2021-05-25 NOTE — Progress Notes (Signed)
Subjective:  Patient ID: Dawn Howard, female    DOB: 02/26/1951  Age: 71 y.o. MRN: [id number]  CC: Office Visit (Fatigue, congestion, ear pain, sore throat, negative COVID test 05/25/21)   HPI Dawn Howard presents for not feeling well since yesterday - tired, chills, R ear hurts, ST.  COVD test was (-)  Outpatient Medications Prior to Visit  Medication Sig Dispense Refill   allopurinol (ZYLOPRIM) 100 MG tablet Take 0.5 tablets (50 mg total) by mouth daily. 90 tablet 3   b complex vitamins tablet Take 1 tablet by mouth daily. 100 tablet 0   carvedilol (COREG) 12.5 MG tablet TAKE  (1)  TABLET TWICE A DAY WITH MEALS (BREAKFAST AND SUPPER) 180 tablet 3   furosemide (LASIX) 20 MG tablet Take 1-2 tablets (20-40 mg total) by mouth daily as needed for edema. 30 tablet 3   HYDROcodone-acetaminophen (NORCO) 7.5-325 MG tablet Take 1 tablet by mouth 2 (two) times daily as needed. Take 1 by twice a day as needed     MEGARED OMEGA-3 KRILL OIL PO Take by mouth.     Multiple Vitamin (MULITIVITAMIN WITH MINERALS) TABS Take 1 tablet by mouth daily.     pantoprazole (PROTONIX) 40 MG tablet Take 1 tablet (40 mg total) by mouth daily. 90 tablet 3   spironolactone (ALDACTONE) 50 MG tablet Take 1 tablet (50 mg total) by mouth daily. 90 tablet 3   No facility-administered medications prior to visit.    ROS: Review of Systems  Constitutional:  Positive for chills and fatigue. Negative for activity change, appetite change and unexpected weight change.  HENT:  Positive for ear pain, rhinorrhea, sinus pain and sore throat. Negative for congestion, mouth sores and sinus pressure.   Eyes:  Negative for visual disturbance.  Respiratory:  Positive for cough. Negative for chest tightness, shortness of breath and wheezing.   Gastrointestinal:  Negative for abdominal pain and nausea.  Genitourinary:  Negative for difficulty urinating, frequency and vaginal pain.  Musculoskeletal:  Negative for back pain and  gait problem.  Skin:  Negative for pallor and rash.  Neurological:  Negative for dizziness, tremors, weakness, numbness and headaches.  Psychiatric/Behavioral:  Negative for confusion and sleep disturbance.    Objective:  BP 112/68 (BP Location: Left Arm, Patient Position: Sitting, Cuff Size: Normal)    Pulse 80    Temp 98.1 F (36.7 C) (Oral)    Ht 5\' 3"  (1.6 m)    Wt 173 lb (78.5 kg)    SpO2 97%    BMI 30.65 kg/m   BP Readings from Last 3 Encounters:  05/25/21 112/68  04/18/21 130/90  01/17/21 120/72    Wt Readings from Last 3 Encounters:  05/25/21 173 lb (78.5 kg)  04/18/21 171 lb 12.8 oz (77.9 kg)  01/17/21 177 lb 6.4 oz (80.5 kg)    Physical Exam Constitutional:      General: She is not in acute distress.    Appearance: She is well-developed. She is not ill-appearing.  HENT:     Head: Normocephalic.     Right Ear: External ear normal.     Left Ear: External ear normal.     Nose: Congestion and rhinorrhea present.     Mouth/Throat:     Pharynx: Posterior oropharyngeal erythema present.  Eyes:     General:        Right eye: No discharge.        Left eye: No discharge.     Conjunctiva/sclera: Conjunctivae  normal.     Pupils: Pupils are equal, round, and reactive to light.  Neck:     Thyroid: No thyromegaly.     Vascular: No JVD.     Trachea: No tracheal deviation.  Cardiovascular:     Rate and Rhythm: Normal rate and regular rhythm.     Heart sounds: Normal heart sounds.  Pulmonary:     Effort: No respiratory distress.     Breath sounds: No stridor. No wheezing.  Abdominal:     General: Bowel sounds are normal. There is no distension.     Palpations: Abdomen is soft. There is no mass.     Tenderness: There is no abdominal tenderness. There is no guarding or rebound.  Musculoskeletal:        General: No tenderness.     Cervical back: Normal range of motion and neck supple. No rigidity.  Lymphadenopathy:     Cervical: No cervical adenopathy.  Skin:     Findings: No erythema or rash.  Neurological:     Cranial Nerves: No cranial nerve deficit.     Motor: No abnormal muscle tone.     Coordination: Coordination normal.     Deep Tendon Reflexes: Reflexes normal.  Psychiatric:        Behavior: Behavior normal.        Thought Content: Thought content normal.        Judgment: Judgment normal.  Fluid behind B TMs  Lab Results  Component Value Date   WBC 8.1 01/17/2021   HGB 14.4 01/17/2021   HCT 42.9 01/17/2021   PLT 264.0 01/17/2021   GLUCOSE 86 04/18/2021   CHOL 200 07/18/2018   TRIG 185.0 (H) 07/18/2018   HDL 39.70 07/18/2018   LDLDIRECT 160.0 01/02/2018   LDLCALC 124 (H) 07/18/2018   ALT 26 04/18/2021   AST 20 04/18/2021   NA 137 04/18/2021   K 4.1 04/18/2021   CL 103 04/18/2021   CREATININE 1.08 04/18/2021   BUN 21 04/18/2021   CO2 26 04/18/2021   TSH 1.58 12/11/2019   HGBA1C 5.3 11/17/2020    US Abdomen Complete  Result Date: 12/24/2019 CLINICAL DATA:  Intermittent left upper quadrant pain. EXAM: ABDOMEN ULTRASOUND COMPLETE COMPARISON:  Abdominal ultrasound dated February 18, 2014. FINDINGS: Gallbladder: No gallstones or wall thickening visualized. No sonographic Murphy sign noted by sonographer. Common bile duct: Diameter: 4 mm, normal. Liver: Unchanged 2.8 cm simple cyst in the right liver. 1.2 cm simple cyst in the left liver, not seen on the prior study. Patchy increased parenchymal echogenicity. Portal vein is patent on color Doppler imaging with normal direction of blood flow towards the liver. IVC: No abnormality visualized. Pancreas: Visualized portion unremarkable. Spleen: Size and appearance within normal limits. Right Kidney: Length: 10.1 cm. Echogenicity within normal limits. No mass or hydronephrosis visualized. 1.7 cm simple cyst. Left Kidney: Length: 10.4 cm. Echogenicity within normal limits. No mass or hydronephrosis visualized. 1.5 cm simple cyst. Abdominal aorta: No aneurysm visualized. Other findings: None.  IMPRESSION: 1. No acute abnormality. 2. Patchy increased parenchymal echogenicity of the liver, nonspecific but could represent steatosis or underlying hepatocellular disease. Correlate with LFTs. 3. SImple hepatic and bilateral renal cysts. Electronically Signed   By: Titus Dubin M.D.   On: 12/24/2019 16:40    Assessment & Plan:   Problem List Items Addressed This Visit     Upper respiratory infection    Take Cefuroxime if worse      Relevant Medications   cefdinir (OMNICEF) 300  MG capsule      Meds ordered this encounter  Medications   cefdinir (OMNICEF) 300 MG capsule    Sig: Take 1 capsule (300 mg total) by mouth 2 (two) times daily.    Dispense:  20 capsule    Refill:  0      Follow-up: Return for a follow-up visit.  Walker Kehr, MD

## 2021-06-15 DIAGNOSIS — N1831 Chronic kidney disease, stage 3a: Secondary | ICD-10-CM | POA: Diagnosis not present

## 2021-07-11 DIAGNOSIS — N1831 Chronic kidney disease, stage 3a: Secondary | ICD-10-CM | POA: Diagnosis not present

## 2021-07-18 ENCOUNTER — Other Ambulatory Visit: Payer: Self-pay

## 2021-07-18 ENCOUNTER — Ambulatory Visit: Payer: Medicare Other

## 2021-07-18 ENCOUNTER — Encounter: Payer: Self-pay | Admitting: Internal Medicine

## 2021-07-18 ENCOUNTER — Ambulatory Visit (INDEPENDENT_AMBULATORY_CARE_PROVIDER_SITE_OTHER): Payer: Medicare Other

## 2021-07-18 ENCOUNTER — Ambulatory Visit (INDEPENDENT_AMBULATORY_CARE_PROVIDER_SITE_OTHER): Payer: Medicare Other | Admitting: Internal Medicine

## 2021-07-18 VITALS — BP 110/70 | HR 82 | Temp 97.8°F | Ht 63.0 in | Wt 174.0 lb

## 2021-07-18 DIAGNOSIS — E559 Vitamin D deficiency, unspecified: Secondary | ICD-10-CM

## 2021-07-18 DIAGNOSIS — M109 Gout, unspecified: Secondary | ICD-10-CM

## 2021-07-18 DIAGNOSIS — Z23 Encounter for immunization: Secondary | ICD-10-CM

## 2021-07-18 DIAGNOSIS — M5440 Lumbago with sciatica, unspecified side: Secondary | ICD-10-CM | POA: Diagnosis not present

## 2021-07-18 DIAGNOSIS — G8929 Other chronic pain: Secondary | ICD-10-CM | POA: Diagnosis not present

## 2021-07-18 DIAGNOSIS — Z0000 Encounter for general adult medical examination without abnormal findings: Secondary | ICD-10-CM

## 2021-07-18 DIAGNOSIS — N182 Chronic kidney disease, stage 2 (mild): Secondary | ICD-10-CM | POA: Diagnosis not present

## 2021-07-18 NOTE — Assessment & Plan Note (Signed)
Cont on allopurinol ?

## 2021-07-18 NOTE — Patient Instructions (Signed)
Dawn Howard , Thank you for taking time to come for your Medicare Wellness Visit. I appreciate your ongoing commitment to your health goals. Please review the following plan we discussed and let me know if I can assist you in the future.   Screening recommendations/referrals: Colonoscopy: 05/13/2020; due every 3 years Mammogram: 06/04/2018; due every 1-2 years Bone Density: 05/30/2018; due every 2 years Recommended yearly ophthalmology/optometry visit for glaucoma screening and checkup Recommended yearly dental visit for hygiene and checkup  Vaccinations: Influenza vaccine: 01/17/2021 Pneumococcal vaccine: 03/31/2014, 02/14/2016 Tdap vaccine: 09/27/2021; due every 10 years Shingles vaccine: never done   Covid-19: 07/06/2019, 08/05/2019, 03/21/2020  Advanced directives: Advance directive discussed with you today. Even though you declined this today please call our office should you change your mind and we can give you the proper paperwork for you to fill out.  Conditions/risks identified: Yes; Client understands the importance of follow-up appointments with providers by attending scheduled visits and discussed goals to eat healthier, increase physical activity 5 times a week for 30 minutes each, exercise the brain by doing stimulating brain exercises (reading, adult coloring, crafting, listening to music, puzzles, etc.), socialize and enjoy life more, get enough sleep at least 8-9 hours average per night and make time for laughter.  Next appointment: Please schedule your next Medicare Wellness Visit with your Nurse Health Advisor in 1 year by calling [phone].   Preventive Care 19 Years and Older, Female Preventive care refers to lifestyle choices and visits with your health care provider that can promote health and wellness. What does preventive care include? A yearly physical exam. This is also called an annual well check. Dental exams once or twice a year. Routine eye exams. Ask your  health care provider how often you should have your eyes checked. Personal lifestyle choices, including: Daily care of your teeth and gums. Regular physical activity. Eating a healthy diet. Avoiding tobacco and drug use. Limiting alcohol use. Practicing safe sex. Taking low-dose aspirin every day. Taking vitamin and mineral supplements as recommended by your health care provider. What happens during an annual well check? The services and screenings done by your health care provider during your annual well check will depend on your age, overall health, lifestyle risk factors, and family history of disease. Counseling  Your health care provider may ask you questions about your: Alcohol use. Tobacco use. Drug use. Emotional well-being. Home and relationship well-being. Sexual activity. Eating habits. History of falls. Memory and ability to understand (cognition). Work and work Statistician. Reproductive health. Screening  You may have the following tests or measurements: Height, weight, and BMI. Blood pressure. Lipid and cholesterol levels. These may be checked every 5 years, or more frequently if you are over 44 years old. Skin check. Lung cancer screening. You may have this screening every year starting at age 58 if you have a 30-pack-year history of smoking and currently smoke or have quit within the past 15 years. Fecal occult blood test (FOBT) of the stool. You may have this test every year starting at age 30. Flexible sigmoidoscopy or colonoscopy. You may have a sigmoidoscopy every 5 years or a colonoscopy every 10 years starting at age 1. Hepatitis C blood test. Hepatitis B blood test. Sexually transmitted disease (STD) testing. Diabetes screening. This is done by checking your blood sugar (glucose) after you have not eaten for a while (fasting). You may have this done every 1-3 years. Bone density scan. This is done to screen for osteoporosis. You may  have this done  starting at age 35. Mammogram. This may be done every 1-2 years. Talk to your health care provider about how often you should have regular mammograms. Talk with your health care provider about your test results, treatment options, and if necessary, the need for more tests. Vaccines  Your health care provider may recommend certain vaccines, such as: Influenza vaccine. This is recommended every year. Tetanus, diphtheria, and acellular pertussis (Tdap, Td) vaccine. You may need a Td booster every 10 years. Zoster vaccine. You may need this after age 7. Pneumococcal 13-valent conjugate (PCV13) vaccine. One dose is recommended after age 73. Pneumococcal polysaccharide (PPSV23) vaccine. One dose is recommended after age 14. Talk to your health care provider about which screenings and vaccines you need and how often you need them. This information is not intended to replace advice given to you by your health care provider. Make sure you discuss any questions you have with your health care provider. Document Released: 05/21/2015 Document Revised: 01/12/2016 Document Reviewed: 02/23/2015 Elsevier Interactive Patient Education  2017 [employer] Prevention in the Home Falls can cause injuries. They can happen to people of all ages. There are many things you can do to make your home safe and to help prevent falls. What can I do on the outside of my home? Regularly fix the edges of walkways and driveways and fix any cracks. Remove anything that might make you trip as you walk through a door, such as a raised step or threshold. Trim any bushes or trees on the path to your home. Use bright outdoor lighting. Clear any walking paths of anything that might make someone trip, such as rocks or tools. Regularly check to see if handrails are loose or broken. Make sure that both sides of any steps have handrails. Any raised decks and porches should have guardrails on the edges. Have any leaves, snow, or  ice cleared regularly. Use sand or salt on walking paths during winter. Clean up any spills in your garage right away. This includes oil or grease spills. What can I do in the bathroom? Use night lights. Install grab bars by the toilet and in the tub and shower. Do not use towel bars as grab bars. Use non-skid mats or decals in the tub or shower. If you need to sit down in the shower, use a plastic, non-slip stool. Keep the floor dry. Clean up any water that spills on the floor as soon as it happens. Remove soap buildup in the tub or shower regularly. Attach bath mats securely with double-sided non-slip rug tape. Do not have throw rugs and other things on the floor that can make you trip. What can I do in the bedroom? Use night lights. Make sure that you have a light by your bed that is easy to reach. Do not use any sheets or blankets that are too big for your bed. They should not hang down onto the floor. Have a firm chair that has side arms. You can use this for support while you get dressed. Do not have throw rugs and other things on the floor that can make you trip. What can I do in the kitchen? Clean up any spills right away. Avoid walking on wet floors. Keep items that you use a lot in easy-to-reach places. If you need to reach something above you, use a strong step stool that has a grab bar. Keep electrical cords out of the way. Do not use floor polish  or wax that makes floors slippery. If you must use wax, use non-skid floor wax. Do not have throw rugs and other things on the floor that can make you trip. What can I do with my stairs? Do not leave any items on the stairs. Make sure that there are handrails on both sides of the stairs and use them. Fix handrails that are broken or loose. Make sure that handrails are as long as the stairways. Check any carpeting to make sure that it is firmly attached to the stairs. Fix any carpet that is loose or worn. Avoid having throw rugs at  the top or bottom of the stairs. If you do have throw rugs, attach them to the floor with carpet tape. Make sure that you have a light switch at the top of the stairs and the bottom of the stairs. If you do not have them, ask someone to add them for you. What else can I do to help prevent falls? Wear shoes that: Do not have high heels. Have rubber bottoms. Are comfortable and fit you well. Are closed at the toe. Do not wear sandals. If you use a stepladder: Make sure that it is fully opened. Do not climb a closed stepladder. Make sure that both sides of the stepladder are locked into place. Ask someone to hold it for you, if possible. Clearly mark and make sure that you can see: Any grab bars or handrails. First and last steps. Where the edge of each step is. Use tools that help you move around (mobility aids) if they are needed. These include: Canes. Walkers. Scooters. Crutches. Turn on the lights when you go into a dark area. Replace any light bulbs as soon as they burn out. Set up your furniture so you have a clear path. Avoid moving your furniture around. If any of your floors are uneven, fix them. If there are any pets around you, be aware of where they are. Review your medicines with your doctor. Some medicines can make you feel dizzy. This can increase your chance of falling. Ask your doctor what other things that you can do to help prevent falls. This information is not intended to replace advice given to you by your health care provider. Make sure you discuss any questions you have with your health care provider. Document Released: 02/18/2009 Document Revised: 09/30/2015 Document Reviewed: 05/29/2014 Elsevier Interactive Patient Education  2017 [employer].

## 2021-07-18 NOTE — Assessment & Plan Note (Signed)
Norco prn  Potential benefits of a long term opioids use as well as potential risks (i.e. addiction risk, apnea etc) and complications (i.e. Somnolence, constipation and others) were explained to the patient and were aknowledged. 
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
## 2021-07-18 NOTE — Assessment & Plan Note (Signed)
Pt saw Dr Justin Mend - he stopped vit D ?

## 2021-07-18 NOTE — Addendum Note (Signed)
Addended by: Marijean Heath R on: 07/18/2021 11:55 AM ? ? Modules accepted: Orders ? ?

## 2021-07-18 NOTE — Progress Notes (Signed)
? ?Subjective:  ?Patient ID: Dawn Howard, female    DOB: 1950/06/25  Age: 71 y.o. MRN: [id number] ? ?CC: No chief complaint on file. ? ? ?HPI ?[name] presents for LBP, gout, CRI ?Pt saw Dr Justin Mend - he stopped vit D ? ?Outpatient Medications Prior to Visit  ?Medication Sig Dispense Refill  ? allopurinol (ZYLOPRIM) 100 MG tablet Take 0.5 tablets (50 mg total) by mouth daily. 90 tablet 3  ? b complex vitamins tablet Take 1 tablet by mouth daily. 100 tablet 0  ? carvedilol (COREG) 12.5 MG tablet TAKE  (1)  TABLET TWICE A DAY WITH MEALS (BREAKFAST AND SUPPER) 180 tablet 3  ? furosemide (LASIX) 20 MG tablet Take 1-2 tablets (20-40 mg total) by mouth daily as needed for edema. 30 tablet 3  ? HYDROcodone-acetaminophen (NORCO) 7.5-325 MG tablet Take 1 tablet by mouth 2 (two) times daily as needed. Take 1 by twice a day as needed    ? MEGARED OMEGA-3 KRILL OIL PO Take by mouth.    ? Multiple Vitamin (MULITIVITAMIN WITH MINERALS) TABS Take 1 tablet by mouth daily.    ? pantoprazole (PROTONIX) 40 MG tablet Take 1 tablet (40 mg total) by mouth daily. 90 tablet 3  ? spironolactone (ALDACTONE) 50 MG tablet Take 1 tablet (50 mg total) by mouth daily. 90 tablet 3  ? cefdinir (OMNICEF) 300 MG capsule Take 1 capsule (300 mg total) by mouth 2 (two) times daily. (Patient not taking: Reported on 07/18/2021) 20 capsule 0  ? ?No facility-administered medications prior to visit.  ? ? ?ROS: ?Review of Systems  ?Constitutional:  Negative for activity change, appetite change, chills, fatigue and unexpected weight change.  ?HENT:  Negative for congestion, mouth sores and sinus pressure.   ?Eyes:  Negative for visual disturbance.  ?Respiratory:  Negative for cough and chest tightness.   ?Gastrointestinal:  Negative for abdominal pain and nausea.  ?Genitourinary:  Negative for difficulty urinating, frequency and vaginal pain.  ?Musculoskeletal:  Positive for back pain. Negative for gait problem.  ?Skin:  Negative for pallor and rash.   ?Neurological:  Negative for dizziness, tremors, weakness, numbness and headaches.  ?Psychiatric/Behavioral:  Negative for confusion and sleep disturbance.   ? ?Objective:  ?BP 110/70 (BP Location: Left Arm, Patient Position: Sitting, Cuff Size: Large)   Pulse 82   Temp 97.8 ?F (36.6 ?C) (Oral)   Ht '5\' 3"'$  (1.6 m)   Wt 174 lb (78.9 kg)   SpO2 98%   BMI 30.82 kg/m?  ? ?BP Readings from Last 3 Encounters:  ?07/18/21 110/70  ?07/18/21 110/70  ?05/25/21 112/68  ? ? ?Wt Readings from Last 3 Encounters:  ?07/18/21 174 lb (78.9 kg)  ?07/18/21 174 lb (78.9 kg)  ?05/25/21 173 lb (78.5 kg)  ? ? ?Physical Exam ?Constitutional:   ?   General: She is not in acute distress. ?   Appearance: She is well-developed.  ?HENT:  ?   Head: Normocephalic.  ?   Right Ear: External ear normal.  ?   Left Ear: External ear normal.  ?   Nose: Nose normal.  ?Eyes:  ?   General:     ?   Right eye: No discharge.     ?   Left eye: No discharge.  ?   Conjunctiva/sclera: Conjunctivae normal.  ?   Pupils: Pupils are equal, round, and reactive to light.  ?Neck:  ?   Thyroid: No thyromegaly.  ?   Vascular: No JVD.  ?  Trachea: No tracheal deviation.  ?Cardiovascular:  ?   Rate and Rhythm: Normal rate and regular rhythm.  ?   Heart sounds: Normal heart sounds.  ?Pulmonary:  ?   Effort: No respiratory distress.  ?   Breath sounds: No stridor. No wheezing.  ?Abdominal:  ?   General: Bowel sounds are normal. There is no distension.  ?   Palpations: Abdomen is soft. There is no mass.  ?   Tenderness: There is no abdominal tenderness. There is no guarding or rebound.  ?Musculoskeletal:     ?   General: Tenderness present.  ?   Cervical back: Normal range of motion and neck supple. No rigidity.  ?Lymphadenopathy:  ?   Cervical: No cervical adenopathy.  ?Skin: ?   Findings: No erythema or rash.  ?Neurological:  ?   Cranial Nerves: No cranial nerve deficit.  ?   Motor: No abnormal muscle tone.  ?   Coordination: Coordination normal.  ?   Deep Tendon  Reflexes: Reflexes normal.  ?Psychiatric:     ?   Behavior: Behavior normal.     ?   Thought Content: Thought content normal.     ?   Judgment: Judgment normal.  ?LS w/pain w/ROM  ? ?Lab Results  ?Component Value Date  ? WBC 8.1 01/17/2021  ? HGB 14.4 01/17/2021  ? HCT 42.9 01/17/2021  ? PLT 264.0 01/17/2021  ? GLUCOSE 86 04/18/2021  ? CHOL 200 07/18/2018  ? TRIG 185.0 (H) 07/18/2018  ? HDL 39.70 07/18/2018  ? LDLDIRECT 160.0 01/02/2018  ? LDLCALC 124 (H) 07/18/2018  ? ALT 26 04/18/2021  ? AST 20 04/18/2021  ? NA 137 04/18/2021  ? K 4.1 04/18/2021  ? CL 103 04/18/2021  ? CREATININE 1.08 04/18/2021  ? BUN 21 04/18/2021  ? CO2 26 04/18/2021  ? TSH 1.58 12/11/2019  ? HGBA1C 5.3 11/17/2020  ? ? ?US Abdomen Complete ? ?Result Date: 12/24/2019 ?CLINICAL DATA:  Intermittent left upper quadrant pain. EXAM: ABDOMEN ULTRASOUND COMPLETE COMPARISON:  Abdominal ultrasound dated February 18, 2014. FINDINGS: Gallbladder: No gallstones or wall thickening visualized. No sonographic Murphy sign noted by sonographer. Common bile duct: Diameter: 4 mm, normal. Liver: Unchanged 2.8 cm simple cyst in the right liver. 1.2 cm simple cyst in the left liver, not seen on the prior study. Patchy increased parenchymal echogenicity. Portal vein is patent on color Doppler imaging with normal direction of blood flow towards the liver. IVC: No abnormality visualized. Pancreas: Visualized portion unremarkable. Spleen: Size and appearance within normal limits. Right Kidney: Length: 10.1 cm. Echogenicity within normal limits. No mass or hydronephrosis visualized. 1.7 cm simple cyst. Left Kidney: Length: 10.4 cm. Echogenicity within normal limits. No mass or hydronephrosis visualized. 1.5 cm simple cyst. Abdominal aorta: No aneurysm visualized. Other findings: None. IMPRESSION: 1. No acute abnormality. 2. Patchy increased parenchymal echogenicity of the liver, nonspecific but could represent steatosis or underlying hepatocellular disease. Correlate with  LFTs. 3. SImple hepatic and bilateral renal cysts. Electronically Signed   By: Titus Dubin M.D.   On: 12/24/2019 16:40  ? ? ?Assessment & Plan:  ? ?Problem List Items Addressed This Visit   ? ? CRF (chronic renal failure), stage 2 (mild)  ?  Pt saw Dr Justin Mend - he stopped vit D ?  ?  ? Gout  ?  Cont on allopurinol ?  ?  ? LOW BACK PAIN  ?  Norco prn ? Potential benefits of a long term opioids use as well as  potential risks (i.e. addiction risk, apnea etc) and complications (i.e. Somnolence, constipation and others) were explained to the patient and were aknowledged. ?  ?  ? Vitamin D deficiency  ?  Pt saw Dr Justin Mend - he stopped vit D ?  ?  ?  ? ? ?No orders of the defined types were placed in this encounter. ?  ? ? ?Follow-up: No follow-ups on file. ? ?Walker Kehr, MD ?

## 2021-07-22 DIAGNOSIS — N1831 Chronic kidney disease, stage 3a: Secondary | ICD-10-CM | POA: Diagnosis not present

## 2021-08-01 DIAGNOSIS — M5030 Other cervical disc degeneration, unspecified cervical region: Secondary | ICD-10-CM | POA: Diagnosis not present

## 2021-08-01 DIAGNOSIS — M5459 Other low back pain: Secondary | ICD-10-CM | POA: Diagnosis not present

## 2021-08-01 DIAGNOSIS — M542 Cervicalgia: Secondary | ICD-10-CM | POA: Diagnosis not present

## 2021-08-01 DIAGNOSIS — M5136 Other intervertebral disc degeneration, lumbar region: Secondary | ICD-10-CM | POA: Diagnosis not present

## 2021-08-01 DIAGNOSIS — Z5181 Encounter for therapeutic drug level monitoring: Secondary | ICD-10-CM | POA: Diagnosis not present

## 2021-08-01 DIAGNOSIS — Z79899 Other long term (current) drug therapy: Secondary | ICD-10-CM | POA: Diagnosis not present

## 2021-08-01 DIAGNOSIS — Z79891 Long term (current) use of opiate analgesic: Secondary | ICD-10-CM | POA: Diagnosis not present

## 2021-08-23 ENCOUNTER — Telehealth: Payer: Self-pay | Admitting: Internal Medicine

## 2021-08-23 NOTE — Telephone Encounter (Signed)
Pt states she has been experiencing dizziness for x2w ? ?Pt states she is looking for direction if she should come in sooner than her next ov 6-13 ? ?Advised pt I can schedule her a sooner appt if she is concerned ? ?Pt states she will "just wait it out" ? ?

## 2021-09-19 DIAGNOSIS — M25561 Pain in right knee: Secondary | ICD-10-CM | POA: Diagnosis not present

## 2021-09-19 DIAGNOSIS — M1711 Unilateral primary osteoarthritis, right knee: Secondary | ICD-10-CM | POA: Diagnosis not present

## 2021-10-05 DIAGNOSIS — N390 Urinary tract infection, site not specified: Secondary | ICD-10-CM | POA: Diagnosis not present

## 2021-10-05 DIAGNOSIS — M109 Gout, unspecified: Secondary | ICD-10-CM | POA: Diagnosis not present

## 2021-10-05 DIAGNOSIS — N1831 Chronic kidney disease, stage 3a: Secondary | ICD-10-CM | POA: Diagnosis not present

## 2021-10-05 DIAGNOSIS — I129 Hypertensive chronic kidney disease with stage 1 through stage 4 chronic kidney disease, or unspecified chronic kidney disease: Secondary | ICD-10-CM | POA: Diagnosis not present

## 2021-10-18 ENCOUNTER — Ambulatory Visit (INDEPENDENT_AMBULATORY_CARE_PROVIDER_SITE_OTHER): Payer: Medicare Other | Admitting: Internal Medicine

## 2021-10-18 ENCOUNTER — Encounter: Payer: Self-pay | Admitting: Internal Medicine

## 2021-10-18 DIAGNOSIS — N182 Chronic kidney disease, stage 2 (mild): Secondary | ICD-10-CM | POA: Diagnosis not present

## 2021-10-18 DIAGNOSIS — I10 Essential (primary) hypertension: Secondary | ICD-10-CM

## 2021-10-18 DIAGNOSIS — M159 Polyosteoarthritis, unspecified: Secondary | ICD-10-CM

## 2021-10-18 DIAGNOSIS — E559 Vitamin D deficiency, unspecified: Secondary | ICD-10-CM

## 2021-10-18 LAB — CBC WITH DIFFERENTIAL/PLATELET
Basophils Absolute: 0.1 10*3/uL (ref 0.0–0.1)
Basophils Relative: 0.8 % (ref 0.0–3.0)
Eosinophils Absolute: 0.2 10*3/uL (ref 0.0–0.7)
Eosinophils Relative: 2.7 % (ref 0.0–5.0)
HCT: 43.8 % (ref 36.0–46.0)
Hemoglobin: 15 g/dL (ref 12.0–15.0)
Lymphocytes Relative: 29.6 % (ref 12.0–46.0)
Lymphs Abs: 2.1 10*3/uL (ref 0.7–4.0)
MCHC: 34.3 g/dL (ref 30.0–36.0)
MCV: 89.8 fl (ref 78.0–100.0)
Monocytes Absolute: 0.6 10*3/uL (ref 0.1–1.0)
Monocytes Relative: 7.9 % (ref 3.0–12.0)
Neutro Abs: 4.3 10*3/uL (ref 1.4–7.7)
Neutrophils Relative %: 59 % (ref 43.0–77.0)
Platelets: 262 10*3/uL (ref 150.0–400.0)
RBC: 4.88 Mil/uL (ref 3.87–5.11)
RDW: 13.5 % (ref 11.5–15.5)
WBC: 7.2 10*3/uL (ref 4.0–10.5)

## 2021-10-18 LAB — COMPREHENSIVE METABOLIC PANEL
ALT: 21 U/L (ref 0–35)
AST: 17 U/L (ref 0–37)
Albumin: 4 g/dL (ref 3.5–5.2)
Alkaline Phosphatase: 92 U/L (ref 39–117)
BUN: 22 mg/dL (ref 6–23)
CO2: 27 mEq/L (ref 19–32)
Calcium: 10.3 mg/dL (ref 8.4–10.5)
Chloride: 103 mEq/L (ref 96–112)
Creatinine, Ser: 1.12 mg/dL (ref 0.40–1.20)
GFR: 48.88 mL/min — ABNORMAL LOW (ref >60.00)
Glucose, Bld: 80 mg/dL (ref 70–99)
Potassium: 4.3 mEq/L (ref 3.5–5.1)
Sodium: 137 mEq/L (ref 135–145)
Total Bilirubin: 0.5 mg/dL (ref 0.2–1.2)
Total Protein: 6.9 g/dL (ref 6.0–8.3)

## 2021-10-18 LAB — URINALYSIS
Bilirubin Urine: NEGATIVE
Hgb urine dipstick: NEGATIVE
Ketones, ur: NEGATIVE
Leukocytes,Ua: NEGATIVE
Nitrite: NEGATIVE
Specific Gravity, Urine: 1.01 (ref 1.000–1.030)
Total Protein, Urine: NEGATIVE
Urine Glucose: NEGATIVE
Urobilinogen, UA: 0.2 (ref 0.0–1.0)
pH: 6 (ref 5.0–8.0)

## 2021-10-18 LAB — LIPID PANEL
Cholesterol: 210 mg/dL — ABNORMAL HIGH (ref 0–200)
HDL: 50.4 mg/dL (ref >39.00)
LDL Cholesterol: 121 mg/dL — ABNORMAL HIGH (ref 0–99)
NonHDL: 159.47
Total CHOL/HDL Ratio: 4
Triglycerides: 193 mg/dL — ABNORMAL HIGH (ref 0.0–149.0)
VLDL: 38.6 mg/dL (ref 0.0–40.0)

## 2021-10-18 LAB — TSH: TSH: 1.37 u[IU]/mL (ref 0.35–5.50)

## 2021-10-18 MED ORDER — CARVEDILOL 12.5 MG PO TABS: ORAL_TABLET | ORAL | 3 refills | Status: DC

## 2021-10-18 MED ORDER — SPIRONOLACTONE 50 MG PO TABS: 50.0000 mg | ORAL_TABLET | Freq: Every day | ORAL | 3 refills | Status: DC

## 2021-10-18 NOTE — Assessment & Plan Note (Signed)
Seeing Dr Johnney Ou now

## 2021-10-18 NOTE — Progress Notes (Signed)
Subjective:  Patient ID: Dawn Howard, female    DOB: [birth year]  Age: 71 y.o. MRN: [id number]  CC: No chief complaint on file.   HPI Valene Villa presents for HTN, OA, GERD, CRI  Outpatient Medications Prior to Visit  Medication Sig Dispense Refill   allopurinol (ZYLOPRIM) 100 MG tablet Take 0.5 tablets (50 mg total) by mouth daily. 90 tablet 3   b complex vitamins tablet Take 1 tablet by mouth daily. 100 tablet 0   furosemide (LASIX) 20 MG tablet Take 1-2 tablets (20-40 mg total) by mouth daily as needed for edema. 30 tablet 3   HYDROcodone-acetaminophen (NORCO) 7.5-325 MG tablet Take 1 tablet by mouth 2 (two) times daily as needed. Take 1 by twice a day as needed     MEGARED OMEGA-3 KRILL OIL PO Take by mouth.     Multiple Vitamin (MULITIVITAMIN WITH MINERALS) TABS Take 1 tablet by mouth daily.     pantoprazole (PROTONIX) 40 MG tablet Take 1 tablet (40 mg total) by mouth daily. 90 tablet 3   carvedilol (COREG) 12.5 MG tablet TAKE  (1)  TABLET TWICE A DAY WITH MEALS (BREAKFAST AND SUPPER) 180 tablet 3   spironolactone (ALDACTONE) 50 MG tablet Take 1 tablet (50 mg total) by mouth daily. 90 tablet 3   cefdinir (OMNICEF) 300 MG capsule Take 1 capsule (300 mg total) by mouth 2 (two) times daily. (Patient not taking: Reported on 07/18/2021) 20 capsule 0   No facility-administered medications prior to visit.    ROS: Review of Systems  Constitutional:  Negative for activity change, appetite change, chills, fatigue and unexpected weight change.  HENT:  Negative for congestion, mouth sores and sinus pressure.   Eyes:  Negative for visual disturbance.  Respiratory:  Negative for cough and chest tightness.   Gastrointestinal:  Negative for abdominal pain and nausea.  Genitourinary:  Negative for difficulty urinating, frequency and vaginal pain.  Musculoskeletal:  Positive for back pain. Negative for arthralgias and gait problem.  Skin:  Negative for pallor and rash.  Neurological:   Negative for dizziness, tremors, weakness, numbness and headaches.  Psychiatric/Behavioral:  Negative for confusion and sleep disturbance.     Objective:  BP 120/60 (BP Location: Left Arm, Patient Position: Sitting, Cuff Size: Normal)   Pulse 78   Temp 97.8 F (36.6 C) (Oral)   Ht '5\' 3"'$  (1.6 m)   Wt 180 lb (81.6 kg)   SpO2 98%   BMI 31.89 kg/m   BP Readings from Last 3 Encounters:  10/18/21 120/60  07/18/21 110/70  07/18/21 110/70    Wt Readings from Last 3 Encounters:  10/18/21 180 lb (81.6 kg)  07/18/21 174 lb (78.9 kg)  07/18/21 174 lb (78.9 kg)    Physical Exam Constitutional:      General: She is not in acute distress.    Appearance: She is well-developed.  HENT:     Head: Normocephalic.     Right Ear: External ear normal.     Left Ear: External ear normal.     Nose: Nose normal.  Eyes:     General:        Right eye: No discharge.        Left eye: No discharge.     Conjunctiva/sclera: Conjunctivae normal.     Pupils: Pupils are equal, round, and reactive to light.  Neck:     Thyroid: No thyromegaly.     Vascular: No JVD.     Trachea: No tracheal  deviation.  Cardiovascular:     Rate and Rhythm: Normal rate and regular rhythm.     Heart sounds: Normal heart sounds.  Pulmonary:     Effort: No respiratory distress.     Breath sounds: No stridor. No wheezing.  Abdominal:     General: Bowel sounds are normal. There is no distension.     Palpations: Abdomen is soft. There is no mass.     Tenderness: There is no abdominal tenderness. There is no guarding or rebound.  Musculoskeletal:        General: No tenderness.     Cervical back: Normal range of motion and neck supple. No rigidity.  Lymphadenopathy:     Cervical: No cervical adenopathy.  Skin:    Findings: No erythema or rash.  Neurological:     Cranial Nerves: No cranial nerve deficit.     Motor: No abnormal muscle tone.     Coordination: Coordination normal.     Deep Tendon Reflexes: Reflexes  normal.  Psychiatric:        Behavior: Behavior normal.        Thought Content: Thought content normal.        Judgment: Judgment normal.     Lab Results  Component Value Date   WBC 8.1 01/17/2021   HGB 14.4 01/17/2021   HCT 42.9 01/17/2021   PLT 264.0 01/17/2021   GLUCOSE 86 04/18/2021   CHOL 200 07/18/2018   TRIG 185.0 (H) 07/18/2018   HDL 39.70 07/18/2018   LDLDIRECT 160.0 01/02/2018   LDLCALC 124 (H) 07/18/2018   ALT 26 04/18/2021   AST 20 04/18/2021   NA 137 04/18/2021   K 4.1 04/18/2021   CL 103 04/18/2021   CREATININE 1.08 04/18/2021   BUN 21 04/18/2021   CO2 26 04/18/2021   TSH 1.58 12/11/2019   HGBA1C 5.3 11/17/2020    US Abdomen Complete  Result Date: 12/24/2019 CLINICAL DATA:  Intermittent left upper quadrant pain. EXAM: ABDOMEN ULTRASOUND COMPLETE COMPARISON:  Abdominal ultrasound dated February 18, 2014. FINDINGS: Gallbladder: No gallstones or wall thickening visualized. No sonographic Murphy sign noted by sonographer. Common bile duct: Diameter: 4 mm, normal. Liver: Unchanged 2.8 cm simple cyst in the right liver. 1.2 cm simple cyst in the left liver, not seen on the prior study. Patchy increased parenchymal echogenicity. Portal vein is patent on color Doppler imaging with normal direction of blood flow towards the liver. IVC: No abnormality visualized. Pancreas: Visualized portion unremarkable. Spleen: Size and appearance within normal limits. Right Kidney: Length: 10.1 cm. Echogenicity within normal limits. No mass or hydronephrosis visualized. 1.7 cm simple cyst. Left Kidney: Length: 10.4 cm. Echogenicity within normal limits. No mass or hydronephrosis visualized. 1.5 cm simple cyst. Abdominal aorta: No aneurysm visualized. Other findings: None. IMPRESSION: 1. No acute abnormality. 2. Patchy increased parenchymal echogenicity of the liver, nonspecific but could represent steatosis or underlying hepatocellular disease. Correlate with LFTs. 3. SImple hepatic and  bilateral renal cysts. Electronically Signed   By: Titus Dubin M.D.   On: 12/24/2019 16:40    Assessment & Plan:   Problem List Items Addressed This Visit     CRF (chronic renal failure), stage 2 (mild)    Seeing Dr Johnney Ou now      Relevant Orders   TSH   Urinalysis   CBC with Differential/Platelet   Lipid panel   Comprehensive metabolic panel   Essential hypertension    Now seeing Dr Johnney Ou.  Cont on Coreg, Spironolactone Pt had labs in  May      Relevant Medications   carvedilol (COREG) 12.5 MG tablet   spironolactone (ALDACTONE) 50 MG tablet   Other Relevant Orders   TSH   Urinalysis   CBC with Differential/Platelet   Lipid panel   Comprehensive metabolic panel   Osteoarthritis    Pt will have knee injection - Dr Alvan Dame      Vitamin D deficiency    Now seeing Dr Johnney Ou. Pt restarted Vit D         Meds ordered this encounter  Medications   carvedilol (COREG) 12.5 MG tablet    Sig: TAKE  (1)  TABLET TWICE A DAY WITH MEALS (BREAKFAST AND SUPPER)    Dispense:  180 tablet    Refill:  3   spironolactone (ALDACTONE) 50 MG tablet    Sig: Take 1 tablet (50 mg total) by mouth daily.    Dispense:  90 tablet    Refill:  3      Follow-up: Return in about 6 months (around 04/19/2022) for Wellness Exam.  Walker Kehr, MD

## 2021-10-18 NOTE — Assessment & Plan Note (Signed)
Now seeing Dr Johnney Ou.  Cont on Coreg, Spironolactone Pt had labs in May

## 2021-10-18 NOTE — Assessment & Plan Note (Signed)
Pt will have knee injection - Dr Alvan Dame

## 2021-10-18 NOTE — Assessment & Plan Note (Signed)
Now seeing Dr Johnney Ou. Pt restarted Vit D

## 2021-10-25 DIAGNOSIS — M25561 Pain in right knee: Secondary | ICD-10-CM | POA: Insufficient documentation

## 2021-10-25 DIAGNOSIS — M1711 Unilateral primary osteoarthritis, right knee: Secondary | ICD-10-CM | POA: Insufficient documentation

## 2021-10-27 DIAGNOSIS — M1711 Unilateral primary osteoarthritis, right knee: Secondary | ICD-10-CM | POA: Diagnosis not present

## 2021-11-03 DIAGNOSIS — M1711 Unilateral primary osteoarthritis, right knee: Secondary | ICD-10-CM | POA: Diagnosis not present

## 2021-11-16 DIAGNOSIS — M25572 Pain in left ankle and joints of left foot: Secondary | ICD-10-CM | POA: Diagnosis not present

## 2021-11-28 DIAGNOSIS — M5136 Other intervertebral disc degeneration, lumbar region: Secondary | ICD-10-CM | POA: Diagnosis not present

## 2021-11-28 DIAGNOSIS — M5030 Other cervical disc degeneration, unspecified cervical region: Secondary | ICD-10-CM | POA: Diagnosis not present

## 2021-11-28 DIAGNOSIS — M542 Cervicalgia: Secondary | ICD-10-CM | POA: Diagnosis not present

## 2021-11-28 DIAGNOSIS — M5459 Other low back pain: Secondary | ICD-10-CM | POA: Diagnosis not present

## 2021-12-02 DIAGNOSIS — M25572 Pain in left ankle and joints of left foot: Secondary | ICD-10-CM | POA: Diagnosis not present

## 2021-12-07 ENCOUNTER — Ambulatory Visit (INDEPENDENT_AMBULATORY_CARE_PROVIDER_SITE_OTHER): Payer: Medicare Other | Admitting: Internal Medicine

## 2021-12-07 VITALS — BP 120/72 | HR 82 | Temp 97.7°F | Ht 63.0 in | Wt 185.0 lb

## 2021-12-07 DIAGNOSIS — I10 Essential (primary) hypertension: Secondary | ICD-10-CM | POA: Diagnosis not present

## 2021-12-07 DIAGNOSIS — H6691 Otitis media, unspecified, right ear: Secondary | ICD-10-CM | POA: Diagnosis not present

## 2021-12-07 DIAGNOSIS — E559 Vitamin D deficiency, unspecified: Secondary | ICD-10-CM | POA: Diagnosis not present

## 2021-12-07 MED ORDER — DOXYCYCLINE HYCLATE 100 MG PO TABS: 100.0000 mg | ORAL_TABLET | Freq: Two times a day (BID) | ORAL | 0 refills | Status: DC

## 2021-12-07 NOTE — Patient Instructions (Signed)
Please take all new medication as prescribed - the antibiotic 
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
## 2021-12-08 ENCOUNTER — Encounter: Payer: Self-pay | Admitting: Internal Medicine

## 2021-12-08 DIAGNOSIS — H6691 Otitis media, unspecified, right ear: Secondary | ICD-10-CM | POA: Insufficient documentation

## 2021-12-08 NOTE — Assessment & Plan Note (Signed)
Mild to mod, for antibx course - doxycycline course,  to f/u any worsening symptoms or concerns

## 2021-12-08 NOTE — Assessment & Plan Note (Signed)
Last vitamin D Lab Results  Component Value Date   VD25OH 27.79 (L) 06/08/2015   Ow, to restart oral replacement

## 2021-12-08 NOTE — Assessment & Plan Note (Signed)
BP Readings from Last 3 Encounters:  12/07/21 120/72  10/18/21 120/60  07/18/21 110/70   Stable, pt to continue medical treatment coreg 12.5 bid, aldactone 50 qd

## 2022-03-15 DIAGNOSIS — H16141 Punctate keratitis, right eye: Secondary | ICD-10-CM | POA: Diagnosis not present

## 2022-03-15 DIAGNOSIS — H0102A Squamous blepharitis right eye, upper and lower eyelids: Secondary | ICD-10-CM | POA: Diagnosis not present

## 2022-03-15 DIAGNOSIS — H04123 Dry eye syndrome of bilateral lacrimal glands: Secondary | ICD-10-CM | POA: Diagnosis not present

## 2022-03-15 DIAGNOSIS — Z961 Presence of intraocular lens: Secondary | ICD-10-CM | POA: Diagnosis not present

## 2022-03-15 DIAGNOSIS — H43813 Vitreous degeneration, bilateral: Secondary | ICD-10-CM | POA: Diagnosis not present

## 2022-03-15 DIAGNOSIS — H17821 Peripheral opacity of cornea, right eye: Secondary | ICD-10-CM | POA: Diagnosis not present

## 2022-03-15 DIAGNOSIS — H0102B Squamous blepharitis left eye, upper and lower eyelids: Secondary | ICD-10-CM | POA: Diagnosis not present

## 2022-04-03 ENCOUNTER — Telehealth: Payer: Self-pay | Admitting: *Deleted

## 2022-04-03 ENCOUNTER — Encounter: Payer: Self-pay | Admitting: *Deleted

## 2022-04-03 DIAGNOSIS — M5136 Other intervertebral disc degeneration, lumbar region: Secondary | ICD-10-CM | POA: Diagnosis not present

## 2022-04-03 DIAGNOSIS — M5030 Other cervical disc degeneration, unspecified cervical region: Secondary | ICD-10-CM | POA: Diagnosis not present

## 2022-04-03 NOTE — Patient Outreach (Signed)
  Care Coordination TOC Note Transition Care Management Follow-up Telephone Call Date of discharge and from where: per [facility] review patient experienced hospital admission 11/18-22/2023; patient states she has not been hospitalized recently; patient was encouraged to follow up with her insurance company to make sure there have been no hospital claims filed; she was also encouraged to follow up with medical records as needed after contacting her insurance company How have you been since you were released from the hospital? N/A Any questions or concerns? No  Items Reviewed: Did the pt receive and understand the discharge instructions provided?  N/A- patient denies recent hospital visit Medications obtained and verified?  N/A Other?  N/A Any new allergies since your discharge?  N/A Dietary orders reviewed? No Do you have support at home?  N/A  Home Care and Equipment/Supplies: Were home health services ordered? not applicable If so, what is the name of the agency? N/A  Has the agency set up a time to come to the patient's home? not applicable Were any new equipment or medical supplies ordered?  No N/A What is the name of the medical supply agency? N/A Were you able to get the supplies/equipment? not applicable Do you have any questions related to the use of the equipment or supplies? No N/A  Functional Questionnaire: (I = Independent and D = Dependent) ADLs: N/A  Bathing/Dressing- N/A  Meal Prep- N/A  Eating- N/A  Maintaining continence- N/A  Transferring/Ambulation- N/A  Managing Meds- N/A  Follow up appointments reviewed:  PCP Hospital f/u appt confirmed?  N/A  Scheduled to see PCP for routine office visit on Monday 04/10/22 @ 11:20 am [facility] f/u appt confirmed?  N/A  Scheduled to see - on - @ - Are transportation arrangements needed?  N/A If their condition worsens, is the pt aware to call PCP or go to the Emergency Dept.? No N/A Was the patient provided with  contact information for the PCP's office or ED? Yes Was to pt encouraged to call back with questions or concerns? Yes  SDOH assessments and interventions completed:   No- not indicated  Care Coordination Interventions Activated:  No   Care Coordination Interventions:  No Care Coordination interventions needed at this time.   Encounter Outcome:  Pt. Visit Completed    Oneta Rack, RN, BSN, CCRN Alumnus RN CM Care Coordination/ Transition of [facility] Management [phone]: direct office

## 2022-04-10 ENCOUNTER — Ambulatory Visit: Payer: Medicare Other | Admitting: Internal Medicine

## 2022-04-12 ENCOUNTER — Ambulatory Visit (INDEPENDENT_AMBULATORY_CARE_PROVIDER_SITE_OTHER): Payer: Medicare Other | Admitting: Internal Medicine

## 2022-04-12 ENCOUNTER — Other Ambulatory Visit: Payer: Self-pay | Admitting: Internal Medicine

## 2022-04-12 ENCOUNTER — Encounter: Payer: Self-pay | Admitting: Internal Medicine

## 2022-04-12 VITALS — BP 122/74 | HR 90 | Temp 98.3°F | Ht 63.0 in | Wt 187.0 lb

## 2022-04-12 DIAGNOSIS — R002 Palpitations: Secondary | ICD-10-CM | POA: Diagnosis not present

## 2022-04-12 DIAGNOSIS — K219 Gastro-esophageal reflux disease without esophagitis: Secondary | ICD-10-CM

## 2022-04-12 DIAGNOSIS — E559 Vitamin D deficiency, unspecified: Secondary | ICD-10-CM | POA: Diagnosis not present

## 2022-04-12 DIAGNOSIS — Z23 Encounter for immunization: Secondary | ICD-10-CM

## 2022-04-12 DIAGNOSIS — J209 Acute bronchitis, unspecified: Secondary | ICD-10-CM

## 2022-04-12 DIAGNOSIS — E669 Obesity, unspecified: Secondary | ICD-10-CM | POA: Diagnosis not present

## 2022-04-12 DIAGNOSIS — R109 Unspecified abdominal pain: Secondary | ICD-10-CM

## 2022-04-12 DIAGNOSIS — N182 Chronic kidney disease, stage 2 (mild): Secondary | ICD-10-CM

## 2022-04-12 DIAGNOSIS — E66811 Obesity, class 1: Secondary | ICD-10-CM | POA: Insufficient documentation

## 2022-04-12 LAB — LIPID PANEL
Cholesterol: 215 mg/dL — ABNORMAL HIGH (ref 0–200)
HDL: 47.7 mg/dL (ref >39.00)
LDL Cholesterol: 128 mg/dL — ABNORMAL HIGH (ref 0–99)
NonHDL: 167.3
Total CHOL/HDL Ratio: 5
Triglycerides: 199 mg/dL — ABNORMAL HIGH (ref 0.0–149.0)
VLDL: 39.8 mg/dL (ref 0.0–40.0)

## 2022-04-12 LAB — COMPREHENSIVE METABOLIC PANEL
ALT: 25 U/L (ref 0–35)
AST: 20 U/L (ref 0–37)
Albumin: 4.3 g/dL (ref 3.5–5.2)
Alkaline Phosphatase: 88 U/L (ref 39–117)
BUN: 24 mg/dL — ABNORMAL HIGH (ref 6–23)
CO2: 25 mEq/L (ref 19–32)
Calcium: 10.4 mg/dL (ref 8.4–10.5)
Chloride: 104 mEq/L (ref 96–112)
Creatinine, Ser: 1.27 mg/dL — ABNORMAL HIGH (ref 0.40–1.20)
GFR: 41.89 mL/min — ABNORMAL LOW (ref >60.00)
Glucose, Bld: 83 mg/dL (ref 70–99)
Potassium: 4 mEq/L (ref 3.5–5.1)
Sodium: 135 mEq/L (ref 135–145)
Total Bilirubin: 0.5 mg/dL (ref 0.2–1.2)
Total Protein: 7.2 g/dL (ref 6.0–8.3)

## 2022-04-12 LAB — CBC WITH DIFFERENTIAL/PLATELET
Basophils Absolute: 0.1 10*3/uL (ref 0.0–0.1)
Basophils Relative: 0.9 % (ref 0.0–3.0)
Eosinophils Absolute: 0.2 10*3/uL (ref 0.0–0.7)
Eosinophils Relative: 2.7 % (ref 0.0–5.0)
HCT: 46.2 % — ABNORMAL HIGH (ref 36.0–46.0)
Hemoglobin: 15.7 g/dL — ABNORMAL HIGH (ref 12.0–15.0)
Lymphocytes Relative: 24.5 % (ref 12.0–46.0)
Lymphs Abs: 1.8 10*3/uL (ref 0.7–4.0)
MCHC: 34 g/dL (ref 30.0–36.0)
MCV: 89.1 fl (ref 78.0–100.0)
Monocytes Absolute: 0.5 10*3/uL (ref 0.1–1.0)
Monocytes Relative: 7.6 % (ref 3.0–12.0)
Neutro Abs: 4.7 10*3/uL (ref 1.4–7.7)
Neutrophils Relative %: 64.3 % (ref 43.0–77.0)
Platelets: 285 10*3/uL (ref 150.0–400.0)
RBC: 5.18 Mil/uL — ABNORMAL HIGH (ref 3.87–5.11)
RDW: 13.2 % (ref 11.5–15.5)
WBC: 7.2 10*3/uL (ref 4.0–10.5)

## 2022-04-12 LAB — URINALYSIS
Bilirubin Urine: NEGATIVE
Hgb urine dipstick: NEGATIVE
Ketones, ur: NEGATIVE
Leukocytes,Ua: NEGATIVE
Nitrite: NEGATIVE
Specific Gravity, Urine: 1.01 (ref 1.000–1.030)
Total Protein, Urine: NEGATIVE
Urine Glucose: NEGATIVE
Urobilinogen, UA: 0.2 (ref 0.0–1.0)
pH: 6 (ref 5.0–8.0)

## 2022-04-12 LAB — TSH: TSH: 1.96 u[IU]/mL (ref 0.35–5.50)

## 2022-04-12 MED ORDER — PANTOPRAZOLE SODIUM 40 MG PO TBEC: 40.0000 mg | DELAYED_RELEASE_TABLET | Freq: Every day | ORAL | 3 refills | Status: DC

## 2022-04-12 MED ORDER — HYDROCODONE BIT-HOMATROP MBR 5-1.5 MG/5ML PO SOLN: 5.0000 mL | Freq: Three times a day (TID) | ORAL | 0 refills | Status: DC | PRN

## 2022-04-12 NOTE — Assessment & Plan Note (Signed)
Seeing Dr Johnney Ou

## 2022-04-12 NOTE — Addendum Note (Signed)
Addended by: Basil Dess on: 04/12/2022 11:33 AM   Modules accepted: Orders

## 2022-04-12 NOTE — Assessment & Plan Note (Signed)
Hycodan syr prn  Potential benefits of a short/long term opioids use as well as potential risks (i.e. addiction risk, apnea etc) and complications (i.e. Somnolence, constipation and others) were explained to the patient and were aknowledged.

## 2022-04-12 NOTE — Assessment & Plan Note (Addendum)
Worse Diet discussed

## 2022-04-12 NOTE — Assessment & Plan Note (Signed)
On Vit D 

## 2022-04-12 NOTE — Progress Notes (Signed)
Subjective:  Patient ID: Dawn Howard, female    DOB: Oct 25, 1950  Age: 71 y.o. MRN: [id number]  CC: Follow-up   HPI Dawn Howard presents for gout, HTN, OA C/o cough post-recent URI C/o wt gain  Outpatient Medications Prior to Visit  Medication Sig Dispense Refill   allopurinol (ZYLOPRIM) 100 MG tablet Take 0.5 tablets (50 mg total) by mouth daily. 90 tablet 3   b complex vitamins tablet Take 1 tablet by mouth daily. 100 tablet 0   carvedilol (COREG) 12.5 MG tablet TAKE  (1)  TABLET TWICE A DAY WITH MEALS (BREAKFAST AND SUPPER) 180 tablet 3   furosemide (LASIX) 20 MG tablet Take 1-2 tablets (20-40 mg total) by mouth daily as needed for edema. 30 tablet 3   HYDROcodone-acetaminophen (NORCO) 7.5-325 MG tablet Take 1 tablet by mouth 2 (two) times daily as needed. Take 1 by twice a day as needed     MEGARED OMEGA-3 KRILL OIL PO Take by mouth.     meloxicam (MOBIC) 15 MG tablet Take 1 tablet every day by oral route with meals for 30 days.     Multiple Vitamin (MULITIVITAMIN WITH MINERALS) TABS Take 1 tablet by mouth daily.     pantoprazole (PROTONIX) 40 MG tablet Take 1 tablet (40 mg total) by mouth daily. 90 tablet 3   spironolactone (ALDACTONE) 50 MG tablet Take 1 tablet (50 mg total) by mouth daily. 90 tablet 3   doxycycline (VIBRA-TABS) 100 MG tablet Take 1 tablet (100 mg total) by mouth 2 (two) times daily. (Patient not taking: Reported on 04/12/2022) 20 tablet 0   No facility-administered medications prior to visit.    ROS: Review of Systems  Constitutional:  Negative for activity change, appetite change, chills, fatigue and unexpected weight change.  HENT:  Negative for congestion, mouth sores and sinus pressure.   Eyes:  Negative for visual disturbance.  Respiratory:  Positive for cough. Negative for chest tightness.   Gastrointestinal:  Negative for abdominal pain and nausea.  Genitourinary:  Negative for difficulty urinating, frequency and vaginal pain.   Musculoskeletal:  Negative for back pain and gait problem.  Skin:  Negative for pallor and rash.  Neurological:  Negative for dizziness, tremors, weakness, numbness and headaches.  Psychiatric/Behavioral:  Negative for confusion, sleep disturbance and suicidal ideas. The patient is nervous/anxious.     Objective:  BP 122/74 (BP Location: Right Arm, Patient Position: Sitting, Cuff Size: Normal)   Pulse 90   Temp 98.3 F (36.8 C) (Oral)   Ht '5\' 3"'$  (1.6 m)   Wt 187 lb (84.8 kg)   SpO2 96%   BMI 33.13 kg/m   BP Readings from Last 3 Encounters:  04/12/22 122/74  12/07/21 120/72  10/18/21 120/60    Wt Readings from Last 3 Encounters:  04/12/22 187 lb (84.8 kg)  12/07/21 185 lb (83.9 kg)  10/18/21 180 lb (81.6 kg)    Physical Exam Constitutional:      General: She is not in acute distress.    Appearance: She is well-developed. She is obese.  HENT:     Head: Normocephalic.     Right Ear: External ear normal.     Left Ear: External ear normal.     Nose: Nose normal.  Eyes:     General:        Right eye: No discharge.        Left eye: No discharge.     Conjunctiva/sclera: Conjunctivae normal.     Pupils:  Pupils are equal, round, and reactive to light.  Neck:     Thyroid: No thyromegaly.     Vascular: No JVD.     Trachea: No tracheal deviation.  Cardiovascular:     Rate and Rhythm: Normal rate and regular rhythm.     Heart sounds: Normal heart sounds.  Pulmonary:     Effort: No respiratory distress.     Breath sounds: No stridor. No wheezing.  Abdominal:     General: Bowel sounds are normal. There is no distension.     Palpations: Abdomen is soft. There is no mass.     Tenderness: There is no abdominal tenderness. There is no guarding or rebound.  Musculoskeletal:        General: No tenderness.     Cervical back: Normal range of motion and neck supple. No rigidity.  Lymphadenopathy:     Cervical: No cervical adenopathy.  Skin:    Findings: No erythema or rash.   Neurological:     Mental Status: She is oriented to person, place, and time.     Cranial Nerves: No cranial nerve deficit.     Motor: No abnormal muscle tone.     Coordination: Coordination normal.     Deep Tendon Reflexes: Reflexes normal.  Psychiatric:        Behavior: Behavior normal.        Thought Content: Thought content normal.        Judgment: Judgment normal.     Lab Results  Component Value Date   WBC 7.2 10/18/2021   HGB 15.0 10/18/2021   HCT 43.8 10/18/2021   PLT 262.0 10/18/2021   GLUCOSE 80 10/18/2021   CHOL 210 (H) 10/18/2021   TRIG 193.0 (H) 10/18/2021   HDL 50.40 10/18/2021   LDLDIRECT 160.0 01/02/2018   LDLCALC 121 (H) 10/18/2021   ALT 21 10/18/2021   AST 17 10/18/2021   NA 137 10/18/2021   K 4.3 10/18/2021   CL 103 10/18/2021   CREATININE 1.12 10/18/2021   BUN 22 10/18/2021   CO2 27 10/18/2021   TSH 1.37 10/18/2021   HGBA1C 5.3 11/17/2020    US Abdomen Complete  Result Date: 12/24/2019 CLINICAL DATA:  Intermittent left upper quadrant pain. EXAM: ABDOMEN ULTRASOUND COMPLETE COMPARISON:  Abdominal ultrasound dated February 18, 2014. FINDINGS: Gallbladder: No gallstones or wall thickening visualized. No sonographic Murphy sign noted by sonographer. Common bile duct: Diameter: 4 mm, normal. Liver: Unchanged 2.8 cm simple cyst in the right liver. 1.2 cm simple cyst in the left liver, not seen on the prior study. Patchy increased parenchymal echogenicity. Portal vein is patent on color Doppler imaging with normal direction of blood flow towards the liver. IVC: No abnormality visualized. Pancreas: Visualized portion unremarkable. Spleen: Size and appearance within normal limits. Right Kidney: Length: 10.1 cm. Echogenicity within normal limits. No mass or hydronephrosis visualized. 1.7 cm simple cyst. Left Kidney: Length: 10.4 cm. Echogenicity within normal limits. No mass or hydronephrosis visualized. 1.5 cm simple cyst. Abdominal aorta: No aneurysm visualized.  Other findings: None. IMPRESSION: 1. No acute abnormality. 2. Patchy increased parenchymal echogenicity of the liver, nonspecific but could represent steatosis or underlying hepatocellular disease. Correlate with LFTs. 3. SImple hepatic and bilateral renal cysts. Electronically Signed   By: Titus Dubin M.D.   On: 12/24/2019 16:40    Assessment & Plan:   Problem List Items Addressed This Visit     Acute bronchitis - Primary    Hycodan syr prn  Potential benefits of a  short/long term opioids use as well as potential risks (i.e. addiction risk, apnea etc) and complications (i.e. Somnolence, constipation and others) were explained to the patient and were aknowledged.      CRF (chronic renal failure), stage 2 (mild)    Seeing Dr Johnney Ou      GERD    Dr Silverio Decamp  EGD 2022 Chronic  Re-start Protonix Pepcid      Obesity (BMI 30.0-34.9)    Worse Diet discussed       Vitamin D deficiency    On Vit D         No orders of the defined types were placed in this encounter.     Follow-up: Return in about 3 months (around 07/12/2022) for a follow-up visit.  Walker Kehr, MD

## 2022-04-12 NOTE — Assessment & Plan Note (Signed)
Dr Silverio Decamp  EGD 2022 Chronic  Re-start Protonix Pepcid

## 2022-04-12 NOTE — Patient Instructions (Signed)
Sign up for [employer] ( via [employer] on your phone or your ipad). If you don't have a Art therapist card  - go to [employer] branch. They will set you up in 15 minutes. It is free. You can check out books to read and to listen, check out magazines and newspapers, movies etc.   The Obesity Code book by Sharman Cheek   These suggestions will probably help you to improve your metabolism if you are not overweight and to lose weight if you are overweight: 1.  Reduce your consumption of sugars and starches.  Eliminate high fructose corn syrup from your diet.  Reduce your consumption of processed foods.  For desserts try to have seasonal fruits, berries, nuts, cheeses or dark chocolate with more than 70% cacao. 2.  Do not snack 3.  You do not have to eat breakfast.  If you choose to have breakfast - eat plain greek yogurt, eggs, oatmeal (without sugar) - use honey if you need to. 4.  Drink water, freshly brewed unsweetened tea (green, black or herbal) or coffee.  Do not drink sodas including diet sodas , juices, beverages sweetened with artificial sweeteners. 5.  Reduce your consumption of refined grains. 6.  Avoid protein drinks such as Optifast, Slim fast etc. Eat chicken, fish, meat, dairy and beans for your sources of protein. 7.  Natural unprocessed fats like cold pressed virgin olive oil, butter, coconut oil are good for you.  Eat avocados. 8.  Increase your consumption of fiber.  Fruits, berries, vegetables, whole grains, flaxseed, chia seeds, beans, popcorn, nuts, oatmeal are good sources of fiber 9.  Use vinegar in your diet, i.e. apple cider vinegar, red wine or balsamic vinegar 10.  You can try fasting.  For example you can skip breakfast and lunch every other day (24-hour fast) 11.  Stress reduction, good night sleep, relaxation, meditation, yoga and other physical activity is likely to help you to maintain low weight too. 12.  If you drink alcohol, limit your alcohol intake to no more than  2 drinks a day.

## 2022-04-27 ENCOUNTER — Telehealth: Payer: Self-pay | Admitting: Internal Medicine

## 2022-04-27 MED ORDER — OSELTAMIVIR PHOSPHATE 75 MG PO CAPS: 75.0000 mg | ORAL_CAPSULE | Freq: Two times a day (BID) | ORAL | 0 refills | Status: DC

## 2022-04-27 NOTE — Telephone Encounter (Signed)
Ok thx.

## 2022-04-27 NOTE — Telephone Encounter (Signed)
Patient called and stated that she just left the pediatrician's office with her grandson and he just tested positive for type A Flu and the pediatrician advised the patient to call her PCP and see if Dr. Camila Li can call her in some Tamiflu since she has been taking care of her grandson due to the mom being in the hospital. Patient would like medication to be sent to pharmacy on file. Patient did state that she is currently experiencing a slight head cold. Patient can be best reached at [phone].

## 2022-07-11 ENCOUNTER — Ambulatory Visit: Payer: Medicare Other | Admitting: Internal Medicine

## 2022-07-25 ENCOUNTER — Ambulatory Visit (INDEPENDENT_AMBULATORY_CARE_PROVIDER_SITE_OTHER): Payer: Medicare Other

## 2022-07-25 VITALS — Ht 63.0 in | Wt 187.0 lb

## 2022-07-25 DIAGNOSIS — Z0000 Encounter for general adult medical examination without abnormal findings: Secondary | ICD-10-CM | POA: Diagnosis not present
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
## 2022-07-25 NOTE — Patient Instructions (Signed)
[name] , Thank you for taking time to come for your Medicare Wellness Visit. I appreciate your ongoing commitment to your health goals. Please review the following plan we discussed and let me know if I can assist you in the future.   These are the goals we discussed:  Goals       Patient Stated      Start to eat healthier, increase my physical activity, and stay involved with loving and helping my children and grandchildren.      Patient Stated (pt-stated)      My goal is to lose 20 pounds.        This is a list of the screening recommended for you and due dates:  Health Maintenance  Topic Date Due   Hepatitis C Screening: USPSTF Recommendation to screen - Ages 74-79 yoF Recommendation to screen - Ages 4-79 yo.  Never done   Zoster (Shingles) Vaccine (1 of 2) Never done   Cologuard (Stool DNA test)  Never done   Mammogram  06/04/2020   DTaP/Tdap/Td vaccine (3 - Td or Tdap) 09/27/2021   COVID-19 Vaccine (4 - 2023-24 season) 01/06/2022   Medicare Annual Wellness Visit  07/25/2023   Pneumonia Vaccine  Completed   Flu Shot  Completed   DEXA scan (bone density measurement)  Completed   HPV Vaccine  Aged Out    Advanced directives: Advance directive discussed with you today. I have provided a copy for you to complete at home and have notarized. Once this is complete please bring a copy in to our office so we can scan it into your chart.   Conditions/risks identified: Aim for 30 minutes of exercise or brisk walking, 6-8 glasses of water, and 5 servings of fruits and vegetables each day.   Next appointment: Follow up in one year for your annual wellness visit   Dawn Howard!!!!!   Preventive Care 74 Years and Older, Female Preventive care refers to lifestyle choices and visits with your health care provider that can promote health and wellness. What does preventive care include? A yearly physical exam. This is also called an annual well check. Dental exams once or twice a year. Routine eye exams. Ask your  health care provider how often you should have your eyes checked. Personal lifestyle choices, including: Daily care of your teeth and gums. Regular physical activity. Eating a healthy diet. Avoiding tobacco and drug use. Limiting alcohol use. Practicing safe sex. Taking low-dose aspirin every day. Taking vitamin and mineral supplements as recommended by your health care provider. What happens during an annual well check? The services and screenings done by your health care provider during your annual well check will depend on your age, overall health, lifestyle risk factors, and family history of disease. Counseling  Your health care provider may ask you questions about your: Alcohol use. Tobacco use. Drug use. Emotional well-being. Home and relationship well-being. Sexual activity. Eating habits. History of falls. Memory and ability to understand (cognition). Work and work Statistician. Reproductive health. Screening  You may have the following tests or measurements: Height, weight, and BMI. Blood pressure. Lipid and cholesterol levels. These may be checked every 5 years, or more frequently if you are over 52 years old. Skin check. Lung cancer screening. You may have this screening every year starting at age 46 if you have a 30-pack-year history of smoking and currently smoke or have quit within the past 15 years. Fecal occult blood test (FOBT) of the stool. You may have this test every year starting  at age 66. Flexible sigmoidoscopy or colonoscopy. You may have a sigmoidoscopy every 5 years or a colonoscopy every 10 years starting at age 20. Hepatitis C blood test. Hepatitis B blood test. Sexually transmitted disease (STD) testing. Diabetes screening. This is done by checking your blood sugar (glucose) after you have not eaten for a while (fasting). You may have this done every 1-3 years. Bone density scan. This is done to screen for osteoporosis. You may have this done  starting at age 13. Mammogram. This may be done every 1-2 years. Talk to your health care provider about how often you should have regular mammograms. Talk with your health care provider about your test results, treatment options, and if necessary, the need for more tests. Vaccines  Your health care provider may recommend certain vaccines, such as: Influenza vaccine. This is recommended every year. Tetanus, diphtheria, and acellular pertussis (Tdap, Td) vaccine. You may need a Td booster every 10 years. Zoster vaccine. You may need this after age 38. Pneumococcal 13-valent conjugate (PCV13) vaccine. One dose is recommended after age 54. Pneumococcal polysaccharide (PPSV23) vaccine. One dose is recommended after age 73. Talk to your health care provider about which screenings and vaccines you need and how often you need them. This information is not intended to replace advice given to you by your health care provider. Make sure you discuss any questions you have with your health care provider. Document Released: 05/21/2015 Document Revised: 01/12/2016 Document Reviewed: 02/23/2015 Elsevier Interactive Patient Education  2017 [employer] Prevention in the Home Falls can cause injuries. They can happen to people of all ages. There are many things you can do to make your home safe and to help prevent falls. What can I do on the outside of my home? Regularly fix the edges of walkways and driveways and fix any cracks. Remove anything that might make you trip as you walk through a door, such as a raised step or threshold. Trim any bushes or trees on the path to your home. Use bright outdoor lighting. Clear any walking paths of anything that might make someone trip, such as rocks or tools. Regularly check to see if handrails are loose or broken. Make sure that both sides of any steps have handrails. Any raised decks and porches should have guardrails on the edges. Have any leaves, snow, or  ice cleared regularly. Use sand or salt on walking paths during winter. Clean up any spills in your garage right away. This includes oil or grease spills. What can I do in the bathroom? Use night lights. Install grab bars by the toilet and in the tub and shower. Do not use towel bars as grab bars. Use non-skid mats or decals in the tub or shower. If you need to sit down in the shower, use a plastic, non-slip stool. Keep the floor dry. Clean up any water that spills on the floor as soon as it happens. Remove soap buildup in the tub or shower regularly. Attach bath mats securely with double-sided non-slip rug tape. Do not have throw rugs and other things on the floor that can make you trip. What can I do in the bedroom? Use night lights. Make sure that you have a light by your bed that is easy to reach. Do not use any sheets or blankets that are too big for your bed. They should not hang down onto the floor. Have a firm chair that has side arms. You can use this for support  while you get dressed. Do not have throw rugs and other things on the floor that can make you trip. What can I do in the kitchen? Clean up any spills right away. Avoid walking on wet floors. Keep items that you use a lot in easy-to-reach places. If you need to reach something above you, use a strong step stool that has a grab bar. Keep electrical cords out of the way. Do not use floor polish or wax that makes floors slippery. If you must use wax, use non-skid floor wax. Do not have throw rugs and other things on the floor that can make you trip. What can I do with my stairs? Do not leave any items on the stairs. Make sure that there are handrails on both sides of the stairs and use them. Fix handrails that are broken or loose. Make sure that handrails are as long as the stairways. Check any carpeting to make sure that it is firmly attached to the stairs. Fix any carpet that is loose or worn. Avoid having throw rugs at  the top or bottom of the stairs. If you do have throw rugs, attach them to the floor with carpet tape. Make sure that you have a light switch at the top of the stairs and the bottom of the stairs. If you do not have them, ask someone to add them for you. What else can I do to help prevent falls? Wear shoes that: Do not have high heels. Have rubber bottoms. Are comfortable and fit you well. Are closed at the toe. Do not wear sandals. If you use a stepladder: Make sure that it is fully opened. Do not climb a closed stepladder. Make sure that both sides of the stepladder are locked into place. Ask someone to hold it for you, if possible. Clearly mark and make sure that you can see: Any grab bars or handrails. First and last steps. Where the edge of each step is. Use tools that help you move around (mobility aids) if they are needed. These include: Canes. Walkers. Scooters. Crutches. Turn on the lights when you go into a dark area. Replace any light bulbs as soon as they burn out. Set up your furniture so you have a clear path. Avoid moving your furniture around. If any of your floors are uneven, fix them. If there are any pets around you, be aware of where they are. Review your medicines with your doctor. Some medicines can make you feel dizzy. This can increase your chance of falling. Ask your doctor what other things that you can do to help prevent falls. This information is not intended to replace advice given to you by your health care provider. Make sure you discuss any questions you have with your health care provider. Document Released: 02/18/2009 Document Revised: 09/30/2015 Document Reviewed: 05/29/2014 Elsevier Interactive Patient Education  2017 [employer].

## 2022-07-26 ENCOUNTER — Encounter: Payer: Self-pay | Admitting: Internal Medicine

## 2022-07-26 ENCOUNTER — Ambulatory Visit (INDEPENDENT_AMBULATORY_CARE_PROVIDER_SITE_OTHER): Payer: Medicare Other | Admitting: Internal Medicine

## 2022-07-26 VITALS — BP 118/78 | HR 72 | Temp 98.6°F | Ht 63.0 in | Wt 185.0 lb

## 2022-07-26 DIAGNOSIS — M25552 Pain in left hip: Secondary | ICD-10-CM

## 2022-07-26 DIAGNOSIS — N182 Chronic kidney disease, stage 2 (mild): Secondary | ICD-10-CM

## 2022-07-26 DIAGNOSIS — I10 Essential (primary) hypertension: Secondary | ICD-10-CM | POA: Diagnosis not present

## 2022-07-26 MED ORDER — METHYLPREDNISOLONE 4 MG PO TBPK: ORAL_TABLET | ORAL | 0 refills | Status: DC

## 2022-07-26 NOTE — Progress Notes (Signed)
Subjective:  Patient ID: Dawn Howard, female    DOB: 21-Dec-1950  Age: 72 y.o. MRN: [id number]  CC: Follow-up (3 MNTH F/U)   HPI Dawn Howard presents for HTN, CRI Cough is much better C/o L hip pain since Jan 2024  Outpatient Medications Prior to Visit  Medication Sig Dispense Refill   allopurinol (ZYLOPRIM) 100 MG tablet TAKE 1/2 TABLET BY MOUTH DAILY 90 tablet 3   b complex vitamins tablet Take 1 tablet by mouth daily. 100 tablet 0   carvedilol (COREG) 12.5 MG tablet TAKE  (1)  TABLET TWICE A DAY WITH MEALS (BREAKFAST AND SUPPER) 180 tablet 3   furosemide (LASIX) 20 MG tablet Take 1-2 tablets (20-40 mg total) by mouth daily as needed for edema. 30 tablet 3   HYDROcodone-acetaminophen (NORCO) 7.5-325 MG tablet Take 1 tablet by mouth 2 (two) times daily as needed. Take 1 by twice a day as needed     MEGARED OMEGA-3 KRILL OIL PO Take by mouth.     meloxicam (MOBIC) 15 MG tablet Take 1 tablet every day by oral route with meals for 30 days.     Multiple Vitamin (MULITIVITAMIN WITH MINERALS) TABS Take 1 tablet by mouth daily.     pantoprazole (PROTONIX) 40 MG tablet Take 1 tablet (40 mg total) by mouth daily. 90 tablet 3   spironolactone (ALDACTONE) 50 MG tablet Take 1 tablet (50 mg total) by mouth daily. 90 tablet 3   HYDROcodone bit-homatropine (HYCODAN) 5-1.5 MG/5ML syrup Take 5 mLs by mouth every 8 (eight) hours as needed for cough. (Patient not taking: Reported on 07/25/2022) 240 mL 0   No facility-administered medications prior to visit.    ROS: Review of Systems  Constitutional:  Negative for activity change, appetite change, chills, fatigue and unexpected weight change.  HENT:  Negative for congestion, mouth sores and sinus pressure.   Eyes:  Negative for visual disturbance.  Respiratory:  Positive for cough. Negative for chest tightness.   Gastrointestinal:  Negative for abdominal pain and nausea.  Genitourinary:  Negative for difficulty urinating, frequency and  vaginal pain.  Musculoskeletal:  Negative for back pain and gait problem.  Skin:  Negative for pallor and rash.  Neurological:  Negative for dizziness, tremors, weakness, numbness and headaches.  Psychiatric/Behavioral:  Negative for confusion and sleep disturbance.     Objective:  BP 118/78 (BP Location: Left Arm, Patient Position: Sitting, Cuff Size: Large)   Pulse 72   Temp 98.6 F (37 C) (Oral)   Ht 5\' 3"  (1.6 m)   Wt 185 lb (83.9 kg)   SpO2 95%   BMI 32.77 kg/m   BP Readings from Last 3 Encounters:  07/26/22 118/78  04/12/22 122/74  12/07/21 120/72    Wt Readings from Last 3 Encounters:  07/26/22 185 lb (83.9 kg)  07/25/22 187 lb (84.8 kg)  04/12/22 187 lb (84.8 kg)    Physical Exam Constitutional:      General: She is not in acute distress.    Appearance: Normal appearance. She is well-developed.  HENT:     Head: Normocephalic.     Right Ear: External ear normal.     Left Ear: External ear normal.     Nose: Nose normal.  Eyes:     General:        Right eye: No discharge.        Left eye: No discharge.     Conjunctiva/sclera: Conjunctivae normal.     Pupils: Pupils are  equal, round, and reactive to light.  Neck:     Thyroid: No thyromegaly.     Vascular: No JVD.     Trachea: No tracheal deviation.  Cardiovascular:     Rate and Rhythm: Normal rate and regular rhythm.     Heart sounds: Normal heart sounds.  Pulmonary:     Effort: No respiratory distress.     Breath sounds: No stridor. No wheezing.  Abdominal:     General: Bowel sounds are normal. There is no distension.     Palpations: Abdomen is soft. There is no mass.     Tenderness: There is no abdominal tenderness. There is no guarding or rebound.  Musculoskeletal:        General: No tenderness.     Cervical back: Normal range of motion and neck supple. No rigidity.  Lymphadenopathy:     Cervical: No cervical adenopathy.  Skin:    Findings: No erythema or rash.  Neurological:     Cranial  Nerves: No cranial nerve deficit.     Motor: No abnormal muscle tone.     Coordination: Coordination normal.     Deep Tendon Reflexes: Reflexes normal.  Psychiatric:        Behavior: Behavior normal.        Thought Content: Thought content normal.        Judgment: Judgment normal.   L hip pain - pelvic prominence  Lab Results  Component Value Date   WBC 7.2 04/12/2022   HGB 15.7 (H) 04/12/2022   HCT 46.2 (H) 04/12/2022   PLT 285.0 04/12/2022   GLUCOSE 83 04/12/2022   CHOL 215 (H) 04/12/2022   TRIG 199.0 (H) 04/12/2022   HDL 47.70 04/12/2022   LDLDIRECT 160.0 01/02/2018   LDLCALC 128 (H) 04/12/2022   ALT 25 04/12/2022   AST 20 04/12/2022   NA 135 04/12/2022   K 4.0 04/12/2022   CL 104 04/12/2022   CREATININE 1.27 (H) 04/12/2022   BUN 24 (H) 04/12/2022   CO2 25 04/12/2022   TSH 1.96 04/12/2022   HGBA1C 5.3 11/17/2020    US Abdomen Complete  Result Date: 12/24/2019 CLINICAL DATA:  Intermittent left upper quadrant pain. EXAM: ABDOMEN ULTRASOUND COMPLETE COMPARISON:  Abdominal ultrasound dated February 18, 2014. FINDINGS: Gallbladder: No gallstones or wall thickening visualized. No sonographic Murphy sign noted by sonographer. Common bile duct: Diameter: 4 mm, normal. Liver: Unchanged 2.8 cm simple cyst in the right liver. 1.2 cm simple cyst in the left liver, not seen on the prior study. Patchy increased parenchymal echogenicity. Portal vein is patent on color Doppler imaging with normal direction of blood flow towards the liver. IVC: No abnormality visualized. Pancreas: Visualized portion unremarkable. Spleen: Size and appearance within normal limits. Right Kidney: Length: 10.1 cm. Echogenicity within normal limits. No mass or hydronephrosis visualized. 1.7 cm simple cyst. Left Kidney: Length: 10.4 cm. Echogenicity within normal limits. No mass or hydronephrosis visualized. 1.5 cm simple cyst. Abdominal aorta: No aneurysm visualized. Other findings: None. IMPRESSION: 1. No acute  abnormality. 2. Patchy increased parenchymal echogenicity of the liver, nonspecific but could represent steatosis or underlying hepatocellular disease. Correlate with LFTs. 3. SImple hepatic and bilateral renal cysts. Electronically Signed   By: Titus Dubin M.D.   On: 12/24/2019 16:40    Assessment & Plan:   Problem List Items Addressed This Visit       Cardiovascular and Mediastinum   Essential hypertension - Primary    F/u w/Dr Johnney Ou. Chronic   Cont on  Coreg, Spironolactone        Genitourinary   CRF (chronic renal failure), stage 2 (mild)    Hydrate well        Other   Pain of left hip    L hip - ?iliopsoas bursitis Blue-Emu cream - use 2-3 times a day Hip opener exercises X ray Medrol pack      Relevant Orders   XR HIP UNILAT W OR W/O PELVIS 2-3 VIEWS LEFT      Meds ordered this encounter  Medications   methylPREDNISolone (MEDROL DOSEPAK) 4 MG TBPK tablet    Sig: As directed    Dispense:  21 tablet    Refill:  0      Follow-up: Return in about 6 weeks (around 09/06/2022) for a follow-up visit.  Walker Kehr, MD

## 2022-07-26 NOTE — Assessment & Plan Note (Signed)
F/u w/Dr Johnney Ou. Chronic   Cont on Coreg, Spironolactone

## 2022-07-26 NOTE — Assessment & Plan Note (Signed)
Hydrate well 

## 2022-07-26 NOTE — Patient Instructions (Addendum)
Blue-Emu cream - use 2-3 times a day  Hip opener exercises

## 2022-07-26 NOTE — Assessment & Plan Note (Signed)
L hip - ?iliopsoas bursitis Blue-Emu cream - use 2-3 times a day Hip opener exercises X ray Medrol pack

## 2022-08-01 ENCOUNTER — Ambulatory Visit (INDEPENDENT_AMBULATORY_CARE_PROVIDER_SITE_OTHER): Payer: Medicare Other

## 2022-08-01 DIAGNOSIS — M25552 Pain in left hip: Secondary | ICD-10-CM

## 2022-08-31 ENCOUNTER — Ambulatory Visit: Payer: Medicare Other | Admitting: Internal Medicine

## 2022-09-07 ENCOUNTER — Ambulatory Visit: Payer: Medicare Other | Admitting: Internal Medicine

## 2022-09-14 ENCOUNTER — Ambulatory Visit: Payer: Medicare Other | Admitting: Internal Medicine

## 2022-09-19 DIAGNOSIS — M7062 Trochanteric bursitis, left hip: Secondary | ICD-10-CM | POA: Insufficient documentation

## 2022-10-31 ENCOUNTER — Other Ambulatory Visit: Payer: Self-pay | Admitting: Internal Medicine

## 2022-12-06 ENCOUNTER — Encounter (INDEPENDENT_AMBULATORY_CARE_PROVIDER_SITE_OTHER): Payer: Self-pay

## 2022-12-28 ENCOUNTER — Ambulatory Visit: Payer: Medicare Other | Admitting: Internal Medicine

## 2023-01-04 ENCOUNTER — Encounter: Payer: Self-pay | Admitting: Internal Medicine

## 2023-01-04 ENCOUNTER — Ambulatory Visit (INDEPENDENT_AMBULATORY_CARE_PROVIDER_SITE_OTHER): Payer: Medicare Other | Admitting: Internal Medicine

## 2023-01-04 VITALS — BP 110/70 | HR 90 | Temp 98.3°F | Ht 63.0 in

## 2023-01-04 DIAGNOSIS — F419 Anxiety disorder, unspecified: Secondary | ICD-10-CM | POA: Diagnosis not present

## 2023-01-04 DIAGNOSIS — E785 Hyperlipidemia, unspecified: Secondary | ICD-10-CM | POA: Diagnosis not present

## 2023-01-04 DIAGNOSIS — R1310 Dysphagia, unspecified: Secondary | ICD-10-CM | POA: Diagnosis not present

## 2023-01-04 DIAGNOSIS — N182 Chronic kidney disease, stage 2 (mild): Secondary | ICD-10-CM | POA: Diagnosis not present

## 2023-01-04 DIAGNOSIS — G4700 Insomnia, unspecified: Secondary | ICD-10-CM

## 2023-01-04 MED ORDER — FUROSEMIDE 20 MG PO TABS: 20.0000 mg | ORAL_TABLET | Freq: Every day | ORAL | 3 refills | Status: DC | PRN

## 2023-01-04 MED ORDER — CARVEDILOL 12.5 MG PO TABS: ORAL_TABLET | ORAL | 3 refills | Status: DC

## 2023-01-04 MED ORDER — SPIRONOLACTONE 50 MG PO TABS: 50.0000 mg | ORAL_TABLET | Freq: Every day | ORAL | 3 refills | Status: DC

## 2023-01-04 MED ORDER — ALLOPURINOL 100 MG PO TABS: 50.0000 mg | ORAL_TABLET | Freq: Every day | ORAL | 3 refills | Status: DC
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
## 2023-01-11 ENCOUNTER — Encounter: Payer: Self-pay | Admitting: Internal Medicine

## 2023-01-11 NOTE — Assessment & Plan Note (Addendum)
Follow-up with Dr. Glenna Fellows.  Hydrate well

## 2023-01-11 NOTE — Assessment & Plan Note (Signed)
No OSA 

## 2023-01-11 NOTE — Assessment & Plan Note (Signed)
Follow-up with Dr.Nandigam if problems

## 2023-01-26 ENCOUNTER — Other Ambulatory Visit (INDEPENDENT_AMBULATORY_CARE_PROVIDER_SITE_OTHER): Payer: Medicare Other

## 2023-01-26 DIAGNOSIS — E785 Hyperlipidemia, unspecified: Secondary | ICD-10-CM | POA: Diagnosis not present

## 2023-01-26 DIAGNOSIS — N182 Chronic kidney disease, stage 2 (mild): Secondary | ICD-10-CM | POA: Diagnosis not present

## 2023-01-26 LAB — URINALYSIS, ROUTINE W REFLEX MICROSCOPIC
Bilirubin Urine: NEGATIVE
Ketones, ur: NEGATIVE
Nitrite: NEGATIVE
Specific Gravity, Urine: 1.02 (ref 1.000–1.030)
Total Protein, Urine: NEGATIVE
Urine Glucose: NEGATIVE
Urobilinogen, UA: 0.2 (ref 0.0–1.0)
pH: 5.5 (ref 5.0–8.0)

## 2023-01-26 LAB — CBC WITH DIFFERENTIAL/PLATELET
Basophils Absolute: 0.1 10*3/uL (ref 0.0–0.1)
Basophils Relative: 1 % (ref 0.0–3.0)
Eosinophils Absolute: 0.2 10*3/uL (ref 0.0–0.7)
Eosinophils Relative: 2.7 % (ref 0.0–5.0)
HCT: 43.8 % (ref 36.0–46.0)
Hemoglobin: 14.7 g/dL (ref 12.0–15.0)
Lymphocytes Relative: 27.7 % (ref 12.0–46.0)
Lymphs Abs: 2.2 10*3/uL (ref 0.7–4.0)
MCHC: 33.6 g/dL (ref 30.0–36.0)
MCV: 89.6 fl (ref 78.0–100.0)
Monocytes Absolute: 0.6 10*3/uL (ref 0.1–1.0)
Monocytes Relative: 7.1 % (ref 3.0–12.0)
Neutro Abs: 4.9 10*3/uL (ref 1.4–7.7)
Neutrophils Relative %: 61.5 % (ref 43.0–77.0)
Platelets: 253 10*3/uL (ref 150.0–400.0)
RBC: 4.89 Mil/uL (ref 3.87–5.11)
RDW: 13.1 % (ref 11.5–15.5)
WBC: 8 10*3/uL (ref 4.0–10.5)

## 2023-01-26 LAB — COMPREHENSIVE METABOLIC PANEL
ALT: 26 U/L (ref 0–35)
AST: 17 U/L (ref 0–37)
Albumin: 3.9 g/dL (ref 3.5–5.2)
Alkaline Phosphatase: 90 U/L (ref 39–117)
BUN: 26 mg/dL — ABNORMAL HIGH (ref 6–23)
CO2: 25 mEq/L (ref 19–32)
Calcium: 9.8 mg/dL (ref 8.4–10.5)
Chloride: 104 mEq/L (ref 96–112)
Creatinine, Ser: 1.12 mg/dL (ref 0.40–1.20)
GFR: 48.44 mL/min — ABNORMAL LOW (ref >60.00)
Glucose, Bld: 87 mg/dL (ref 70–99)
Potassium: 3.7 mEq/L (ref 3.5–5.1)
Sodium: 138 mEq/L (ref 135–145)
Total Bilirubin: 0.5 mg/dL (ref 0.2–1.2)
Total Protein: 6.8 g/dL (ref 6.0–8.3)

## 2023-01-26 LAB — LIPID PANEL
Cholesterol: 192 mg/dL (ref 0–200)
HDL: 49.7 mg/dL (ref >39.00)
LDL Cholesterol: 109 mg/dL — ABNORMAL HIGH (ref 0–99)
NonHDL: 141.99
Total CHOL/HDL Ratio: 4
Triglycerides: 164 mg/dL — ABNORMAL HIGH (ref 0.0–149.0)
VLDL: 32.8 mg/dL (ref 0.0–40.0)

## 2023-01-26 LAB — TSH: TSH: 2.37 u[IU]/mL (ref 0.35–5.50)

## 2023-01-30 NOTE — Addendum Note (Signed)
Addended by: Delsa Grana R on: 01/30/2023 03:46 PM   Modules accepted: Orders

## 2023-01-31 ENCOUNTER — Ambulatory Visit: Payer: Medicare Other

## 2023-01-31 DIAGNOSIS — N182 Chronic kidney disease, stage 2 (mild): Secondary | ICD-10-CM

## 2023-02-03 LAB — URINE CULTURE
MICRO NUMBER:: 15514474
SPECIMEN QUALITY:: ADEQUATE

## 2023-02-05 ENCOUNTER — Other Ambulatory Visit: Payer: Self-pay | Admitting: Internal Medicine

## 2023-02-05 MED ORDER — NITROFURANTOIN MONOHYD MACRO 100 MG PO CAPS: 100.0000 mg | ORAL_CAPSULE | Freq: Two times a day (BID) | ORAL | 0 refills | Status: DC

## 2023-03-05 ENCOUNTER — Telehealth: Payer: Self-pay | Admitting: Internal Medicine

## 2023-03-05 NOTE — Telephone Encounter (Signed)
Patient called and said that her cough is not getting better - please send her cough syrup in to  [facility] in [place] - Patient states that Dr. Macario Golds told her daughter last week for her to just call and request this.

## 2023-03-12 NOTE — Telephone Encounter (Signed)
Use Delsym or Robitussin over-the-counter.  Prescription cough meds could affect her QT prolongation.  Office visit if problems thanks

## 2023-03-13 NOTE — Telephone Encounter (Signed)
I was able to inform the pt of Dr. Loren Racer instructions... Pt stated understanding and will try the meds to see if it helps.

## 2023-03-30 ENCOUNTER — Other Ambulatory Visit (HOSPITAL_COMMUNITY): Payer: Self-pay

## 2023-05-25 ENCOUNTER — Ambulatory Visit: Payer: Medicare Other | Admitting: Family Medicine

## 2023-05-25 ENCOUNTER — Ambulatory Visit (INDEPENDENT_AMBULATORY_CARE_PROVIDER_SITE_OTHER): Payer: Medicare Other

## 2023-05-25 ENCOUNTER — Encounter: Payer: Self-pay | Admitting: Family Medicine

## 2023-05-25 VITALS — BP 122/76 | HR 80 | Temp 97.6°F | Ht 63.0 in | Wt 173.0 lb

## 2023-05-25 DIAGNOSIS — R1011 Right upper quadrant pain: Secondary | ICD-10-CM | POA: Diagnosis not present

## 2023-05-25 DIAGNOSIS — R0789 Other chest pain: Secondary | ICD-10-CM | POA: Diagnosis not present

## 2023-05-25 DIAGNOSIS — R051 Acute cough: Secondary | ICD-10-CM | POA: Diagnosis not present

## 2023-05-25 DIAGNOSIS — R1310 Dysphagia, unspecified: Secondary | ICD-10-CM | POA: Diagnosis not present

## 2023-05-25 DIAGNOSIS — Z9889 Other specified postprocedural states: Secondary | ICD-10-CM

## 2023-05-25 LAB — BASIC METABOLIC PANEL
BUN: 20 mg/dL (ref 6–23)
CO2: 27 meq/L (ref 19–32)
Calcium: 10.4 mg/dL (ref 8.4–10.5)
Chloride: 102 meq/L (ref 96–112)
Creatinine, Ser: 0.99 mg/dL (ref 0.40–1.20)
GFR: 56.05 mL/min — ABNORMAL LOW (ref >60.00)
Glucose, Bld: 88 mg/dL (ref 70–99)
Potassium: 3.8 meq/L (ref 3.5–5.1)
Sodium: 138 meq/L (ref 135–145)

## 2023-05-25 LAB — CBC WITH DIFFERENTIAL/PLATELET
Basophils Absolute: 0.1 10*3/uL (ref 0.0–0.1)
Basophils Relative: 0.9 % (ref 0.0–3.0)
Eosinophils Absolute: 0.2 10*3/uL (ref 0.0–0.7)
Eosinophils Relative: 2.3 % (ref 0.0–5.0)
HCT: 45.8 % (ref 36.0–46.0)
Hemoglobin: 15.3 g/dL — ABNORMAL HIGH (ref 12.0–15.0)
Lymphocytes Relative: 24.7 % (ref 12.0–46.0)
Lymphs Abs: 1.8 10*3/uL (ref 0.7–4.0)
MCHC: 33.3 g/dL (ref 30.0–36.0)
MCV: 89.6 fL (ref 78.0–100.0)
Monocytes Absolute: 0.6 10*3/uL (ref 0.1–1.0)
Monocytes Relative: 8.2 % (ref 3.0–12.0)
Neutro Abs: 4.8 10*3/uL (ref 1.4–7.7)
Neutrophils Relative %: 63.9 % (ref 43.0–77.0)
Platelets: 297 10*3/uL (ref 150.0–400.0)
RBC: 5.11 Mil/uL (ref 3.87–5.11)
RDW: 13.2 % (ref 11.5–15.5)
WBC: 7.5 10*3/uL (ref 4.0–10.5)

## 2023-05-25 LAB — HEPATIC FUNCTION PANEL
ALT: 30 U/L (ref 0–35)
AST: 22 U/L (ref 0–37)
Albumin: 4.6 g/dL (ref 3.5–5.2)
Alkaline Phosphatase: 106 U/L (ref 39–117)
Bilirubin, Direct: 0.1 mg/dL (ref 0.0–0.3)
Total Bilirubin: 0.7 mg/dL (ref 0.2–1.2)
Total Protein: 7.8 g/dL (ref 6.0–8.3)

## 2023-05-25 LAB — LIPASE: Lipase: 40 U/L (ref 11.0–59.0)
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
## 2023-05-25 NOTE — Patient Instructions (Signed)
Please go downstairs for labs and a chest X ray.   I will be in touch with your results.   I also referred you to [facility]  Follow up with Dr. Posey Rea if your symptoms persist.

## 2023-05-28 ENCOUNTER — Encounter: Payer: Self-pay | Admitting: Family Medicine

## 2023-07-12 ENCOUNTER — Ambulatory Visit: Payer: Medicare Other | Admitting: Internal Medicine

## 2023-07-16 ENCOUNTER — Ambulatory Visit: Payer: Medicare Other | Admitting: Internal Medicine

## 2023-07-16 ENCOUNTER — Encounter: Payer: Self-pay | Admitting: Internal Medicine

## 2023-07-16 VITALS — BP 118/70 | HR 69 | Temp 98.2°F | Ht 63.0 in | Wt 171.0 lb

## 2023-07-16 DIAGNOSIS — R5383 Other fatigue: Secondary | ICD-10-CM

## 2023-07-16 DIAGNOSIS — I10 Essential (primary) hypertension: Secondary | ICD-10-CM | POA: Diagnosis not present

## 2023-07-16 DIAGNOSIS — R202 Paresthesia of skin: Secondary | ICD-10-CM

## 2023-07-16 DIAGNOSIS — R002 Palpitations: Secondary | ICD-10-CM | POA: Diagnosis not present

## 2023-07-16 DIAGNOSIS — D751 Secondary polycythemia: Secondary | ICD-10-CM | POA: Diagnosis not present

## 2023-07-16 LAB — URINALYSIS
Bilirubin Urine: NEGATIVE
Hgb urine dipstick: NEGATIVE
Ketones, ur: NEGATIVE
Leukocytes,Ua: NEGATIVE
Nitrite: NEGATIVE
Specific Gravity, Urine: 1.015 (ref 1.000–1.030)
Total Protein, Urine: NEGATIVE
Urine Glucose: NEGATIVE
Urobilinogen, UA: 0.2 (ref 0.0–1.0)
pH: 6 (ref 5.0–8.0)

## 2023-07-16 LAB — COMPREHENSIVE METABOLIC PANEL
ALT: 28 U/L (ref 0–35)
AST: 22 U/L (ref 0–37)
Albumin: 4.2 g/dL (ref 3.5–5.2)
Alkaline Phosphatase: 100 U/L (ref 39–117)
BUN: 19 mg/dL (ref 6–23)
CO2: 26 meq/L (ref 19–32)
Calcium: 10.1 mg/dL (ref 8.4–10.5)
Chloride: 105 meq/L (ref 96–112)
Creatinine, Ser: 0.95 mg/dL (ref 0.40–1.20)
GFR: 58.83 mL/min — ABNORMAL LOW (ref >60.00)
Glucose, Bld: 83 mg/dL (ref 70–99)
Potassium: 4.1 meq/L (ref 3.5–5.1)
Sodium: 139 meq/L (ref 135–145)
Total Bilirubin: 0.6 mg/dL (ref 0.2–1.2)
Total Protein: 7.1 g/dL (ref 6.0–8.3)

## 2023-07-16 LAB — T4, FREE: Free T4: 0.85 ng/dL (ref 0.60–1.60)

## 2023-07-16 LAB — VITAMIN B12: Vitamin B-12: 218 pg/mL (ref 211–911)

## 2023-07-16 LAB — TSH: TSH: 1.34 u[IU]/mL (ref 0.35–5.50)

## 2023-07-16 MED ORDER — SPIRONOLACTONE 25 MG PO TABS: 25.0000 mg | ORAL_TABLET | Freq: Every day | ORAL | 3 refills | Status: DC

## 2023-07-16 MED ORDER — CARVEDILOL 12.5 MG PO TABS: 6.2500 mg | ORAL_TABLET | Freq: Two times a day (BID) | ORAL | Status: DC

## 2023-07-16 MED ORDER — CARVEDILOL 6.25 MG PO TABS: 6.2500 mg | ORAL_TABLET | Freq: Two times a day (BID) | ORAL | 3 refills | Status: DC

## 2023-07-16 MED ORDER — SPIRONOLACTONE 50 MG PO TABS: 25.0000 mg | ORAL_TABLET | Freq: Every day | ORAL | Status: DC

## 2023-07-16 NOTE — Assessment & Plan Note (Signed)
 Mild Last CBC was ok

## 2023-07-16 NOTE — Assessment & Plan Note (Addendum)
 Low BP Reduce Spironolactone, Coreg Labs w/UA, TSH

## 2023-07-16 NOTE — Assessment & Plan Note (Signed)
No relapse 

## 2023-07-16 NOTE — Assessment & Plan Note (Signed)
 Low BP Reduce Spironolactone, Coreg

## 2023-07-16 NOTE — Progress Notes (Signed)
 Subjective:  Patient ID: Dawn Howard, female    DOB: 20-Aug-1950  Age: 73 y.o. MRN: [id number]  CC: Medical Management of Chronic Issues (6 mnth f/u)   HPI Dawn Howard presents for gout, HTN, dyslipidemia C/o fatigue, dizziness Low BP  Outpatient Medications Prior to Visit  Medication Sig Dispense Refill   allopurinol (ZYLOPRIM) 100 MG tablet Take 0.5 tablets (50 mg total) by mouth daily. 90 tablet 3   HYDROcodone-acetaminophen (NORCO) 7.5-325 MG tablet Take 1 tablet by mouth 2 (two) times daily as needed. Take 1 by twice a day as needed     MEGARED OMEGA-3 KRILL OIL PO Take by mouth.     carvedilol (COREG) 12.5 MG tablet TAKE  (1)  TABLET TWICE A DAY WITH MEALS (BREAKFAST AND SUPPER) 180 tablet 3   furosemide (LASIX) 20 MG tablet Take 1-2 tablets (20-40 mg total) by mouth daily as needed for edema. 30 tablet 3   spironolactone (ALDACTONE) 50 MG tablet Take 1 tablet (50 mg total) by mouth daily. 90 tablet 3   No facility-administered medications prior to visit.    ROS: Review of Systems  Constitutional:  Positive for fatigue. Negative for activity change, appetite change, chills and unexpected weight change.  HENT:  Negative for congestion, mouth sores and sinus pressure.   Eyes:  Negative for visual disturbance.  Respiratory:  Negative for cough and chest tightness.   Gastrointestinal:  Negative for abdominal pain and nausea.  Genitourinary:  Negative for difficulty urinating, frequency and vaginal pain.  Musculoskeletal:  Negative for back pain and gait problem.  Skin:  Negative for pallor and rash.  Neurological:  Positive for dizziness and light-headedness. Negative for tremors, weakness, numbness and headaches.  Psychiatric/Behavioral:  Negative for confusion, sleep disturbance and suicidal ideas. The patient is not nervous/anxious.     Objective:  BP 118/70   Pulse 69   Temp 98.2 F (36.8 C) (Oral)   Ht 5\' 3"  (1.6 m)   Wt 171 lb (77.6 kg)   SpO2 97%   BMI  30.29 kg/m   BP Readings from Last 3 Encounters:  07/16/23 118/70  05/25/23 122/76  01/04/23 110/70    Wt Readings from Last 3 Encounters:  07/16/23 171 lb (77.6 kg)  05/25/23 173 lb (78.5 kg)  07/26/22 185 lb (83.9 kg)    Physical Exam Constitutional:      General: She is not in acute distress.    Appearance: Normal appearance. She is well-developed.  HENT:     Head: Normocephalic.     Right Ear: External ear normal.     Left Ear: External ear normal.     Nose: Nose normal.  Eyes:     General:        Right eye: No discharge.        Left eye: No discharge.     Conjunctiva/sclera: Conjunctivae normal.     Pupils: Pupils are equal, round, and reactive to light.  Neck:     Thyroid: No thyromegaly.     Vascular: No JVD.     Trachea: No tracheal deviation.  Cardiovascular:     Rate and Rhythm: Normal rate and regular rhythm.     Heart sounds: Normal heart sounds.  Pulmonary:     Effort: No respiratory distress.     Breath sounds: No stridor. No wheezing.  Abdominal:     General: Bowel sounds are normal. There is no distension.     Palpations: Abdomen is soft. There is  no mass.     Tenderness: There is no abdominal tenderness. There is no guarding or rebound.  Musculoskeletal:        General: No tenderness.     Cervical back: Normal range of motion and neck supple. No rigidity.     Right lower leg: No edema.     Left lower leg: No edema.  Lymphadenopathy:     Cervical: No cervical adenopathy.  Skin:    Findings: No erythema or rash.  Neurological:     Mental Status: She is oriented to person, place, and time.     Cranial Nerves: No cranial nerve deficit.     Motor: No abnormal muscle tone.     Coordination: Coordination normal.     Deep Tendon Reflexes: Reflexes normal.  Psychiatric:        Behavior: Behavior normal.        Thought Content: Thought content normal.        Judgment: Judgment normal.     Lab Results  Component Value Date   WBC 7.5 05/25/2023    HGB 15.3 (H) 05/25/2023   HCT 45.8 05/25/2023   PLT 297.0 05/25/2023   GLUCOSE 88 05/25/2023   CHOL 192 01/26/2023   TRIG 164.0 (H) 01/26/2023   HDL 49.70 01/26/2023   LDLDIRECT 160.0 01/02/2018   LDLCALC 109 (H) 01/26/2023   ALT 30 05/25/2023   AST 22 05/25/2023   NA 138 05/25/2023   K 3.8 05/25/2023   CL 102 05/25/2023   CREATININE 0.99 05/25/2023   BUN 20 05/25/2023   CO2 27 05/25/2023   TSH 2.37 01/26/2023   HGBA1C 5.3 11/17/2020    US Abdomen Complete Result Date: 12/24/2019 CLINICAL DATA:  Intermittent left upper quadrant pain. EXAM: ABDOMEN ULTRASOUND COMPLETE COMPARISON:  Abdominal ultrasound dated February 18, 2014. FINDINGS: Gallbladder: No gallstones or wall thickening visualized. No sonographic Murphy sign noted by sonographer. Common bile duct: Diameter: 4 mm, normal. Liver: Unchanged 2.8 cm simple cyst in the right liver. 1.2 cm simple cyst in the left liver, not seen on the prior study. Patchy increased parenchymal echogenicity. Portal vein is patent on color Doppler imaging with normal direction of blood flow towards the liver. IVC: No abnormality visualized. Pancreas: Visualized portion unremarkable. Spleen: Size and appearance within normal limits. Right Kidney: Length: 10.1 cm. Echogenicity within normal limits. No mass or hydronephrosis visualized. 1.7 cm simple cyst. Left Kidney: Length: 10.4 cm. Echogenicity within normal limits. No mass or hydronephrosis visualized. 1.5 cm simple cyst. Abdominal aorta: No aneurysm visualized. Other findings: None. IMPRESSION: 1. No acute abnormality. 2. Patchy increased parenchymal echogenicity of the liver, nonspecific but could represent steatosis or underlying hepatocellular disease. Correlate with LFTs. 3. SImple hepatic and bilateral renal cysts. Electronically Signed   By: Obie Dredge M.D.   On: 12/24/2019 16:40    Assessment & Plan:   Problem List Items Addressed This Visit     Essential hypertension   Low BP Reduce  Spironolactone, Coreg      Relevant Medications   spironolactone (ALDACTONE) 25 MG tablet   carvedilol (COREG) 6.25 MG tablet   Other Relevant Orders   Vitamin B12   Urinalysis   Polycythemia   Mild Last CBC was ok      Fatigue - Primary   Low BP Reduce Spironolactone, Coreg Labs w/UA, TSH      Relevant Orders   Comprehensive metabolic panel   TSH   T4, free   Vitamin B12   Urinalysis  Palpitations   No relapse      Relevant Orders   Urinalysis   Other Visit Diagnoses       Paresthesia             Meds ordered this encounter  Medications   DISCONTD: carvedilol (COREG) 12.5 MG tablet    Sig: Take 0.5 tablets (6.25 mg total) by mouth 2 (two) times daily with a meal.   DISCONTD: spironolactone (ALDACTONE) 50 MG tablet    Sig: Take 0.5 tablets (25 mg total) by mouth daily.   spironolactone (ALDACTONE) 25 MG tablet    Sig: Take 1 tablet (25 mg total) by mouth daily.    Dispense:  90 tablet    Refill:  3   carvedilol (COREG) 6.25 MG tablet    Sig: Take 1 tablet (6.25 mg total) by mouth 2 (two) times daily with a meal.    Dispense:  180 tablet    Refill:  3      Follow-up: Return in about 4 weeks (around 08/13/2023) for a follow-up visit.  Sonda Primes, MD

## 2023-07-31 ENCOUNTER — Ambulatory Visit (INDEPENDENT_AMBULATORY_CARE_PROVIDER_SITE_OTHER): Payer: Medicare Other

## 2023-07-31 VITALS — Ht 63.0 in | Wt 165.0 lb

## 2023-07-31 DIAGNOSIS — Z780 Asymptomatic menopausal state: Secondary | ICD-10-CM | POA: Diagnosis not present

## 2023-07-31 DIAGNOSIS — M8580 Other specified disorders of bone density and structure, unspecified site: Secondary | ICD-10-CM | POA: Diagnosis not present

## 2023-07-31 DIAGNOSIS — Z1231 Encounter for screening mammogram for malignant neoplasm of breast: Secondary | ICD-10-CM

## 2023-07-31 DIAGNOSIS — Z0000 Encounter for general adult medical examination without abnormal findings: Secondary | ICD-10-CM | POA: Diagnosis not present
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
## 2023-07-31 NOTE — Patient Instructions (Addendum)
 [name] , Thank you for taking time to come for your Medicare Wellness Visit. I appreciate your ongoing commitment to your health goals. Please review the following plan we discussed and let me know if I can assist you in the future.   Referrals/Orders/Follow-Ups/Clinician Recommendations: It was nice talking to you.  You are due for a Tetanus and a Shingles vaccine.  Each day, aim for 6 glasses of water, plenty of protein in your diet and try to get up and walk/ stretch every hour for 5-10 minutes at a time.  You have an order for:   [x]   3D Mammogram  [x]   Bone Density    Please call for appointment:  [facility] [address] [place], [place] [address] [phone]  Make sure to wear two-piece clothing.  No lotions, powders, or deodorants the day of the appointment. Make sure to bring picture ID and insurance card.  Bring list of medications you are currently taking including any supplements.    This is a list of the screening recommended for you and due dates:  Health Maintenance  Topic Date Due   Hepatitis C Screening  Never done   DTaP/Tdap/Td vaccine (3 - Td or Tdap) 09/27/2021   Flu Shot  12/07/2022   COVID-19 Vaccine (4 - 2024-25 season) 01/07/2023   Medicare Annual Wellness Visit  07/25/2023   Colon Cancer Screening  05/13/2030   Pneumonia Vaccine  Completed   DEXA scan (bone density measurement)  Completed   HPV Vaccine  Aged Out   Zoster (Shingles) Vaccine  Discontinued    Advanced directives: (Declined) Advance directive discussed with you today. Even though you declined this today, please call our office should you change your mind, and we can give you the proper paperwork for you to fill out.  Next Medicare Annual Wellness Visit scheduled for next year: Yes

## 2023-08-14 ENCOUNTER — Ambulatory Visit: Admitting: Internal Medicine

## 2023-08-14 ENCOUNTER — Encounter: Payer: Self-pay | Admitting: Internal Medicine

## 2023-08-14 VITALS — BP 104/76 | HR 88 | Temp 97.8°F | Ht 63.0 in | Wt 164.0 lb

## 2023-08-14 DIAGNOSIS — R5383 Other fatigue: Secondary | ICD-10-CM | POA: Diagnosis not present

## 2023-08-14 DIAGNOSIS — E876 Hypokalemia: Secondary | ICD-10-CM

## 2023-08-14 DIAGNOSIS — M5440 Lumbago with sciatica, unspecified side: Secondary | ICD-10-CM | POA: Diagnosis not present

## 2023-08-14 DIAGNOSIS — R0683 Snoring: Secondary | ICD-10-CM

## 2023-08-14 DIAGNOSIS — F419 Anxiety disorder, unspecified: Secondary | ICD-10-CM | POA: Diagnosis not present

## 2023-08-14 DIAGNOSIS — G8929 Other chronic pain: Secondary | ICD-10-CM

## 2023-08-14 DIAGNOSIS — E559 Vitamin D deficiency, unspecified: Secondary | ICD-10-CM

## 2023-08-14 DIAGNOSIS — G4700 Insomnia, unspecified: Secondary | ICD-10-CM

## 2023-08-14 DIAGNOSIS — I10 Essential (primary) hypertension: Secondary | ICD-10-CM

## 2023-08-14 MED ORDER — CARVEDILOL 3.125 MG PO TABS: 3.1250 mg | ORAL_TABLET | Freq: Two times a day (BID) | ORAL | 3 refills | Status: DC

## 2023-08-14 MED ORDER — B COMPLEX VITAMINS PO CAPS: 1.0000 | ORAL_CAPSULE | Freq: Every day | ORAL | 3 refills | Status: AC

## 2023-08-14 NOTE — Assessment & Plan Note (Signed)
No OSA 

## 2023-08-14 NOTE — Assessment & Plan Note (Signed)
 On Vit D

## 2023-08-14 NOTE — Assessment & Plan Note (Signed)
 On Spironolactone Check labs

## 2023-08-14 NOTE — Assessment & Plan Note (Signed)
  Xanax prn - very rare use  Potential benefits of a long term benzodiazepines  use as well as potential risks  and complications were explained to the patient and were aknowledged.

## 2023-08-14 NOTE — Assessment & Plan Note (Addendum)
 Low BP Reduce or d/c Coreg On Spironolactone

## 2023-08-14 NOTE — Progress Notes (Signed)
 Subjective:  Patient ID: Dawn Howard, female    DOB: Aug 20, 1950  Age: 73 y.o. MRN: [id number]  CC: Follow-up (4 weeks )   HPI Dawn Howard presents for allergies, gout, fatigue Watching 3 kids  Outpatient Medications Prior to Visit  Medication Sig Dispense Refill   allopurinol (ZYLOPRIM) 100 MG tablet Take 0.5 tablets (50 mg total) by mouth daily. 90 tablet 3   HYDROcodone-acetaminophen (NORCO) 7.5-325 MG tablet Take 1 tablet by mouth 2 (two) times daily as needed. Take 1 by twice a day as needed     MEGARED OMEGA-3 KRILL OIL PO Take by mouth.     spironolactone (ALDACTONE) 25 MG tablet Take 1 tablet (25 mg total) by mouth daily. 90 tablet 3   carvedilol (COREG) 6.25 MG tablet Take 1 tablet (6.25 mg total) by mouth 2 (two) times daily with a meal. 180 tablet 3   No facility-administered medications prior to visit.    ROS: Review of Systems  Constitutional:  Positive for fatigue. Negative for activity change, appetite change, chills and unexpected weight change.  HENT:  Negative for congestion, mouth sores and sinus pressure.   Eyes:  Negative for visual disturbance.  Respiratory:  Negative for cough and chest tightness.   Gastrointestinal:  Negative for abdominal pain and nausea.  Genitourinary:  Negative for difficulty urinating, frequency and vaginal pain.  Musculoskeletal:  Negative for back pain and gait problem.  Skin:  Negative for pallor and rash.  Neurological:  Negative for dizziness, tremors, weakness, numbness and headaches.  Psychiatric/Behavioral:  Positive for sleep disturbance. Negative for confusion, decreased concentration and suicidal ideas.     Objective:  BP 104/76 (BP Location: Left Arm, Patient Position: Sitting)   Pulse 88   Temp 97.8 F (36.6 C) (Temporal)   Ht 5\' 3"  (1.6 m)   Wt 164 lb (74.4 kg)   SpO2 97%   BMI 29.05 kg/m   BP Readings from Last 3 Encounters:  08/14/23 104/76  07/16/23 118/70  05/25/23 122/76    Wt Readings from  Last 3 Encounters:  08/14/23 164 lb (74.4 kg)  07/31/23 165 lb (74.8 kg)  07/16/23 171 lb (77.6 kg)    Physical Exam Constitutional:      General: She is not in acute distress.    Appearance: Normal appearance. She is well-developed.  HENT:     Head: Normocephalic.     Right Ear: External ear normal.     Left Ear: External ear normal.     Nose: Nose normal.  Eyes:     General:        Right eye: No discharge.        Left eye: No discharge.     Conjunctiva/sclera: Conjunctivae normal.     Pupils: Pupils are equal, round, and reactive to light.  Neck:     Thyroid: No thyromegaly.     Vascular: No JVD.     Trachea: No tracheal deviation.  Cardiovascular:     Rate and Rhythm: Normal rate and regular rhythm.     Heart sounds: Normal heart sounds.  Pulmonary:     Effort: No respiratory distress.     Breath sounds: No stridor. No wheezing.  Abdominal:     General: Bowel sounds are normal. There is no distension.     Palpations: Abdomen is soft. There is no mass.     Tenderness: There is no abdominal tenderness. There is no guarding or rebound.  Musculoskeletal:  General: No tenderness.     Cervical back: Normal range of motion and neck supple. No rigidity.  Lymphadenopathy:     Cervical: No cervical adenopathy.  Skin:    Findings: No erythema or rash.  Neurological:     Cranial Nerves: No cranial nerve deficit.     Motor: No abnormal muscle tone.     Coordination: Coordination normal.     Deep Tendon Reflexes: Reflexes normal.  Psychiatric:        Behavior: Behavior normal.        Thought Content: Thought content normal.        Judgment: Judgment normal.     Lab Results  Component Value Date   WBC 7.5 05/25/2023   HGB 15.3 (H) 05/25/2023   HCT 45.8 05/25/2023   PLT 297.0 05/25/2023   GLUCOSE 83 07/16/2023   CHOL 192 01/26/2023   TRIG 164.0 (H) 01/26/2023   HDL 49.70 01/26/2023   LDLDIRECT 160.0 01/02/2018   LDLCALC 109 (H) 01/26/2023   ALT 28 07/16/2023    AST 22 07/16/2023   NA 139 07/16/2023   K 4.1 07/16/2023   CL 105 07/16/2023   CREATININE 0.95 07/16/2023   BUN 19 07/16/2023   CO2 26 07/16/2023   TSH 1.34 07/16/2023   HGBA1C 5.3 11/17/2020    US Abdomen Complete Result Date: 12/24/2019 CLINICAL DATA:  Intermittent left upper quadrant pain. EXAM: ABDOMEN ULTRASOUND COMPLETE COMPARISON:  Abdominal ultrasound dated February 18, 2014. FINDINGS: Gallbladder: No gallstones or wall thickening visualized. No sonographic Murphy sign noted by sonographer. Common bile duct: Diameter: 4 mm, normal. Liver: Unchanged 2.8 cm simple cyst in the right liver. 1.2 cm simple cyst in the left liver, not seen on the prior study. Patchy increased parenchymal echogenicity. Portal vein is patent on color Doppler imaging with normal direction of blood flow towards the liver. IVC: No abnormality visualized. Pancreas: Visualized portion unremarkable. Spleen: Size and appearance within normal limits. Right Kidney: Length: 10.1 cm. Echogenicity within normal limits. No mass or hydronephrosis visualized. 1.7 cm simple cyst. Left Kidney: Length: 10.4 cm. Echogenicity within normal limits. No mass or hydronephrosis visualized. 1.5 cm simple cyst. Abdominal aorta: No aneurysm visualized. Other findings: None. IMPRESSION: 1. No acute abnormality. 2. Patchy increased parenchymal echogenicity of the liver, nonspecific but could represent steatosis or underlying hepatocellular disease. Correlate with LFTs. 3. SImple hepatic and bilateral renal cysts. Electronically Signed   By: Obie Dredge M.D.   On: 12/24/2019 16:40    Assessment & Plan:   Problem List Items Addressed This Visit     Anxiety - Primary    Xanax prn - very rare use  Potential benefits of a long term benzodiazepines  use as well as potential risks  and complications were explained to the patient and were aknowledged.      INSOMNIA, CHRONIC   No OSA      Essential hypertension   Low BP Reduce or d/c  Coreg On Spironolactone      Relevant Medications   carvedilol (COREG) 3.125 MG tablet   LOW BACK PAIN   Norco prn  Potential benefits of a long term opioids use as well as potential risks (i.e. addiction risk, apnea etc) and complications (i.e. Somnolence, constipation and others) were explained to the patient and were aknowledged.      Hypokalemia   On Spironolactone Check labs      Vitamin D deficiency   On Vit D      Fatigue   Watching  3 kids Low BP Reduce Spironolactone, Coreg      Snoring   No OSA         Meds ordered this encounter  Medications   carvedilol (COREG) 3.125 MG tablet    Sig: Take 1 tablet (3.125 mg total) by mouth 2 (two) times daily with a meal.    Dispense:  60 tablet    Refill:  3      Follow-up: No follow-ups on file.  Sonda Primes, MD

## 2023-08-14 NOTE — Assessment & Plan Note (Signed)
Norco prn  Potential benefits of a long term opioids use as well as potential risks (i.e. addiction risk, apnea etc) and complications (i.e. Somnolence, constipation and others) were explained to the patient and were aknowledged. 

## 2023-08-14 NOTE — Assessment & Plan Note (Addendum)
 Watching 3 kids Low BP Reduce Spironolactone, Coreg Low normal B12 - take B complex

## 2023-08-21 ENCOUNTER — Ambulatory Visit
Admission: RE | Admit: 2023-08-21 | Discharge: 2023-08-21 | Disposition: A | Discharge status: Home / Self Care | Source: Ambulatory Visit | Attending: Internal Medicine | Admitting: Internal Medicine

## 2023-08-21 DIAGNOSIS — Z1231 Encounter for screening mammogram for malignant neoplasm of breast: Secondary | ICD-10-CM

## 2023-08-21 DIAGNOSIS — Z780 Asymptomatic menopausal state: Secondary | ICD-10-CM

## 2023-10-16 ENCOUNTER — Encounter: Payer: Self-pay | Admitting: Internal Medicine

## 2023-10-16 ENCOUNTER — Ambulatory Visit: Admitting: Internal Medicine

## 2023-10-16 VITALS — BP 118/70 | HR 78 | Temp 98.1°F | Ht 63.0 in | Wt 159.0 lb

## 2023-10-16 DIAGNOSIS — G8929 Other chronic pain: Secondary | ICD-10-CM | POA: Diagnosis not present

## 2023-10-16 DIAGNOSIS — R5383 Other fatigue: Secondary | ICD-10-CM | POA: Diagnosis not present

## 2023-10-16 DIAGNOSIS — I10 Essential (primary) hypertension: Secondary | ICD-10-CM | POA: Diagnosis not present

## 2023-10-16 DIAGNOSIS — F5101 Primary insomnia: Secondary | ICD-10-CM | POA: Diagnosis not present

## 2023-10-16 DIAGNOSIS — M545 Low back pain, unspecified: Secondary | ICD-10-CM

## 2023-10-16 DIAGNOSIS — E559 Vitamin D deficiency, unspecified: Secondary | ICD-10-CM

## 2023-10-16 DIAGNOSIS — N182 Chronic kidney disease, stage 2 (mild): Secondary | ICD-10-CM

## 2023-10-16 MED ORDER — ZOLPIDEM TARTRATE 10 MG PO TABS: 5.0000 mg | ORAL_TABLET | Freq: Every evening | ORAL | 1 refills | Status: DC | PRN

## 2023-10-16 NOTE — Progress Notes (Signed)
 Subjective:  Patient ID: Dawn Howard, female    DOB: [birth year]  Age: 73 y.o. MRN: [id number]  CC: Medical Management of Chronic Issues (2 mnth f/u)   HPI Dawn Howard presents for fatigue, HTN, gout C/o insomnia  Outpatient Medications Prior to Visit  Medication Sig Dispense Refill   allopurinol  (ZYLOPRIM ) 100 MG tablet Take 0.5 tablets (50 mg total) by mouth daily. 90 tablet 3   b complex vitamins capsule Take 1 capsule by mouth daily. 100 capsule 3   carvedilol  (COREG ) 3.125 MG tablet Take 1 tablet (3.125 mg total) by mouth 2 (two) times daily with a meal. 60 tablet 3   HYDROcodone -acetaminophen  (NORCO) 7.5-325 MG tablet Take 1 tablet by mouth 2 (two) times daily as needed. Take 1 by twice a day as needed     MEGARED OMEGA-3 KRILL OIL PO Take by mouth.     spironolactone  (ALDACTONE ) 25 MG tablet Take 1 tablet (25 mg total) by mouth daily. 90 tablet 3   No facility-administered medications prior to visit.    ROS: Review of Systems  Constitutional:  Negative for activity change, appetite change, chills, fatigue and unexpected weight change.  HENT:  Negative for congestion, mouth sores and sinus pressure.   Eyes:  Negative for visual disturbance.  Respiratory:  Negative for cough and chest tightness.   Gastrointestinal:  Negative for abdominal pain and nausea.  Genitourinary:  Negative for difficulty urinating, frequency and vaginal pain.  Musculoskeletal:  Negative for back pain and gait problem.  Skin:  Negative for pallor and rash.  Neurological:  Negative for dizziness, tremors, weakness, numbness and headaches.  Psychiatric/Behavioral:  Positive for sleep disturbance. Negative for confusion and suicidal ideas. The patient is nervous/anxious.     Objective:  BP 118/70   Pulse 78   Temp 98.1 F (36.7 C) (Oral)   Ht 5\' 3"  (1.6 m)   Wt 159 lb (72.1 kg)   SpO2 98%   BMI 28.17 kg/m   BP Readings from Last 3 Encounters:  10/16/23 118/70  08/14/23 104/76   07/16/23 118/70    Wt Readings from Last 3 Encounters:  10/16/23 159 lb (72.1 kg)  08/14/23 164 lb (74.4 kg)  07/31/23 165 lb (74.8 kg)    Physical Exam Constitutional:      General: She is not in acute distress.    Appearance: Normal appearance. She is well-developed.  HENT:     Head: Normocephalic.     Right Ear: External ear normal.     Left Ear: External ear normal.     Nose: Nose normal.  Eyes:     General:        Right eye: No discharge.        Left eye: No discharge.     Conjunctiva/sclera: Conjunctivae normal.     Pupils: Pupils are equal, round, and reactive to light.  Neck:     Thyroid : No thyromegaly.     Vascular: No JVD.     Trachea: No tracheal deviation.  Cardiovascular:     Rate and Rhythm: Normal rate and regular rhythm.     Heart sounds: Normal heart sounds.  Pulmonary:     Effort: No respiratory distress.     Breath sounds: No stridor. No wheezing.  Abdominal:     General: Bowel sounds are normal. There is no distension.     Palpations: Abdomen is soft. There is no mass.     Tenderness: There is no abdominal tenderness. There is  no guarding or rebound.  Musculoskeletal:        General: No tenderness.     Cervical back: Normal range of motion and neck supple. No rigidity.     Right lower leg: No edema.     Left lower leg: No edema.  Lymphadenopathy:     Cervical: No cervical adenopathy.  Skin:    Findings: No erythema or rash.  Neurological:     Mental Status: She is oriented to person, place, and time.     Cranial Nerves: No cranial nerve deficit.     Motor: No abnormal muscle tone.     Coordination: Coordination normal.     Deep Tendon Reflexes: Reflexes normal.  Psychiatric:        Behavior: Behavior normal.        Thought Content: Thought content normal.        Judgment: Judgment normal.     Lab Results  Component Value Date   WBC 7.5 05/25/2023   HGB 15.3 (H) 05/25/2023   HCT 45.8 05/25/2023   PLT 297.0 05/25/2023   GLUCOSE 83  07/16/2023   CHOL 192 01/26/2023   TRIG 164.0 (H) 01/26/2023   HDL 49.70 01/26/2023   LDLDIRECT 160.0 01/02/2018   LDLCALC 109 (H) 01/26/2023   ALT 28 07/16/2023   AST 22 07/16/2023   NA 139 07/16/2023   K 4.1 07/16/2023   CL 105 07/16/2023   CREATININE 0.95 07/16/2023   BUN 19 07/16/2023   CO2 26 07/16/2023   TSH 1.34 07/16/2023   HGBA1C 5.3 11/17/2020    MM 3D SCREENING MAMMOGRAM BILATERAL BREAST Result Date: 08/23/2023 CLINICAL DATA:  Screening. EXAM: DIGITAL SCREENING BILATERAL MAMMOGRAM WITH TOMOSYNTHESIS AND CAD TECHNIQUE: Bilateral screening digital craniocaudal and mediolateral oblique mammograms were obtained. Bilateral screening digital breast tomosynthesis was performed. The images were evaluated with computer-aided detection. COMPARISON:  Previous exam(s). ACR Breast Density Category a: The breasts are almost entirely fatty. FINDINGS: There are no findings suspicious for malignancy. IMPRESSION: No mammographic evidence of malignancy. A result letter of this screening mammogram will be mailed directly to the patient. RECOMMENDATION: Screening mammogram in one year. (Code:SM-B-01Y) BI-RADS CATEGORY  1: Negative. Electronically Signed   By: Dru Georges M.D.   On: 08/23/2023 14:54   DG Bone Density Result Date: 08/22/2023 EXAM: DUAL X-RAY ABSORPTIOMETRY (DXA) FOR BONE MINERAL DENSITY 08/21/2023 12:11 pm CLINICAL DATA:  73 year old Female Postmenopausal. Screening for osteoporosis History of fragility fracture. TECHNIQUE: An axial (e.g., hips, spine) and/or appendicular (e.g., radius) exam was performed, as appropriate, using [employer] Secretary/administrator at [employer]. Images are obtained for bone mineral density measurement and are not obtained for diagnostic purposes. [id number] Exclusions: None. COMPARISON:  05/30/2018 FINDINGS: Scan quality: Good. LUMBAR SPINE (L1-L4): BMD (in g/cm2): 0.915 T-score: -2.2 Z-score: -0.5 Rate of change from previous exam: No significant  rate of change from previous exam. LEFT FEMORAL NECK: BMD (in g/cm2): 0.687 T-score: -2.5 Z-score: -0.6 LEFT TOTAL HIP: BMD (in g/cm2): 0.712 T-score: -2.3 Z-score: -0.6 RIGHT FEMORAL NECK: BMD (in g/cm2): 0.716 T-score: -2.3 Z-score: -0.4 RIGHT TOTAL HIP: BMD (in g/cm2): 0.757 T-score: -2.0 Z-score: -0.3 DUAL-FEMUR TOTAL MEAN: Rate of change from previous exam: -4.4% FRAX 10-YEAR PROBABILITY OF FRACTURE: FRAX not reported as the lowest BMD is not in the osteopenia range. IMPRESSION: Osteoporosis based on BMD. Fracture risk is increased. Increased risk is based on low BMD, and history of fragility fracture. RECOMMENDATIONS: 1. All patients should optimize calcium  and vitamin D   intake. 2. Consider FDA-approved medical therapies in postmenopausal women and men aged 31 years and older, based on the following: - A hip or vertebral (clinical or morphometric) fracture - T-score less than or equal to -2.5 and secondary causes have been excluded. - Low bone mass (T-score between -1.0 and -2.5) and a 10-year probability of a hip fracture greater than or equal to 3% or a 10-year probability of a major osteoporosis-related fracture greater than or equal to 20% based on the [place] -adapted WHO algorithm. - Clinician judgment and/or patient preferences may indicate treatment for people with 10-year fracture probabilities above or below these levels 3. Patients with diagnosis of osteoporosis or at high risk for fracture should have regular bone mineral density tests. For patients eligible for Medicare, routine testing is allowed once every 2 years. The testing frequency can be increased to one year for patients who have rapidly progressing disease, those who are receiving or discontinuing medical therapy to restore bone mass, or have additional risk factors. Electronically Signed   By: Dina  Arceo M.D.   On: 08/22/2023 10:48    Assessment & Plan:   Problem List Items Addressed This Visit     Insomnia   Zolpidem prn  Potential  benefits of a long term benzodiazepines  use as well as potential risks  and complications were explained to the patient and were aknowledged.       Essential hypertension   Low BP Reduce or d/c Coreg  On Spironolactone       LOW BACK PAIN   Vitamin D  deficiency   On Vit D      Fatigue - Primary   CRF (chronic renal failure), stage 2 (mild)   Follow-up with Dr. Higinio Love.  Hydrate well         Meds ordered this encounter  Medications   zolpidem (AMBIEN) 10 MG tablet    Sig: Take 0.5-1 tablets (5-10 mg total) by mouth at bedtime as needed for sleep.    Dispense:  30 tablet    Refill:  1      Follow-up: Return in about 3 months (around 01/16/2024) for a follow-up visit.  Anitra Barn, MD

## 2023-10-16 NOTE — Assessment & Plan Note (Signed)
 On Vit D

## 2023-10-16 NOTE — Assessment & Plan Note (Signed)
Follow-up with Dr. Glenna Fellows.  Hydrate well

## 2023-10-16 NOTE — Assessment & Plan Note (Signed)
 Low BP Reduce or d/c Coreg On Spironolactone

## 2023-10-16 NOTE — Assessment & Plan Note (Signed)
Zolpidem prn  Potential benefits of a long term benzodiazepines  use as well as potential risks  and complications were explained to the patient and were aknowledged. 

## 2024-01-22 ENCOUNTER — Encounter: Payer: Self-pay | Admitting: Internal Medicine

## 2024-01-22 ENCOUNTER — Ambulatory Visit: Admitting: Internal Medicine

## 2024-01-22 VITALS — BP 112/80 | HR 75 | Temp 98.6°F | Ht 61.5 in | Wt 167.0 lb

## 2024-01-22 DIAGNOSIS — F5101 Primary insomnia: Secondary | ICD-10-CM

## 2024-01-22 DIAGNOSIS — N182 Chronic kidney disease, stage 2 (mild): Secondary | ICD-10-CM

## 2024-01-22 DIAGNOSIS — K760 Fatty (change of) liver, not elsewhere classified: Secondary | ICD-10-CM | POA: Diagnosis not present

## 2024-01-22 DIAGNOSIS — K219 Gastro-esophageal reflux disease without esophagitis: Secondary | ICD-10-CM | POA: Diagnosis not present

## 2024-01-22 DIAGNOSIS — I10 Essential (primary) hypertension: Secondary | ICD-10-CM

## 2024-01-22 MED ORDER — HYDROCODONE-ACETAMINOPHEN 7.5-325 MG PO TABS: 1.0000 | ORAL_TABLET | Freq: Two times a day (BID) | ORAL | 0 refills | Status: AC | PRN

## 2024-01-22 MED ORDER — ZOLPIDEM TARTRATE 5 MG PO TABS: 2.5000 mg | ORAL_TABLET | Freq: Every evening | ORAL | 2 refills | Status: AC | PRN

## 2024-01-22 MED ORDER — ALLOPURINOL 100 MG PO TABS: 50.0000 mg | ORAL_TABLET | Freq: Every day | ORAL | 1 refills | Status: AC

## 2024-01-22 MED ORDER — CARVEDILOL 3.125 MG PO TABS: 3.1250 mg | ORAL_TABLET | Freq: Two times a day (BID) | ORAL | 3 refills | Status: AC

## 2024-01-22 MED ORDER — SPIRONOLACTONE 25 MG PO TABS: 25.0000 mg | ORAL_TABLET | Freq: Every day | ORAL | 3 refills | Status: AC

## 2024-01-22 NOTE — Assessment & Plan Note (Signed)
 Zolpidem  prn - rare use, small dose  Potential benefits of a long term benzodiazepines  use as well as potential risks  and complications were explained to the patient and were aknowledged.

## 2024-01-22 NOTE — Assessment & Plan Note (Signed)
 Cont w/wt loss effort Reduced carbs

## 2024-01-22 NOTE — Assessment & Plan Note (Signed)
 Low BP Reduce or d/c Coreg On Spironolactone

## 2024-01-22 NOTE — Assessment & Plan Note (Signed)
Dr Silverio Decamp  EGD 2022 Chronic  Re-start Protonix Pepcid

## 2024-01-22 NOTE — Progress Notes (Signed)
 Subjective:  Patient ID: Dawn Howard, female    DOB: 1951/02/05  Age: 73 y.o. MRN: [id number]  CC: Follow-up (Patient states she wants to go over her meds. Wants to discuss, Flu vaccine and Covid booster Vaccine. )   HPI Dawn Howard presents for LBP, HTN, dyslipidemia  Outpatient Medications Prior to Visit  Medication Sig Dispense Refill   b complex vitamins capsule Take 1 capsule by mouth daily. 100 capsule 3   MEGARED OMEGA-3 KRILL OIL PO Take by mouth.     allopurinol  (ZYLOPRIM ) 100 MG tablet Take 0.5 tablets (50 mg total) by mouth daily. 90 tablet 3   carvedilol  (COREG ) 3.125 MG tablet Take 1 tablet (3.125 mg total) by mouth 2 (two) times daily with a meal. 60 tablet 3   HYDROcodone -acetaminophen  (NORCO) 7.5-325 MG tablet Take 1 tablet by mouth 2 (two) times daily as needed. Take 1 by twice a day as needed     spironolactone  (ALDACTONE ) 25 MG tablet Take 1 tablet (25 mg total) by mouth daily. 90 tablet 3   zolpidem  (AMBIEN ) 10 MG tablet Take 0.5-1 tablets (5-10 mg total) by mouth at bedtime as needed for sleep. 30 tablet 1   No facility-administered medications prior to visit.    ROS: Review of Systems  Constitutional:  Positive for unexpected weight change. Negative for activity change, appetite change, chills and fatigue.  HENT:  Negative for congestion, mouth sores and sinus pressure.   Eyes:  Negative for visual disturbance.  Respiratory:  Negative for cough and chest tightness.   Gastrointestinal:  Negative for abdominal pain and nausea.  Genitourinary:  Negative for difficulty urinating, frequency and vaginal pain.  Musculoskeletal:  Positive for arthralgias and back pain. Negative for gait problem.  Skin:  Negative for pallor and rash.  Neurological:  Negative for dizziness, tremors, weakness, numbness and headaches.  Psychiatric/Behavioral:  Negative for confusion and sleep disturbance.     Objective:  BP 112/80   Pulse 75   Temp 98.6 F (37 C) (Oral)    Ht 5' 1.5 (1.562 m)   Wt 167 lb (75.8 kg)   SpO2 95%   BMI 31.04 kg/m   BP Readings from Last 3 Encounters:  01/22/24 112/80  10/16/23 118/70  08/14/23 104/76    Wt Readings from Last 3 Encounters:  01/22/24 167 lb (75.8 kg)  10/16/23 159 lb (72.1 kg)  08/14/23 164 lb (74.4 kg)    Physical Exam Constitutional:      General: She is not in acute distress.    Appearance: She is well-developed.  HENT:     Head: Normocephalic.     Right Ear: External ear normal.     Left Ear: External ear normal.     Nose: Nose normal.  Eyes:     General:        Right eye: No discharge.        Left eye: No discharge.     Conjunctiva/sclera: Conjunctivae normal.     Pupils: Pupils are equal, round, and reactive to light.  Neck:     Thyroid : No thyromegaly.     Vascular: No JVD.     Trachea: No tracheal deviation.  Cardiovascular:     Rate and Rhythm: Normal rate and regular rhythm.     Heart sounds: Normal heart sounds.  Pulmonary:     Effort: No respiratory distress.     Breath sounds: No stridor. No wheezing.  Abdominal:     General: Bowel sounds are  normal. There is no distension.     Palpations: Abdomen is soft. There is no mass.     Tenderness: There is no abdominal tenderness. There is no guarding or rebound.  Musculoskeletal:        General: Tenderness present.     Cervical back: Normal range of motion and neck supple. No rigidity.  Lymphadenopathy:     Cervical: No cervical adenopathy.  Skin:    Findings: No erythema or rash.  Neurological:     Cranial Nerves: No cranial nerve deficit.     Motor: No abnormal muscle tone.     Coordination: Coordination normal.     Deep Tendon Reflexes: Reflexes normal.  Psychiatric:        Behavior: Behavior normal.        Thought Content: Thought content normal.        Judgment: Judgment normal.   LS w/pain  Lab Results  Component Value Date   WBC 7.5 05/25/2023   HGB 15.3 (H) 05/25/2023   HCT 45.8 05/25/2023   PLT 297.0  05/25/2023   GLUCOSE 83 07/16/2023   CHOL 192 01/26/2023   TRIG 164.0 (H) 01/26/2023   HDL 49.70 01/26/2023   LDLDIRECT 160.0 01/02/2018   LDLCALC 109 (H) 01/26/2023   ALT 28 07/16/2023   AST 22 07/16/2023   NA 139 07/16/2023   K 4.1 07/16/2023   CL 105 07/16/2023   CREATININE 0.95 07/16/2023   BUN 19 07/16/2023   CO2 26 07/16/2023   TSH 1.34 07/16/2023   HGBA1C 5.3 11/17/2020    MM 3D SCREENING MAMMOGRAM BILATERAL BREAST Result Date: 08/23/2023 CLINICAL DATA:  Screening. EXAM: DIGITAL SCREENING BILATERAL MAMMOGRAM WITH TOMOSYNTHESIS AND CAD TECHNIQUE: Bilateral screening digital craniocaudal and mediolateral oblique mammograms were obtained. Bilateral screening digital breast tomosynthesis was performed. The images were evaluated with computer-aided detection. COMPARISON:  Previous exam(s). ACR Breast Density Category a: The breasts are almost entirely fatty. FINDINGS: There are no findings suspicious for malignancy. IMPRESSION: No mammographic evidence of malignancy. A result letter of this screening mammogram will be mailed directly to the patient. RECOMMENDATION: Screening mammogram in one year. (Code:SM-B-01Y) BI-RADS CATEGORY  1: Negative. Electronically Signed   By: Rosina Gelineau M.D.   On: 08/23/2023 14:54   DG Bone Density Result Date: 08/22/2023 EXAM: DUAL X-RAY ABSORPTIOMETRY (DXA) FOR BONE MINERAL DENSITY 08/21/2023 12:11 pm CLINICAL DATA:  73 year old Female Postmenopausal. Screening for osteoporosis History of fragility fracture. TECHNIQUE: An axial (e.g., hips, spine) and/or appendicular (e.g., radius) exam was performed, as appropriate, using GE Secretary/administrator at [employer]. Images are obtained for bone mineral density measurement and are not obtained for diagnostic purposes. [id number] Exclusions: None. COMPARISON:  05/30/2018 FINDINGS: Scan quality: Good. LUMBAR SPINE (L1-L4): BMD (in g/cm2): 0.915 T-score: -2.2 Z-score: -0.5 Rate of change from  previous exam: No significant rate of change from previous exam. LEFT FEMORAL NECK: BMD (in g/cm2): 0.687 T-score: -2.5 Z-score: -0.6 LEFT TOTAL HIP: BMD (in g/cm2): 0.712 T-score: -2.3 Z-score: -0.6 RIGHT FEMORAL NECK: BMD (in g/cm2): 0.716 T-score: -2.3 Z-score: -0.4 RIGHT TOTAL HIP: BMD (in g/cm2): 0.757 T-score: -2.0 Z-score: -0.3 DUAL-FEMUR TOTAL MEAN: Rate of change from previous exam: -4.4% FRAX 10-YEAR PROBABILITY OF FRACTURE: FRAX not reported as the lowest BMD is not in the osteopenia range. IMPRESSION: Osteoporosis based on BMD. Fracture risk is increased. Increased risk is based on low BMD, and history of fragility fracture. RECOMMENDATIONS: 1. All patients should optimize calcium  and vitamin D  intake. 2.  Consider FDA-approved medical therapies in postmenopausal women and men aged 14 years and older, based on the following: - A hip or vertebral (clinical or morphometric) fracture - T-score less than or equal to -2.5 and secondary causes have been excluded. - Low bone mass (T-score between -1.0 and -2.5) and a 10-year probability of a hip fracture greater than or equal to 3% or a 10-year probability of a major osteoporosis-related fracture greater than or equal to 20% based on the [place] -adapted WHO algorithm. - Clinician judgment and/or patient preferences may indicate treatment for people with 10-year fracture probabilities above or below these levels 3. Patients with diagnosis of osteoporosis or at high risk for fracture should have regular bone mineral density tests. For patients eligible for Medicare, routine testing is allowed once every 2 years. The testing frequency can be increased to one year for patients who have rapidly progressing disease, those who are receiving or discontinuing medical therapy to restore bone mass, or have additional risk factors. Electronically Signed   By: Dina  Arceo M.D.   On: 08/22/2023 10:48    Assessment & Plan:   Problem List Items Addressed This Visit     CRF  (chronic renal failure), stage 2 (mild)   Follow-up with Dr. Norine.  Hydrate well      Essential hypertension - Primary   Low BP Reduce or d/c Coreg  On Spironolactone       Relevant Medications   carvedilol  (COREG ) 3.125 MG tablet   spironolactone  (ALDACTONE ) 25 MG tablet   Fatty liver   Cont w/wt loss effort Reduced carbs      GERD   Dr Shila  EGD 2022 Chronic  Re-start Protonix  Pepcid       Insomnia   Zolpidem  prn - rare use, small dose  Potential benefits of a long term benzodiazepines  use as well as potential risks  and complications were explained to the patient and were aknowledged.         Meds ordered this encounter  Medications   zolpidem  (AMBIEN ) 5 MG tablet    Sig: Take 0.5-1 tablets (2.5-5 mg total) by mouth at bedtime as needed for sleep.    Dispense:  30 tablet    Refill:  2   carvedilol  (COREG ) 3.125 MG tablet    Sig: Take 1 tablet (3.125 mg total) by mouth 2 (two) times daily with a meal.    Dispense:  180 tablet    Refill:  3   HYDROcodone -acetaminophen  (NORCO) 7.5-325 MG tablet    Sig: Take 1 tablet by mouth 2 (two) times daily as needed. Take 1 by twice a day as needed    Dispense:  30 tablet    Refill:  0   allopurinol  (ZYLOPRIM ) 100 MG tablet    Sig: Take 0.5 tablets (50 mg total) by mouth daily.    Dispense:  90 tablet    Refill:  1   spironolactone  (ALDACTONE ) 25 MG tablet    Sig: Take 1 tablet (25 mg total) by mouth daily.    Dispense:  90 tablet    Refill:  3      Follow-up: Return in about 6 months (around 07/21/2024) for a follow-up visit.  Marolyn Noel, MD

## 2024-01-22 NOTE — Assessment & Plan Note (Signed)
Follow-up with Dr. Glenna Fellows.  Hydrate well

## 2024-01-27 ENCOUNTER — Encounter: Payer: Self-pay | Admitting: Internal Medicine

## 2024-02-25 ENCOUNTER — Ambulatory Visit: Payer: Self-pay | Admitting: Internal Medicine

## 2024-04-17 ENCOUNTER — Telehealth: Payer: Self-pay

## 2024-04-17 NOTE — Telephone Encounter (Signed)
 RN called patient to schedule 3 year recall colonoscopy, NO answer.  Voicemail left to call [facility] at her earliest convenience to schedule colonoscopy.

## 2024-05-19 ENCOUNTER — Telehealth: Payer: Self-pay

## 2024-05-19 NOTE — Telephone Encounter (Signed)
 Copied from CRM [id number]. Topic: General - Other >> May 19, 2024  9:48 AM Zebedee SAUNDERS wrote: Reason for CRM: Pt returning Eliza First, RN call please call pt at [phone].

## 2024-05-22 ENCOUNTER — Telehealth: Payer: Self-pay | Admitting: Internal Medicine

## 2024-05-22 NOTE — Telephone Encounter (Unsigned)
 Copied from CRM #[id number]. Topic: General - Other >> May 22, 2024  1:03 PM [name] wrote: Reason for CRM: Patient wants to know if its too late for her to get her flu shot. Patient also is requesting flu shot education. She can be reached at [phone].

## 2024-05-23 NOTE — Telephone Encounter (Signed)
 LDVM for the pt and was able to inform her that she can still get her flu vaccine if she wishes.

## 2024-05-26 ENCOUNTER — Telehealth: Payer: Self-pay

## 2024-05-26 NOTE — Telephone Encounter (Signed)
 Copied from CRM [id number]. Topic: General - Other >> May 23, 2024  4:40 PM Drema MATSU wrote: Reason for CRM: Patient stated that she wants a phone call if there are any sooner appts. >> May 23, 2024  4:45 PM Drema MATSU wrote: For 02/10 appt

## 2024-05-28 ENCOUNTER — Ambulatory Visit

## 2024-05-28 NOTE — Progress Notes (Cosign Needed Addendum)
 After obtaining consent, and per orders of Dr. Garald, injection of HDFLU given by Ronnald SHAUNNA Palms. Patient instructed to report any adverse reaction to me immediately.   Medical screening examination/treatment/procedure(s) were performed by non-physician practitioner and as supervising physician I was immediately available for consultation/collaboration.  I agree with above. Karlynn Garald, MD

## 2024-06-17 ENCOUNTER — Ambulatory Visit: Admitting: Internal Medicine

## 2024-07-21 ENCOUNTER — Ambulatory Visit: Admitting: Internal Medicine

## 2024-08-01 ENCOUNTER — Ambulatory Visit
